# Patient Record
Sex: Male | Born: 1940 | Race: White | Hispanic: No | Marital: Single | Smoking: Current every day smoker
Health system: Southern US, Community
[De-identification: ages and names within clinical notes are randomized; demographics above are authoritative.]

## PROBLEM LIST (undated history)

## (undated) DIAGNOSIS — R413 Other amnesia: Secondary | ICD-10-CM

## (undated) DIAGNOSIS — E785 Hyperlipidemia, unspecified: Secondary | ICD-10-CM

## (undated) DIAGNOSIS — R55 Syncope and collapse: Secondary | ICD-10-CM

## (undated) DIAGNOSIS — N400 Enlarged prostate without lower urinary tract symptoms: Secondary | ICD-10-CM

## (undated) DIAGNOSIS — J302 Other seasonal allergic rhinitis: Secondary | ICD-10-CM

## (undated) DIAGNOSIS — K219 Gastro-esophageal reflux disease without esophagitis: Secondary | ICD-10-CM

## (undated) DIAGNOSIS — I340 Nonrheumatic mitral (valve) insufficiency: Secondary | ICD-10-CM

## (undated) DIAGNOSIS — I639 Cerebral infarction, unspecified: Secondary | ICD-10-CM

## (undated) DIAGNOSIS — R4189 Other symptoms and signs involving cognitive functions and awareness: Secondary | ICD-10-CM

## (undated) DIAGNOSIS — R414 Neurologic neglect syndrome: Secondary | ICD-10-CM

## (undated) DIAGNOSIS — I739 Peripheral vascular disease, unspecified: Secondary | ICD-10-CM

## (undated) DIAGNOSIS — R569 Unspecified convulsions: Secondary | ICD-10-CM

## (undated) DIAGNOSIS — I482 Chronic atrial fibrillation, unspecified: Secondary | ICD-10-CM

## (undated) DIAGNOSIS — J449 Chronic obstructive pulmonary disease, unspecified: Secondary | ICD-10-CM

## (undated) DIAGNOSIS — S42309A Unspecified fracture of shaft of humerus, unspecified arm, initial encounter for closed fracture: Secondary | ICD-10-CM

## (undated) DIAGNOSIS — I10 Essential (primary) hypertension: Secondary | ICD-10-CM

## (undated) DIAGNOSIS — N189 Chronic kidney disease, unspecified: Secondary | ICD-10-CM

## (undated) HISTORY — DX: Gastro-esophageal reflux disease without esophagitis: K21.9

## (undated) HISTORY — DX: Hyperlipidemia, unspecified: E78.5

## (undated) HISTORY — DX: Syncope and collapse: R55

## (undated) HISTORY — DX: Unspecified convulsions: R56.9

## (undated) HISTORY — PX: FRACTURE SURGERY: SHX138

## (undated) HISTORY — DX: Cerebral infarction, unspecified: I63.9

## (undated) HISTORY — DX: Chronic kidney disease, unspecified: N18.9

## (undated) HISTORY — DX: Peripheral vascular disease, unspecified: I73.9

## (undated) HISTORY — DX: Unspecified fracture of shaft of humerus, unspecified arm, initial encounter for closed fracture: S42.309A

## (undated) HISTORY — DX: Chronic obstructive pulmonary disease, unspecified: J44.9

## (undated) HISTORY — DX: Other seasonal allergic rhinitis: J30.2

## (undated) HISTORY — DX: Other symptoms and signs involving cognitive functions and awareness: R41.89

## (undated) HISTORY — DX: Essential (primary) hypertension: I10

## (undated) HISTORY — DX: Benign prostatic hyperplasia without lower urinary tract symptoms: N40.0

---

## 2005-01-15 ENCOUNTER — Ambulatory Visit (HOSPITAL_COMMUNITY): Admission: RE | Admit: 2005-01-15 | Discharge: 2005-01-15 | Payer: Self-pay | Admitting: Family Medicine

## 2006-03-07 ENCOUNTER — Observation Stay (HOSPITAL_COMMUNITY): Admission: AD | Admit: 2006-03-07 | Discharge: 2006-03-08 | Payer: Self-pay | Admitting: Family Medicine

## 2006-03-07 ENCOUNTER — Ambulatory Visit: Payer: Self-pay | Admitting: Family Medicine

## 2006-10-19 HISTORY — PX: UPPER GASTROINTESTINAL ENDOSCOPY: SHX188

## 2006-10-19 HISTORY — PX: COLONOSCOPY: SHX174

## 2008-12-24 ENCOUNTER — Ambulatory Visit: Payer: Self-pay | Admitting: Family Medicine

## 2008-12-24 ENCOUNTER — Inpatient Hospital Stay (HOSPITAL_COMMUNITY): Admission: EM | Admit: 2008-12-24 | Discharge: 2008-12-27 | Payer: Self-pay | Admitting: Emergency Medicine

## 2008-12-25 ENCOUNTER — Ambulatory Visit: Payer: Self-pay | Admitting: Surgery

## 2008-12-25 ENCOUNTER — Encounter (INDEPENDENT_AMBULATORY_CARE_PROVIDER_SITE_OTHER): Payer: Self-pay | Admitting: Emergency Medicine

## 2009-01-02 ENCOUNTER — Encounter (INDEPENDENT_AMBULATORY_CARE_PROVIDER_SITE_OTHER): Payer: Self-pay | Admitting: Family Medicine

## 2009-03-22 ENCOUNTER — Encounter: Payer: Self-pay | Admitting: Family Medicine

## 2009-04-18 ENCOUNTER — Encounter: Payer: Self-pay | Admitting: Family Medicine

## 2010-09-15 ENCOUNTER — Inpatient Hospital Stay (HOSPITAL_COMMUNITY)
Admission: EM | Admit: 2010-09-15 | Discharge: 2010-09-19 | Payer: Self-pay | Source: Home / Self Care | Admitting: Emergency Medicine

## 2010-09-16 ENCOUNTER — Ambulatory Visit: Payer: Self-pay | Admitting: Family Medicine

## 2010-09-16 ENCOUNTER — Ambulatory Visit: Payer: Self-pay | Admitting: Cardiology

## 2010-09-17 ENCOUNTER — Encounter: Payer: Self-pay | Admitting: Family Medicine

## 2010-09-18 ENCOUNTER — Encounter: Payer: Self-pay | Admitting: Family Medicine

## 2010-09-19 ENCOUNTER — Encounter: Payer: Self-pay | Admitting: Internal Medicine

## 2010-09-25 ENCOUNTER — Ambulatory Visit: Payer: Self-pay | Admitting: Internal Medicine

## 2010-10-19 ENCOUNTER — Encounter: Payer: Self-pay | Admitting: Internal Medicine

## 2010-10-25 ENCOUNTER — Inpatient Hospital Stay: Payer: Self-pay | Admitting: Internal Medicine

## 2010-11-19 ENCOUNTER — Encounter: Payer: Self-pay | Admitting: Internal Medicine

## 2010-11-22 ENCOUNTER — Inpatient Hospital Stay: Payer: Self-pay | Admitting: *Deleted

## 2010-12-29 LAB — COMPREHENSIVE METABOLIC PANEL
ALT: 16 U/L (ref 0–53)
CO2: 27 mEq/L (ref 19–32)
Calcium: 8.3 mg/dL — ABNORMAL LOW (ref 8.4–10.5)
Creatinine, Ser: 1.02 mg/dL (ref 0.4–1.5)
GFR calc non Af Amer: >60 mL/min (ref >60)
Glucose, Bld: 84 mg/dL (ref 70–99)
Sodium: 137 mEq/L (ref 135–145)
Total Bilirubin: 0.7 mg/dL (ref 0.3–1.2)

## 2010-12-30 LAB — LIPID PANEL
LDL Cholesterol: 75 mg/dL (ref 0–99)
Total CHOL/HDL Ratio: 3.8 RATIO
VLDL: 22 mg/dL (ref 0–40)

## 2010-12-30 LAB — HEMOGLOBIN A1C: Mean Plasma Glucose: 94 mg/dL (ref <117)

## 2010-12-30 LAB — PHOSPHORUS: Phosphorus: 2.5 mg/dL (ref 2.3–4.6)

## 2010-12-30 LAB — HEPATIC FUNCTION PANEL
Albumin: 3.3 g/dL — ABNORMAL LOW (ref 3.5–5.2)
Alkaline Phosphatase: 42 U/L (ref 39–117)
Indirect Bilirubin: 0.9 mg/dL (ref 0.3–0.9)
Total Protein: 6.1 g/dL (ref 6.0–8.3)

## 2010-12-30 LAB — DRUGS OF ABUSE SCREEN W/O ALC, ROUTINE URINE
Amphetamine Screen, Ur: NEGATIVE
Benzodiazepines.: NEGATIVE
Methadone: NEGATIVE
Opiate Screen, Urine: NEGATIVE
Phencyclidine (PCP): NEGATIVE
Propoxyphene: NEGATIVE

## 2010-12-30 LAB — DIFFERENTIAL
Lymphocytes Relative: 16 % (ref 12–46)
Lymphs Abs: 1.1 10*3/uL (ref 0.7–4.0)
Monocytes Absolute: 0.7 10*3/uL (ref 0.1–1.0)
Monocytes Relative: 10 % (ref 3–12)
Neutro Abs: 5 10*3/uL (ref 1.7–7.7)

## 2010-12-30 LAB — CK TOTAL AND CKMB (NOT AT ARMC)
CK, MB: 4.6 ng/mL — ABNORMAL HIGH (ref 0.3–4.0)
Relative Index: 1.3 (ref 0.0–2.5)
Total CK: 349 U/L — ABNORMAL HIGH (ref 7–232)

## 2010-12-30 LAB — CBC
HCT: 44.4 % (ref 39.0–52.0)
HCT: 46.6 % (ref 39.0–52.0)
Hemoglobin: 16.1 g/dL (ref 13.0–17.0)
MCH: 34.3 pg — ABNORMAL HIGH (ref 26.0–34.0)
MCHC: 36.3 g/dL — ABNORMAL HIGH (ref 30.0–36.0)
RDW: 12.7 % (ref 11.5–15.5)
RDW: 12.7 % (ref 11.5–15.5)
WBC: 6.9 10*3/uL (ref 4.0–10.5)

## 2010-12-30 LAB — BASIC METABOLIC PANEL
BUN: 11 mg/dL (ref 6–23)
BUN: 5 mg/dL — ABNORMAL LOW (ref 6–23)
CO2: 21 mEq/L (ref 19–32)
Calcium: 8.1 mg/dL — ABNORMAL LOW (ref 8.4–10.5)
Creatinine, Ser: 1.05 mg/dL (ref 0.4–1.5)
GFR calc Af Amer: >60 mL/min (ref >60)
GFR calc non Af Amer: >60 mL/min (ref >60)
GFR calc non Af Amer: >60 mL/min (ref >60)
Glucose, Bld: 71 mg/dL (ref 70–99)
Potassium: 3.5 mEq/L (ref 3.5–5.1)
Sodium: 140 mEq/L (ref 135–145)

## 2010-12-30 LAB — URINE MICROSCOPIC-ADD ON

## 2010-12-30 LAB — COMPREHENSIVE METABOLIC PANEL
Albumin: 3.8 g/dL (ref 3.5–5.2)
Alkaline Phosphatase: 47 U/L (ref 39–117)
BUN: 11 mg/dL (ref 6–23)
Calcium: 8.8 mg/dL (ref 8.4–10.5)
Creatinine, Ser: 1.23 mg/dL (ref 0.4–1.5)
Potassium: 3.3 mEq/L — ABNORMAL LOW (ref 3.5–5.1)
Total Protein: 6.7 g/dL (ref 6.0–8.3)

## 2010-12-30 LAB — RPR: RPR Ser Ql: NONREACTIVE

## 2010-12-30 LAB — VITAMIN B12: Vitamin B-12: 185 pg/mL — ABNORMAL LOW (ref 211–911)

## 2010-12-30 LAB — TSH: TSH: 1.148 u[IU]/mL (ref 0.350–4.500)

## 2010-12-30 LAB — FOLATE RBC: RBC Folate: 339 ng/mL (ref 180–600)

## 2010-12-30 LAB — URINALYSIS, ROUTINE W REFLEX MICROSCOPIC
Glucose, UA: NEGATIVE mg/dL
Leukocytes, UA: NEGATIVE
Specific Gravity, Urine: 1.024 (ref 1.005–1.030)

## 2010-12-30 LAB — PREALBUMIN: Prealbumin: 14 mg/dL — ABNORMAL LOW (ref 18.0–45.0)

## 2010-12-30 LAB — MAGNESIUM: Magnesium: 1.9 mg/dL (ref 1.5–2.5)

## 2011-01-29 LAB — COMPREHENSIVE METABOLIC PANEL
Albumin: 3.8 g/dL (ref 3.5–5.2)
BUN: 6 mg/dL (ref 6–23)
Calcium: 8.6 mg/dL (ref 8.4–10.5)
Glucose, Bld: 74 mg/dL (ref 70–99)
Potassium: 3.8 mEq/L (ref 3.5–5.1)
Sodium: 132 mEq/L — ABNORMAL LOW (ref 135–145)
Total Protein: 6.7 g/dL (ref 6.0–8.3)

## 2011-01-29 LAB — BASIC METABOLIC PANEL
BUN: 9 mg/dL (ref 6–23)
CO2: 24 mEq/L (ref 19–32)
CO2: 27 mEq/L (ref 19–32)
Calcium: 8.3 mg/dL — ABNORMAL LOW (ref 8.4–10.5)
Chloride: 104 mEq/L (ref 96–112)
Creatinine, Ser: 1.38 mg/dL (ref 0.4–1.5)
GFR calc Af Amer: >60 mL/min (ref >60)
GFR calc non Af Amer: 51 mL/min — ABNORMAL LOW (ref >60)
GFR calc non Af Amer: 51 mL/min — ABNORMAL LOW (ref >60)
Glucose, Bld: 139 mg/dL — ABNORMAL HIGH (ref 70–99)
Potassium: 3.4 mEq/L — ABNORMAL LOW (ref 3.5–5.1)
Sodium: 133 mEq/L — ABNORMAL LOW (ref 135–145)

## 2011-01-29 LAB — CBC
HCT: 52.4 % — ABNORMAL HIGH (ref 39.0–52.0)
Hemoglobin: 18.9 g/dL — ABNORMAL HIGH (ref 13.0–17.0)
MCHC: 35.6 g/dL (ref 30.0–36.0)
MCHC: 35.8 g/dL (ref 30.0–36.0)
MCHC: 36 g/dL (ref 30.0–36.0)
MCV: 97.7 fL (ref 78.0–100.0)
Platelets: 128 10*3/uL — ABNORMAL LOW (ref 150–400)
RBC: 4.46 MIL/uL (ref 4.22–5.81)
RBC: 4.86 MIL/uL (ref 4.22–5.81)
RDW: 12.8 % (ref 11.5–15.5)
RDW: 12.9 % (ref 11.5–15.5)
RDW: 13.1 % (ref 11.5–15.5)

## 2011-01-29 LAB — DIFFERENTIAL
Lymphs Abs: 1.2 10*3/uL (ref 0.7–4.0)
Monocytes Absolute: 0.4 10*3/uL (ref 0.1–1.0)
Monocytes Relative: 7 % (ref 3–12)
Neutro Abs: 3.9 10*3/uL (ref 1.7–7.7)
Neutrophils Relative %: 70 % (ref 43–77)

## 2011-01-29 LAB — RAPID URINE DRUG SCREEN, HOSP PERFORMED
Amphetamines: NOT DETECTED
Benzodiazepines: NOT DETECTED
Cocaine: NOT DETECTED
Opiates: NOT DETECTED
Tetrahydrocannabinol: NOT DETECTED

## 2011-01-29 LAB — TSH: TSH: 1.323 u[IU]/mL (ref 0.350–4.500)

## 2011-01-29 LAB — ETHANOL: Alcohol, Ethyl (B): <5 mg/dL (ref 0–10)

## 2011-01-29 LAB — LIPID PANEL
Cholesterol: 129 mg/dL (ref 0–200)
HDL: 29 mg/dL — ABNORMAL LOW (ref >39)
LDL Cholesterol: 74 mg/dL (ref 0–99)
Triglycerides: 130 mg/dL (ref <150)

## 2011-01-29 LAB — AMYLASE: Amylase: 93 U/L (ref 27–131)

## 2011-03-03 NOTE — H&P (Signed)
Ian Townsend, Ian Townsend                 ACCOUNT NO.:  [id number]   MEDICAL RECORD NO.:  [id number]          PATIENT TYPE:  INP   LOCATION:  1845                         FACILITY:  [facility]   PHYSICIAN:  Pearlean Brownie, M.D.DATE OF BIRTH:  Aug 06, 1941   DATE OF ADMISSION:  12/24/2008  DATE OF DISCHARGE:                              HISTORY & PHYSICAL   CHIEF COMPLAINT:  Slurred speech.   PRIMARY CARE PHYSICIAN:  [place].   HISTORY OF PRESENT ILLNESS:  This is a 70 year old male with a history  of TIAs in the past, hypertension, questionable depression and anemia  who presents with a 1-day history of slurred speech and left facial  droop and drooling.  He was noted to be normal at baseline on Saturday  when his daughter saw him.  Today, at 11:30 a.m., he called his son and  could not reach him.  His son called him back about a minute later and  noted that he was speaking in a slur like he had just woken from a deep  slumber, although he states he was awake.  The daughter came to his  house and noted the same, so he was brought to the ED for evaluation.  The patient had a longstanding history of dysequilibrium since his  previous TIAs 3-years ago.  He had seven TIAs total.  Per family, he  underwent a complete workup at [facility], but there are no records  available to me on E chart.  He denies recent fevers, chills, nausea,  vomiting, diarrhea, constipation, abdominal pain, chest pain, shortness  of breath, myalgias, arthralgias, changes in urination, loss of  consciousness or syncope.  He does endorse chronic cough, but he is a  smoker and there is no acute change in that.  He also endorses chronic  dizziness.   REVIEW OF SYSTEMS:  As above, otherwise negative.   PAST MEDICAL HISTORY:  1. Anemia.  2. Hypertension.  3. 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  4. Questionable history of depression.  5. History of smoking.  6. History of ankle fracture, status post repair in 1984.   MEDICATIONS:  Please note, the patient has stopped taking all his meds  for the last year as he has not had any follow up for the last year, so  he ran out.  Previously he was on;  1. Atenolol 50 mg b.i.d.  2. Omeprazole 20 mg daily.  3. Norvasc 10 mg daily.  4. Lisinopril 40 mg daily.  5. Doxazosin 2 mg daily.   ALLERGIES:  PENICILLIN CAUSES SWELLING.   FAMILY HISTORY:  Significant for mother with stroke and diabetes.  His  father passed of a heart attack.   SOCIAL HISTORY:  The patient does smoke 6 cigars per day and has smoked  for greater than 50 years.  He is not interested in quitting.  He does  also endorse 6 beers a day and not interested in cutting back.  Denies  recreational drugs.  He lives alone in [place].  Per family, the patient  has been less interactive and they feel that  he may be depressed.   PHYSICAL EXAMINATION:  VITAL SIGNS:  Temperature 99.4, heart rate 96-  120, blood pressure 225/120 and then dropped to 181/95 on its own,  respiratory rate 20.  O2 saturation 98% on 2 liters.  GENERAL:  A Caucasian male who appears older than stated age and is  disheveled, difficult to understand with some slurring speech.  HEENT:  Moist mucous membranes.  Pupils equally round and reactive to  light.  Extraocular movements intact.  No pharyngeal erythema, edema or  exudates.  Annia Friendly which is disheveled.  NECK:  No lymphadenopathy.  CARDIOVASCULAR:  Normal S1-S2.  No murmurs, rubs or gallops.  LUNGS:  Clear to auscultation bilaterally, except slight expiratory  wheezing throughout.  ABDOMEN:  Soft, nontender, nondistended.  Normoactive bowel sounds.  No  masses, no hepatosplenomegaly noted.  EXTREMITIES:  No clubbing, cyanosis or edema.  2+ peripheral pulses.  SKIN:  No rash or jaundice noted.  NEUROLOGY:  Cranial nerves II through XII grossly intact.  Slight left  facial droop along with drooling and slurred speech which has continued  per family, but I am unsure of  baseline.  Motor function intact  bilaterally, 5/5 strength.  Sensation intact bilaterally.  Gait not  assessed.   LABORATORY DATA:  In the ER, alcohol level less than 5.  Urine drug  screen negative.  Sodium 132, potassium 3.8, chloride 49, bicarb 24, BUN  6, creatinine 1.25, glucose 74.  LFTs within normal limits, except for a  total bilirubin of 1.4 with calcium 8.6.  Hemoglobin 18.9, white blood  cells 5.6, platelets 128.  Point of care of enzymes are negative x1 in  the ER.  Chest x-ray showing no acute changes, although it does state  focal density of lateral right lung base which may represent nipple  shadow and they recommend considering further evaluation with two-view.   Head CT with stable age-related atrophy and progressive periventricular  white matter disease which is likely microvascular ischemic changes.  There is also evidence of probable lacunar infarcts in the right basal  ganglia and right thalamus.  EKG - sinus tachy with no acute ischemic  changes.  Tele at the ER showing 4 beats of V-tach.   ASSESSMENT/PLAN:  This is a 70 year old male with history of previous  transient ischemic attacks, hypertension, questionable depression and  anemia who presents with a 1-day history of slurred speech and left  facial droop.   1. Slurred speech.  The patient presents outside a window for      thrombolytics.  CT also without evidence of acute infarct.  Hold      off on MRI for now as it will not likely will change acute workup      or treatment.  We will discuss with the team in the morning.  Check      echo and carotid Dopplers.  Risk stratify with TSH and fasting      lipid panel.  Permissive hypertension for now with a systolic blood      pressure of 160-180.  Slowly restart antihypertensives as the days      go on.  Consider neurology consult in the morning.  Aspirin 325 mg      daily, beta-blocker and ACE inhibitor, all with hold parameters.      IV hydralazine as  well.  2. History of anemia.  Hemoglobin today 18.9, likely secondary to      smoking, polycythemia.  Rehydrate slowly and monitor platelets as  they are somewhat low now.  The patient does have history of      transfusions in the past for anemia and history of being on iron      therapy.  3. Depression.  Mr. Quast seems obviously depressed.  Per family, he is      anhedonic, sleeping all day, disheveled, poor effort in responses.      He has had a trial of Effexor with poor response.  The patient is      not currently interested in any anti-depressive therapy.  4. Hypertension.  Permissive hypertension for now as we rule out      cerebrovascular accident.  Last blood pressure 160-180, slowly      restart blood pressure medications p.o.  5. History of smoking.  Nicotine patch precontemplative.  6. Alcohol abuse.  No history of withdrawal.  For now, will just give      Ativan p.r.n. withdrawal.  The patient does have decreased      platelets but his liver function tests are normal and no      hepatomegaly noted today.  Monitor daily for withdrawal.  7. Fluids, electrolytes, and nutrition/gastrointestinal:  IV fluid      rehydration with D5-1/2 normal saline at 125 mL per hour overnight.      Reassess in the morning.  Bedside swallow and a heart-healthy diet.  8. Prophylaxis; Lovenox and Protonix.  9. Disposition:  Physical therapy/occupational therapy consult.      Monitor in the stepdown unit for close blood pressure follow-up and      neuro checks tonight.  Likely transfer to floor in the morning.  10.Code:  Full code if deemed reversible per his wishes.      Eustaquio Boyden, MD  Electronically Signed      Pearlean Brownie, M.D.  Electronically Signed    JG/MEDQ  D:  12/24/2008  T:  12/25/2008  Job:  [id number]

## 2011-03-03 NOTE — Discharge Summary (Signed)
Ian Townsend, Ian Townsend                 ACCOUNT NO.:  [id number]   MEDICAL RECORD NO.:  [id number]          PATIENT TYPE:  INP   LOCATION:  3728                         FACILITY:  [facility]   PHYSICIAN:  Pearlean Brownie, M.D.DATE OF BIRTH:  Mar 15, 1941   DATE OF ADMISSION:  12/24/2008  DATE OF DISCHARGE:  12/27/2008                               DISCHARGE SUMMARY   ADMISSION DIAGNOSES:  1. Slurred speech and left facial droop.  2. Hypertension.  3. Depression.  4. History of smoking.  5. Ethyl alcohol abuse.  6. Chronic kidney disease.   DISCHARGE DIAGNOSES:  1. Transient ischemic attack.  2. Hypertension.  3. Depression.  4. History of smoking.  5. Ethyl alcohol abuse.  6. Chronic kidney disease.   CONSULTS DURING ADMISSION:  None.   PROCEDURES DONE DURING ADMISSION:  The patient had a CT head without  contrast that showed stable age-related cerebral atrophy; progressive  periventricular white matter disease, which is likely microvascular  ischemic change, probable remote lacunar-type infarct in the right basal  ganglia region, which was new since the prior study in 2006.  No CT  findings for hemispheric infarction or intracranial hemorrhage.  The  patient had a chest x-ray also on the date of admission that showed no  evidence of acute pneumonia or other explanation for altered mental  status.  Focal density at the lateral right lung base, which may  represent summation shadow or even nipple shadow and small suspect  hiatal hernia.   HISTORY:  Briefly, this is a 70 year old male with a history of TIAs in  the past, hypertension, questionable depression and anemia who presented  with a 1-day history of slurred speech and left facial droop, and  drooling.  He was last noted to be normal at baseline on Saturday prior  to admission when the daughter saw him.  On the day of admission, at  11:30 a.m. the patient called his son and could not reach him.  The son  called back about a  minute later and noted that the patient was speaking  in a slur like, he had just woken from a deep sleep, although he states  that he was awake.  Daughter came to the patient's house, noted the  same, so he was brought to the ED for evaluation.  The patient had a  longstanding history of disequilibrium since his previous TIAs 3 years  ago.  He has had a total of 7 TIA.  Per the patient's family, he  underwent a complete workup at [facility], but there were no records  available to check on E-chart.  The patient denies any fevers, chills,  nausea, vomiting, diarrhea, constipation, abdominal pain, chest pain,  shortness of breath, myalgias, arthralgias, changes in urination, loss  of consciousness, or syncope.  He did endorse a chronic cough, but the  patient is a smoker and there is no acute change in that, also endorsed  chronic dizziness.   HOSPITAL COURSE:  1. Slurred speech.  The patient was out of the window for      thrombolytics CT without evidence  of an acute infarct.  We decided      hold off on the MRI and that would likely not change the acute      workup.  The patient did have carotid Dopplers that were negative      for clots or stenosis and had an echocardiogram that showed an      ejection fraction of 60% and no evidence of clot or embolic origin.      The patient was risk stratified with a TSH and fasting lipid panel;      TSH was 1.323, fasting lipid panel showed total cholesterol 129,      HDL 29, and LDL of 74.  When the patient's blood pressure was      elevated significantly to 225/128 at admission, we decided to      slowly restart his antihypertensive and allow permissive      hypertension of 160-180, has not to widen the penumbra of the      patient's infarct.  The patient was also started on aspirin 325 mg,      a beta-blocker, an ACE inhibitor, and p.r.n. hydralazine, which he      did not need.  Pressures remained nicely between in the 160s and on      the day  after admission, his left facial droop resolved.  His      slurred speech resolved significantly and he was left with some      left-sided weakness about 4/5 on the left side, 5/5 on the right      with a history of the right basilar ganglia infarct.  We attribute      this to his chronic weakness.  Physical therapy saw the patient and      felt that he was very unstable and could benefit from this tubular      rehab with home health PT.  This was arranged for the patient.      Though he did admit to smoking approximately 6 cigars per day and      drinking 6 beers per day, the patient endorsed that he was not      interested in cutting back on either.  He did not have any      withdrawal symptoms during the hospitalization.  Though, he had an      episode of confusion the night he was admitted, but then the second      night in the hospital he did fine.  2. Hypertension.  We will allow permissive hypertension with blood      pressures around 160, slowly reinstituted blood pressure      medications.  By discharge, his pressures were in the 150 systolic.      I will restart all of his blood pressure meds as he is discharged      and blood pressure control will be deferred to outpatient      management.  For history of smoking, the patient was given a      nicotine patch with alcohol abuse, there was no signs of withdrawal      throughout his admission.  He did have some tremulousness on the      first day in the hospital, but this resolved.  3. Code status.  The patient was set to be full code (as long as his      condition was deemed reversible) per his wishes.   DISCHARGE CONDITION:  Stable and improved.   DISCHARGE  DECISION:  To home.   FOLLOWUP:  Follow up will be with Dr. Lockie Pares, at the [facility], their number is [phone], the appointment will be on January 25, 2009, at 3:00 p.m.   DISCHARGE MEDICATIONS:  Aspirin 81 mg p.o. daily, prescription was given  for this;  omeprazole 20 mg p.o. daily, prescription was given for this;  Norvasc 10 mg daily, prescription was given for this; lisinopril 40 mg  daily, a prescription was given; doxazosin 2 mg daily; metoprolol 50 mg  p.o. b.i.d., prescription was given for this.  The  patient is also given a rolling walker to take home and instructed to  keep himself well hydrated and the patient will need to have his  creatinine checked at the PCPs office as an outpatient as his creatinine  on admission was 1.25 and then on first day in the hospital increased to  1.38, and then remained at 1.38 for the next day.      Rodney Langton, MD  Electronically Signed      Pearlean Brownie, M.D.  Electronically Signed    TT/MEDQ  D:  12/27/2008  T:  12/28/2008  Job:  [id number]   cc:   [facility], [place]

## 2011-03-06 NOTE — H&P (Signed)
Ian Townsend, Ian Townsend                 ACCOUNT NO.:  [id number]   MEDICAL RECORD NO.:  [id number]          PATIENT TYPE:  INP   LOCATION:  3016                         FACILITY:  [facility]   PHYSICIAN:  Dwana Curd. Para March, M.D. DATE OF BIRTH:  Jan 17, 1941   DATE OF ADMISSION:  03/07/2006  DATE OF DISCHARGE:                                HISTORY & PHYSICAL   PRIMARY CARE PHYSICIAN:  Tracey Harries, MD, at Urgent Care.   CHIEF COMPLAINT:  Decreased hemoglobin.  The patient was transferred from  Urgent Care for evaluation and treatment.   HISTORY OF PRESENT ILLNESS:  The patient is a 70 year old male with past  medical history outlined below with progressive weakness and dizziness for  approximately one year.  The patient states this all began about a year ago  when he had a series of TIAs; however, he and his family agree that his  symptoms have all increased markedly over the last two months.  He is now  perceptively weak with presyncopal symptoms with and without position  change.  He has nausea and vomiting secondary to the presyncope.  He has had  no chest pain, no shortness of breath.  He has noted no blood in his  vomitus.  He has had a BM every day or every other day.  He has noted no  blood in the stool and no dark stools.  He has had no abdominal pain, no  weight loss, no fevers, chills, or night sweats.  He went to Urgent Care  today and was found to have a hemoglobin, which had dropped from 16 at last  measurement to 6.  This drop had occurred over a one year period.  He came  to [facility] for transfusion and likely a GI workup after discharge, per  the primary MD.   PAST MEDICAL HISTORY:  1.  Hypertension.  2.  History of TIA.  3.  History of ankle fracture status post surgical repair in 1984.  4.  No history of GI bleed with no recent NSAID use and no history of colon      cancer.  5.  Possible history of depression.   SOCIAL HISTORY:  The patient smokes seven cigars a day.   He drinks three to  four beers a day.  He has no history of withdrawal and no history of DTs.  He uses no illicit drugs.  He has three children, he is divorced, and he is  a retired Paediatric nurse.   FAMILY HISTORY:  His mother is dead at age 20.  She had a CVA and she also  had diabetes.  His father died at 72 of a heart attack.   MEDICATIONS:  1.  Norvasc 10 mg p.o. daily.  2.  Lisinopril 40 mg p.o. daily.  3.  Aspirin 325 mg p.o. daily.  4.  Doxazosin 10 mg p.o. daily.  5.  Effexor 75 mg p.o. daily, which is being weaned down to 37.5 mg p.o.      daily.  6.  Atenolol 50 mg p.o. b.i.d.  He has taken  all of his morning medications.  He has not taken his second  dose of Atenolol today.   ALLERGIES:  PENICILLIN, which causes swelling.   REVIEW OF SYSTEMS:  As above, otherwise are noncontributory except for an  occasional nighttime itching.  This is generalized and is not every night.   PHYSICAL EXAMINATION:  VITAL SIGNS:  98.6, blood pressure 112/64, pulse 78,  respiratory rate 20, SpO2 97% on room air.  GENERAL:  The patient is in no apparent distress.  He is pale and non-toxic-  appearing.  He is pleasant in conversation.  He is alert and oriented.  HEENT:  Normocephalic, atraumatic.  Extraocular movements are intact.  Pupils are equally round and reactive to light.  Mucous membranes are moist.  NECK:  Supple.  No lymphadenopathy appreciated.  There is no bruit on the  carotids bilaterally.  CARDIOVASCULAR:  Regular rate and rhythm with a normal S1 and a normal S2.  He does have a systolic ejection murmur, which is 1/6 consistent with a flow  murmur.  RESPIRATORY:  Clear to auscultation bilaterally.  ABDOMEN:  Soft and nontender, nondistended, positive bowel sounds, no  masses.  EXTREMITIES:  No edema, 2+ pulses.  BACK:  No CVA tenderness.  RECTAL:  Deferred.  He was heme-negative at Urgent Care.  NEURO:  Cranial nerves II-XII are intact bilaterally.  Sensation is intact   bilaterally on the upper and lower extremities.  He has slight decrease in  right upper extremity strength compared to the left at the biceps and  triceps.  Strength is equal in the bilateral lower extremities.  I did not  test his gait because of his presyncopal sensations.   LABORATORY VALUES TODAY:  His CMET, iron labs, and type-and-cross were all  pending.  His CBC at Urgent Care had showed the following:  White count of  6, hemoglobin 6.4, hematocrit 21.4, platelets 174.   ASSESSMENT AND PLAN:  The patient is a 70 year old male with the following  problems:  1.  A significant decrease in hemoglobin:  We will type and cross him now      for four units of packed red blood cells and transfuse him with two      initially and then check a CBC.  I will check iron labs and a CMET      before he is transfused and check a hemoccult on all the stools.  If he      is stable, he may be able to be discharged soon and have an outpatient      workup.  In the meantime, we will give him Protonix b.i.d. as a      gastrointestinal source is the most likely with diverticulitis versus      arteriovenous malformation versus cancer versus peptic ulcer disease all      being in the differential.  I will go ahead and check a CBC in the a.m.      In the meantime, he needs to avoid nonsteroidal antiinflammatories and      aspirin.  We will put him on fall precautions because of his presyncope.  2.  Hypertension:  Hold his p.m. medications as his blood pressure is low,      and we will follow his blood pressure and add on to his home medications      tomorrow morning as this is appropriate.  3.  Questionable history of depression:  The patient is stable, he has no  suicidal nor homicidal ideation.  We will taper his SSR per his primary      medical doctor.  4.  Tobacco:  I will give the patient a nicotine replacement, see orders. 5.  Deep venous thrombosis prophylaxis:  Sequential compression device, the       patient should not have any type of anticoagulation right now.  6.  Diet/FEN:  As the patient is having no belly pain and is non-toxic, I      think it is reasonable to put him on a heart-healthy diet and advance      this from clears as tolerated.  I will check a CMET for his      electrolytes.  7.  Itching:  We will check a liver function panel along with a CMET.  8.  History of alcohol use:  I will monitor the patient for signs and      symptoms of withdrawal.   DISPOSITION:  Admit.  The patient is a Full Code right now.  I have ordered  for Advanced Directives to be given to the patient.  The family is aware of  this.      Dwana Curd Para March, M.D.     GSD/MEDQ  D:  03/07/2006  T:  03/07/2006  Job:  [id number]   cc:   Tracey Harries, M.D.  Fax: [phone]

## 2011-03-06 NOTE — Discharge Summary (Signed)
NAMEALEISTER, LADY NO.:  [id number]   MEDICAL RECORD NO.:  [id number]          PATIENT TYPE:  OBV   LOCATION:  3016                         FACILITY:  [facility]   PHYSICIAN:  Asencion Partridge, M.D.     DATE OF BIRTH:  Dec 30, 1940   DATE OF ADMISSION:  03/07/2006  DATE OF DISCHARGE:  03/08/2006                                 DISCHARGE SUMMARY   PROBLEM LIST:  1.  Anemia.  2.  Hypertension.  3.  History of transient ischemic attack.  4.  Questionable history of depression.  5.  History of tobacco use.  6.  Possible chronic kidney disease.   DISCHARGE MEDICATIONS:  1.  Atenolol 50 mg p.o. b.i.d.  2.  Effexor 37.5 mg p.o. daily.  3.  Nicotine patch 14 mg per 24 hours.  4.  Omeprazole 20 mg p.o. daily.  5.  Iron sulfate 325 mg p.o. daily.  6.  Colace 100 mg p.o. daily.  7.  Norvasc 10 mg p.o. daily.  8.  Lisinopril 40 mg p.o. daily.  9.  Doxazosin 2 mg p.o. daily.   Note that the patient was instructed not to take his Norvasc, lisinopril and  doxazosin until he follows up with his primary.  The patient was informed not to take aspirin, Advil, Motrin or Aleve.  He  understands this.   FAMILY HISTORY:  The patient is to follow up with Dr. Everlene Other tomorrow on the  22nd.   PROCEDURES AND DIAGNOSTIC STUDIES:  The patient was transfused 2 units of  packed red blood cells.   CONSULTANTS:  None.   ADMISSION HISTORY AND PHYSICAL:  The patient is a pleasant 70 year old male  who has a history of hypertension and TIAs, but no history of weight loss  nor previous GI bleed, who presented with increasing fatigue and presyncopal  symptoms.  He was found to have a hemoglobin of 6 at Urgent Care.  He was  also found to be heme-positive as an outpatient there.  He was admitted for  transfusion.  He was admitted for management.   HOSPITAL COURSE:  PROBLEM #1 - ANEMIA:  The patient had iron deficiency  anemia based on his ferritin of 6 and his total iron of less than 10.   He  was typed and crossed and transfused 2 units of packed red blood cells with  a good response.  His hemoglobin increased from about 6 to 8.9; it remained  stable at this level.  He felt significantly better.  He had significantly  less presyncopal symptoms and he had no documented orthostasis.  Per the  request of the primary and the patient, he will have a workup for blood loss  as an outpatient.  Note that his fecal occult blood test was negative again  in the hospital.  Given his age, it is most likely that the patient has a  slow GI loss.   PROBLEM #2 - HYPERTENSION:  The patient has a history of hypertension.  He  was continued on atenolol during this hospitalization; however, his blood  pressures did not  require additional agents with Norvasc, lisinopril and  doxazosin.  Because the patient was anemic, transfused, and soon to follow  up with his primary, it was decided that the patient should hold his calcium  channel blocker, ACE inhibitor and peripheral alpha blocker until he sees  his primary; the patient voiced understanding of this.   PROBLEM #3 - HISTORY OF TRANSIENT ISCHEMIC ATTACK:  The patient has a  history of TIA.  He had a very mild difference in strength on the upper  extremities with right upper extremity slightly less strong than the left.  He had no changes in his neurologic status during his hospitalization.  He  will be followed as an outpatient.   PROBLEM #4 - QUESTIONABLE HISTORY OF DEPRESSION:  The patient has a history  of depression and was previously treated with Effexor 75 mg p.o. daily.  Per  the primary, he wanted this to be decreased to 37.5 mg; he was given a  prescription for this.  He had no suicidal or homicidal ideation.  He did  not appear depressed during his hospitalization.   PROBLEM #5 - TOBACCO USE:  The patient smokes several cigars a day.  I  talked to him about this, especially with cessation.  He was given a  nicotine patch during  the hospitalization and he did not smoke during the  hospitalization.  He was given a prescription as listed above.  He was  encouraged to follow up with his primary physician.   PROBLEM #6 - CHRONIC KIDNEY DISEASE:  The patient had a creatinine of 1.2  during this hospitalization; this was after transfusion.  If this truly  represents the patient's baseline creatinine, then his GFR is around 65,  giving him chronic kidney disease, stage II range kidney function.  It is at  the discretion of the primary to follow this up.  I do not have records to  define his true baseline.   DISCHARGE LABORATORY DATA:  All discussed above.      Dwana Curd Para March, M.D.    ______________________________  Asencion Partridge, M.D.    GSD/MEDQ  D:  03/08/2006  T:  03/09/2006  Job:  [id number]   cc:   Tracey Harries, M.D.  Fax: [phone]

## 2011-06-11 DIAGNOSIS — K4020 Bilateral inguinal hernia, without obstruction or gangrene, not specified as recurrent: Secondary | ICD-10-CM | POA: Insufficient documentation

## 2011-09-09 IMAGING — CR DG RIBS 2V*R*
1 series · 4 of 4 positions shown · non-contrast
Comparison: none

REASON FOR EXAM: r/o rib fx
COMMENTS:

PROCEDURE:     DXR - DXR RIBS RIGHT UNILATERAL  - September 25, 2010  [DATE]
RESULT:     There are old healed fractures of the ninth, tenth and eleventh
right ribs anterolaterally. No acute rib fracture is seen. No pneumothorax
or pleural effusion is observed.

[Series 1: view not recorded · 0.17mm/px · 4 of 4 slices shown]
[im 1/4]
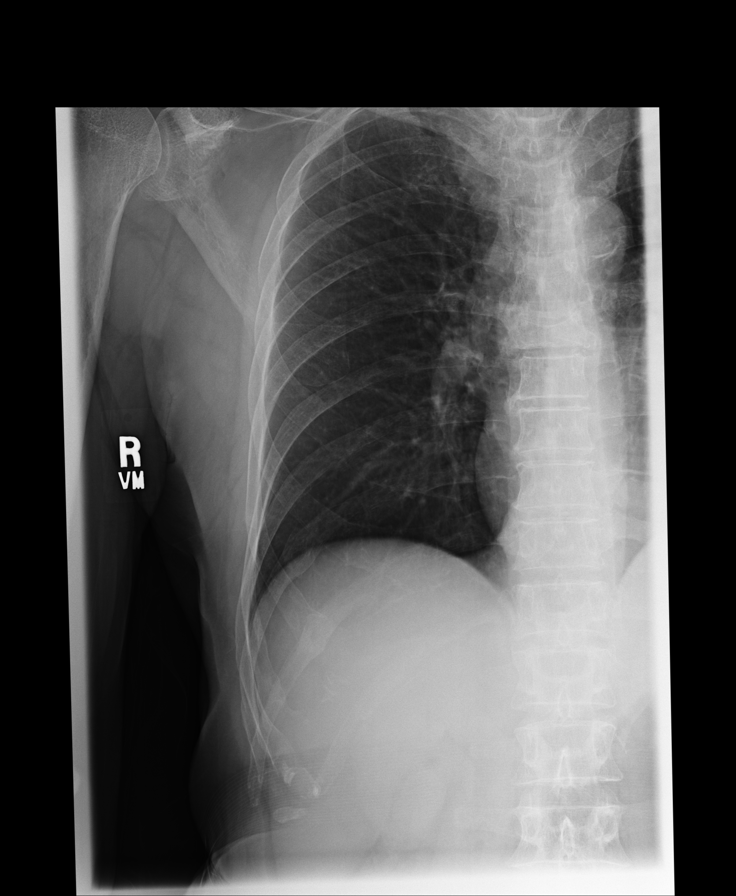
[im 2/4]
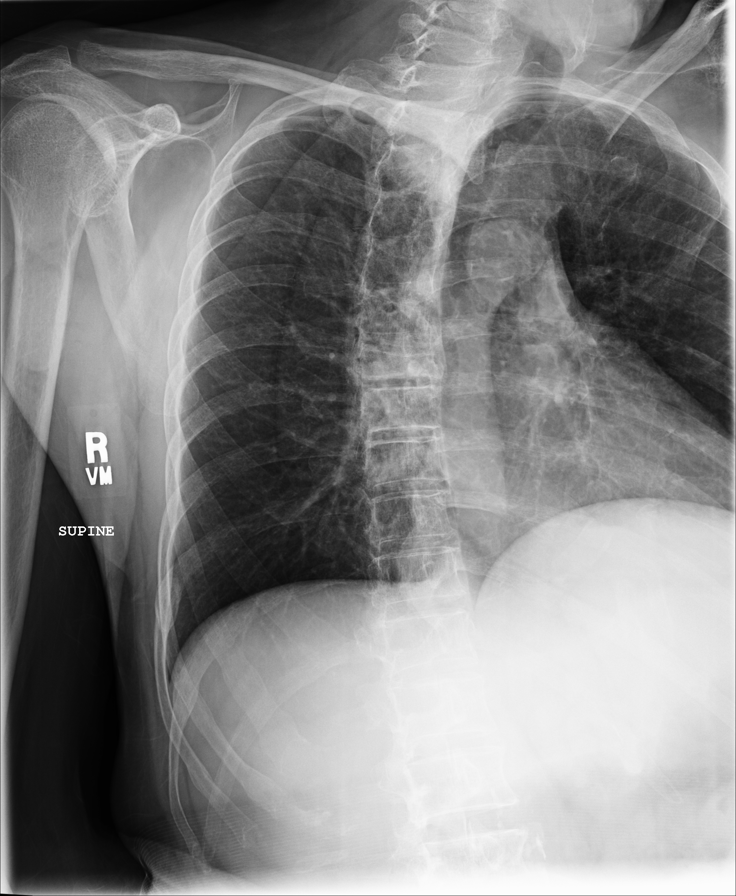
[im 3/4]
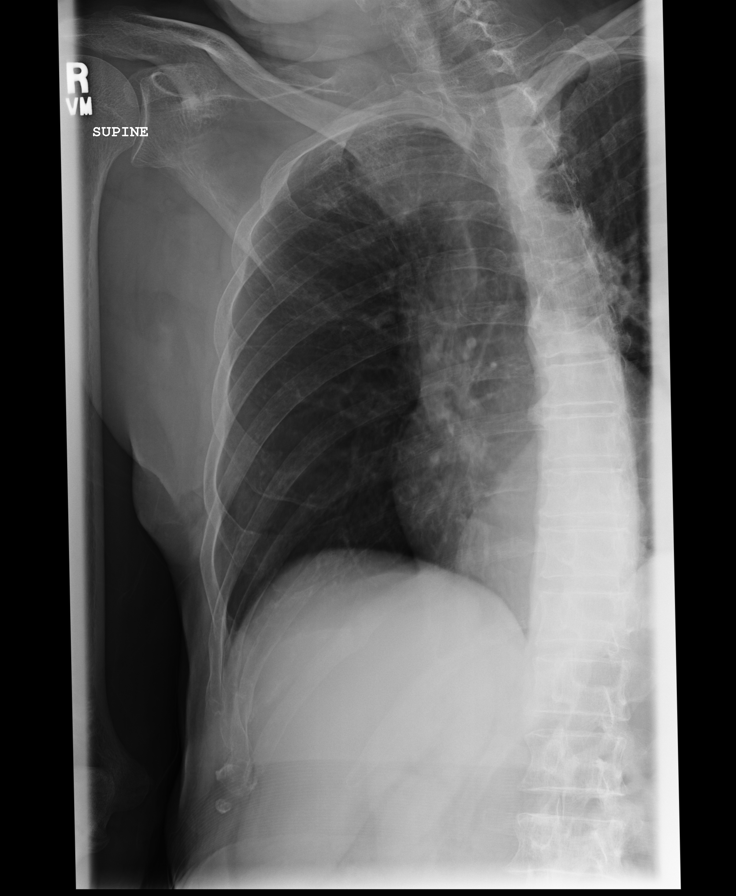
[im 4/4]
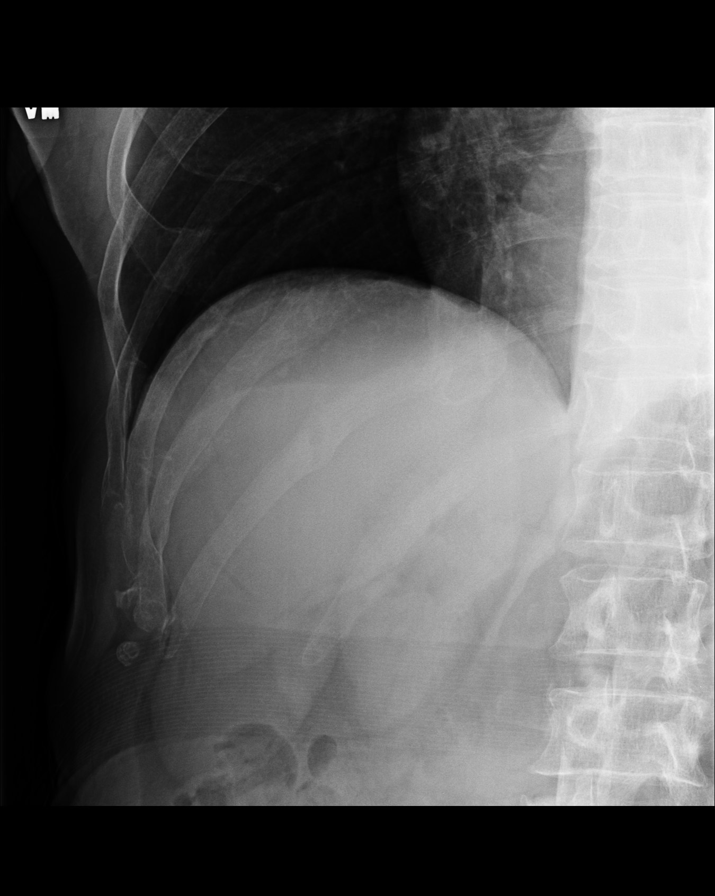

[4 of 4 positions shown; findings below may reference images not displayed]

IMPRESSION: No acute fracture is identified.

## 2011-10-27 ENCOUNTER — Ambulatory Visit: Payer: Self-pay | Admitting: Cardiovascular Disease

## 2011-12-09 ENCOUNTER — Encounter: Payer: Self-pay | Admitting: *Deleted

## 2012-02-08 ENCOUNTER — Inpatient Hospital Stay: Payer: Self-pay | Admitting: Internal Medicine

## 2012-02-08 DIAGNOSIS — R4189 Other symptoms and signs involving cognitive functions and awareness: Secondary | ICD-10-CM

## 2012-02-08 HISTORY — DX: Other symptoms and signs involving cognitive functions and awareness: R41.89

## 2012-02-08 LAB — COMPREHENSIVE METABOLIC PANEL
Albumin: 4 g/dL (ref 3.4–5.0)
Anion Gap: 14 (ref 7–16)
Chloride: 104 mmol/L (ref 98–107)
EGFR (African American): 47 — ABNORMAL LOW
Osmolality: 284 (ref 275–301)
SGOT(AST): 15 U/L (ref 15–37)
SGPT (ALT): 17 U/L

## 2012-02-08 LAB — URINALYSIS, COMPLETE
Bacteria: NONE SEEN
Bilirubin,UR: NEGATIVE
Leukocyte Esterase: NEGATIVE
Nitrite: NEGATIVE
RBC,UR: <1 /HPF (ref 0–5)
Squamous Epithelial: NONE SEEN

## 2012-02-08 LAB — CBC
HCT: 42.2 % (ref 40.0–52.0)
MCHC: 33.9 g/dL (ref 32.0–36.0)
Platelet: 152 10*3/uL (ref 150–440)
RDW: 13.9 % (ref 11.5–14.5)

## 2012-02-09 LAB — CBC WITH DIFFERENTIAL/PLATELET
Eosinophil #: 0.1 10*3/uL (ref 0.0–0.7)
Eosinophil %: 0.7 %
HCT: 38.9 % — ABNORMAL LOW (ref 40.0–52.0)
HGB: 13.6 g/dL (ref 13.0–18.0)
Lymphocyte #: 1.3 10*3/uL (ref 1.0–3.6)
Lymphocyte %: 13.6 %
MCH: 31.9 pg (ref 26.0–34.0)
MCHC: 35 g/dL (ref 32.0–36.0)
Monocyte #: 0.8 x10 3/mm (ref 0.2–1.0)
Neutrophil %: 76.5 %
RBC: 4.26 10*6/uL — ABNORMAL LOW (ref 4.40–5.90)
RDW: 13.9 % (ref 11.5–14.5)
WBC: 9.3 10*3/uL (ref 3.8–10.6)

## 2012-02-09 LAB — COMPREHENSIVE METABOLIC PANEL
Albumin: 3.5 g/dL (ref 3.4–5.0)
Alkaline Phosphatase: 63 U/L (ref 50–136)
Anion Gap: 9 (ref 7–16)
BUN: 22 mg/dL — ABNORMAL HIGH (ref 7–18)
Bilirubin,Total: 0.6 mg/dL (ref 0.2–1.0)
Chloride: 108 mmol/L — ABNORMAL HIGH (ref 98–107)
Co2: 26 mmol/L (ref 21–32)
Glucose: 98 mg/dL (ref 65–99)
Osmolality: 288 (ref 275–301)
SGOT(AST): 24 U/L (ref 15–37)
SGPT (ALT): 15 U/L
Sodium: 143 mmol/L (ref 136–145)
Total Protein: 6.4 g/dL (ref 6.4–8.2)

## 2012-02-09 LAB — LIPID PANEL
Cholesterol: 104 mg/dL (ref 0–200)
HDL Cholesterol: 57 mg/dL (ref 40–60)
Ldl Cholesterol, Calc: 34 mg/dL (ref 0–100)

## 2012-02-10 LAB — PROTIME-INR: Prothrombin Time: 14.5 secs (ref 11.5–14.7)

## 2012-02-10 LAB — PHENYTOIN LEVEL, TOTAL: Dilantin: 11.8 ug/mL (ref 10.0–20.0)

## 2012-02-10 LAB — PLATELET COUNT: Platelet: 142 10*3/uL — ABNORMAL LOW (ref 150–440)

## 2012-02-11 LAB — PROTIME-INR
INR: 1.1
Prothrombin Time: 14.8 secs — ABNORMAL HIGH (ref 11.5–14.7)

## 2012-02-11 LAB — PHENYTOIN LEVEL, TOTAL: Dilantin: 14.2 ug/mL (ref 10.0–20.0)

## 2012-02-12 LAB — PROTIME-INR
INR: 1.8
Prothrombin Time: 21.5 secs — ABNORMAL HIGH (ref 11.5–14.7)

## 2012-02-12 LAB — BASIC METABOLIC PANEL
Calcium, Total: 8.3 mg/dL — ABNORMAL LOW (ref 8.5–10.1)
Chloride: 104 mmol/L (ref 98–107)
Co2: 25 mmol/L (ref 21–32)
Creatinine: 1.7 mg/dL — ABNORMAL HIGH (ref 0.60–1.30)
EGFR (Non-African Amer.): 40 — ABNORMAL LOW
Glucose: 132 mg/dL — ABNORMAL HIGH (ref 65–99)

## 2012-02-12 LAB — PHENYTOIN LEVEL, TOTAL: Dilantin: 11.4 ug/mL (ref 10.0–20.0)

## 2012-03-16 ENCOUNTER — Encounter: Payer: Self-pay | Admitting: Cardiovascular Disease

## 2012-03-29 ENCOUNTER — Encounter: Payer: Self-pay | Admitting: Cardiovascular Disease

## 2012-03-29 ENCOUNTER — Ambulatory Visit (INDEPENDENT_AMBULATORY_CARE_PROVIDER_SITE_OTHER): Payer: Medicare Other | Admitting: Cardiovascular Disease

## 2012-03-29 VITALS — BP 130/62 | HR 73 | Ht 66.0 in | Wt 136.0 lb

## 2012-03-29 DIAGNOSIS — R55 Syncope and collapse: Secondary | ICD-10-CM | POA: Insufficient documentation

## 2012-03-29 DIAGNOSIS — I4891 Unspecified atrial fibrillation: Secondary | ICD-10-CM

## 2012-03-29 DIAGNOSIS — I495 Sick sinus syndrome: Secondary | ICD-10-CM | POA: Insufficient documentation

## 2012-03-29 DIAGNOSIS — R42 Dizziness and giddiness: Secondary | ICD-10-CM | POA: Insufficient documentation

## 2012-03-29 NOTE — Progress Notes (Signed)
Patient ID: Ian Townsend, male    DOB: [birth year], 71 y.o.   MRN: [id number]  HPI Comments: Ian Townsend is a pleasant 71 year old gentleman with history of stroke in 2011 and per the family has left side neglect, significant gait instability, admission to the hospital in April 2013 4. Of unresponsiveness while in bed overnight during which time he was making agonal breath noises, continued period of unresponsiveness for at least 30 minutes, found to be in atrial fibrillation with periods of profound bradycardia with heart rates in the 30s in the emergency room on telemetry and overnight after admission, metoprolol held with reported improvement of his heart rate, normal echocardiogram with systolic function greater than 55% he was discharged home with diagnosis of possible seizure versus syncope. He was started on antiseizure medication and is to have followup with neurology until September 2013 (in [place]).  Since his discharge, he has had 3 "falls". The first episode was after he got out of bed and felt dizzy and sat back down on the bed. Second episode he was walking from the bathroom to the bedroom and he felt dizzy and fell against the dresser. There episode, he lost consciousness after dizziness by his report and fell on the kitchen floor hurting his hip and shoulder.     He continues to have episodes of dizziness. He describes it as the "floor disappears". Symptoms wax and wane without any predictable pattern.  He reports having an episode of chest pain lasting 5-10 minutes several days ago. He has not had any further episodes since that time. No prior stress test or catheterization.  EKGs from the hospital showed atrial fibrillation with PVCs, long pauses with bradycardia EKG today shows atrial fibrillation with variable ventricular rate amount pauses up to 1.8 seconds, PVCs  Echocardiogram 02/08/2012 shows normal ejection fraction 65%, left atrium moderately dilated, right atrium moderately  dilated, moderate MR and moderate TR. Study was read by [facility]   Outpatient Encounter Prescriptions as of 03/29/2012  Medication Sig Dispense Refill  . albuterol (PROVENTIL HFA;VENTOLIN HFA) 108 (90 BASE) MCG/ACT inhaler Inhale 2 puffs into the lungs every 6 (six) hours as needed.        Marland Kitchen amLODipine (NORVASC) 5 MG tablet Take 5 mg by mouth daily.      Marland Kitchen aspirin 81 MG tablet Take 81 mg by mouth daily.        Marland Kitchen atorvastatin (LIPITOR) 40 MG tablet Take 40 mg by mouth daily.        . Dutasteride-Tamsulosin HCl (JALYN) 0.5-0.4 MG CAPS Take by mouth daily.        . Fluticasone-Salmeterol (ADVAIR DISKUS) 250-50 MCG/DOSE AEPB Inhale 1 puff into the lungs every 12 (twelve) hours.        Marland Kitchen lisinopril (PRINIVIL,ZESTRIL) 10 MG tablet Take 10 mg by mouth daily.        Marland Kitchen LORazepam (ATIVAN) 1 MG tablet Take 1 mg by mouth daily.        . metoprolol tartrate (LOPRESSOR) 25 MG tablet Take 1/2 tablet twice a day.       Marland Kitchen omeprazole (PRILOSEC) 20 MG capsule Take 20 mg by mouth daily.        . Oxybutynin Chloride (GELNIQUE) 10 % GEL Place 10 % onto the skin once.        . potassium chloride (K-DUR) 10 MEQ tablet Take 10 mEq by mouth daily.        . traMADol (ULTRAM) 50 MG tablet Take 50 mg by  mouth every 6 (six) hours as needed.        . traZODone (DESYREL) 50 MG tablet Take 50 mg by mouth at bedtime.           Review of Systems  Constitutional: Negative.   HENT: Negative.   Eyes: Negative.   Respiratory: Negative.   Cardiovascular: Negative.   Gastrointestinal: Negative.   Musculoskeletal: Positive for gait problem.  Skin: Negative.   Neurological: Positive for dizziness.       Weakness on the left, possible left side neglect  Hematological: Negative.   Psychiatric/Behavioral: Negative.   All other systems reviewed and are negative.    BP 130/62  Pulse 73  Ht 5\' 6"  (1.676 m)  Wt 136 lb (61.689 kg)  BMI 21.95 kg/m2  Physical Exam  Nursing note and vitals reviewed. Constitutional: He is  oriented to person, place, and time. He appears well-developed and well-nourished.  HENT:  Head: Normocephalic.  Nose: Nose normal.  Mouth/Throat: Oropharynx is clear and moist.  Eyes: Conjunctivae are normal. Pupils are equal, round, and reactive to light.  Neck: Normal range of motion. Neck supple. No JVD present.  Cardiovascular: Normal rate, S1 normal, S2 normal, normal heart sounds and intact distal pulses.  An irregularly irregular rhythm present. Exam reveals no gallop and no friction rub.   No murmur heard. Pulmonary/Chest: Effort normal and breath sounds normal. No respiratory distress. He has no wheezes. He has no rales. He exhibits no tenderness.  Abdominal: Soft. Bowel sounds are normal. He exhibits no distension. There is no tenderness.  Musculoskeletal: Normal range of motion. He exhibits no edema and no tenderness.  Lymphadenopathy:    He has no cervical adenopathy.  Neurological: He is alert and oriented to person, place, and time. Coordination normal.  Skin: Skin is warm and dry. No rash noted. No erythema.  Psychiatric: He has a normal mood and affect. His behavior is normal. Judgment and thought content normal.           Assessment and Plan

## 2012-03-29 NOTE — Assessment & Plan Note (Signed)
Prolonged pauses seen on EKG from the hospital and again today. This could explain his dizziness and recent period of unconsciousness. We have ordered a event monitor to determine if he needs further workup, possibly even a pacemaker.

## 2012-03-29 NOTE — Patient Instructions (Addendum)
You are doing well. No medication changes were made.  We will set you up with an event monitor to look for causes of your dizziness/syncope.  Please call us if you have new issues that need to be addressed before your next appt.  Your physician wants you to follow-up in: 6 weeks.  You will receive a reminder letter in the mail two months in advance. If you don't receive a letter, please call our office to schedule the follow-up appointment.

## 2012-03-29 NOTE — Assessment & Plan Note (Signed)
He continues to have frequent episodes of dizziness concerning for prolonged pauses and bradycardia as documented on EKG today. Event monitor pending

## 2012-03-29 NOTE — Assessment & Plan Note (Signed)
Etiology of his Recent episode of unconsciousness is still uncertain. No further episodes on antiseizure medication. No clear proof it was a seizure. He had EEG and MRI in the hospital. Certainly could have been secondary to prolonged bradycardia and poor cerebral perfusion. The event monitor pending.

## 2012-03-29 NOTE — Assessment & Plan Note (Signed)
Recent diagnosis of atrial fibrillation. Given echocardiogram showing mitral valve regurgitation and dilated left atrium, we'll not attempt cardioversion. His family does not want him on anticoagulation including warfarin. They consider him a high fall risk.

## 2012-04-13 ENCOUNTER — Telehealth: Payer: Self-pay

## 2012-04-13 NOTE — Telephone Encounter (Signed)
Rec'd communication from [employer], saying they have been trying to reach pt for monitor application but cannot get response.    I tried # provided as well.  It went to VM of Lanette (dtr). I LMTCB

## 2012-04-15 NOTE — Telephone Encounter (Signed)
LMTCB on Lynette's VM

## 2012-04-15 NOTE — Telephone Encounter (Signed)
I called pt.'s son, Ian Townsend., who is listed on alternate # list. He says sister, Ian Townsend, is POA.  She has decided pt does not need event recorder since she is "afraid he will lose hospice care". Hospice nurse comes to pt home 2x week to assist with bathing, etc. Per son,Pt wants monitor and monitor is at the home but sister will not allow brother/other family members to apply monitor as ordered by Dr. Mariah Milling. I explained, per Dr. Windell Hummingbird last note, pt may need a pacemaker which may improve his quality of life.  Son understands and is very angry with sister for not "allowing" event recorder to be applied. Son says his sister is out of town and goes out of town often for her job.  She is a Scientist, research (medical) for movie sets and is currently working on a set which is why we cannot reach her.  I explained we have tried to reach her 2x and ECardio has tried multiple times as well. Son verb. Understanding and says he has a hard time reaching her as well. I explained, ethically, this may be a situation where if pt is alert and oriented and can make his own decisions, he can decide to wear monitor as Dr. Mariah Milling suggested. Son says pt is indept, can make his own meals, stays at dtr's home alone at times, etc.  I am going to keep trying to reach Lanette, as well as discuss further with Dr. Mariah Milling. Understanding verb.

## 2012-04-15 NOTE — Telephone Encounter (Signed)
Discussed with Dr. Mariah Milling who suggests first talking with Hospice nurse to see if PPM/EM would cause pt to lose hospice services. He also suggests speaking with PCP/hospice re: cognitive abilities/whether there is a court order to rule pt incompetent, etc.

## 2012-04-15 NOTE — Telephone Encounter (Signed)
I spoke with Ian Townsend, Hospice nurse case manager. She is very familiar with pt and his situation.  She has been told by the pt's dtr that family dose not want pt to have PPM implant, if that is what needs to be done, yet pt voices that he DOES want PPM.  Nurse says monitor is at the home, sitting on a chair but pt will not allow nurse/anyone else in the home to apply the monitor unless dtr says this is ok.  Dtr remains out of town through July 4.    Nurse says she has communicated to the dtr via email in the past and suggests I try this as well ([email or web address]).  Ian Townsend says pt is admitted under hospice care for minimal assist (hx cva, left sided weakness and dizzy spells).  She says if pt rec'd PPM hospice privileges would be "revoked" but then could be reinitiated with referral easily.

## 2012-04-15 NOTE — Telephone Encounter (Signed)
Called Dr. Webb Silversmith office and spoke with his nurse, Selena Batten, about pt. She says pt is enrolled in hospice and gave me hospice's # ([phone]). I called Hospice and was given pt's hospice nurse's name/# [facility] [place], [phone]).  LMTCB on Crystal's VM

## 2012-04-15 NOTE — Telephone Encounter (Signed)
LMTCB on Ian Townsend's cell phone

## 2012-04-15 NOTE — Telephone Encounter (Signed)
I called and spoke with Katrina in Risk Management for [facility].  She says if pt is competent, can make his own decisions, POA decisions do not take precedence.  We also need to see the POA document that states what type of POA dtr is (financial/health care).  I called son back.  He gave me pt's cell phone number so I can speak with him directly.  Son warned me that pt may not hear phone/if he answers may not be able to hear me, etc.  He also says pt is not always oriented to time/situation.  He recalls a time that pt called son and sounded stressed over phone. Son asked what was wrong and pt said "World War II".  Son had to remind father WWII is over and he should not be worried.  On the other hand, pt is able to fix meals for himself, etc.  I will have to talk with Dr. Mariah Milling further.

## 2012-05-09 NOTE — Telephone Encounter (Signed)
Pt has appt at 1030 tomm. Will discuss monitor at that time since we cannot get dtr to return our calls

## 2012-05-10 ENCOUNTER — Ambulatory Visit: Payer: Medicare Other | Admitting: Cardiovascular Disease

## 2012-05-25 ENCOUNTER — Encounter: Payer: Self-pay | Admitting: Cardiovascular Disease

## 2012-08-01 ENCOUNTER — Ambulatory Visit: Payer: Medicare Other | Admitting: Cardiovascular Disease

## 2012-08-03 LAB — TROPONIN I: Troponin-I: <0.02 ng/mL

## 2012-08-03 LAB — COMPREHENSIVE METABOLIC PANEL
Albumin: 4 g/dL (ref 3.4–5.0)
Alkaline Phosphatase: 89 U/L (ref 50–136)
Calcium, Total: 8.3 mg/dL — ABNORMAL LOW (ref 8.5–10.1)
Chloride: 103 mmol/L (ref 98–107)
Co2: 19 mmol/L — ABNORMAL LOW (ref 21–32)
Creatinine: 1.76 mg/dL — ABNORMAL HIGH (ref 0.60–1.30)
EGFR (Non-African Amer.): 38 — ABNORMAL LOW
Glucose: 169 mg/dL — ABNORMAL HIGH (ref 65–99)
Osmolality: 282 (ref 275–301)
Potassium: 3.9 mmol/L (ref 3.5–5.1)
SGOT(AST): 22 U/L (ref 15–37)
SGPT (ALT): 19 U/L (ref 12–78)

## 2012-08-03 LAB — CBC
HCT: 42.4 % (ref 40.0–52.0)
MCH: 32.7 pg (ref 26.0–34.0)
Platelet: 148 10*3/uL — ABNORMAL LOW (ref 150–440)
RBC: 4.51 10*6/uL (ref 4.40–5.90)
WBC: 7.4 10*3/uL (ref 3.8–10.6)

## 2012-08-03 LAB — URINALYSIS, COMPLETE
Hyaline Cast: 1
Ketone: NEGATIVE
Nitrite: NEGATIVE
Ph: 6 (ref 4.5–8.0)
Protein: 30
RBC,UR: NONE SEEN /HPF (ref 0–5)

## 2012-08-04 ENCOUNTER — Observation Stay: Payer: Self-pay | Admitting: Internal Medicine

## 2012-08-05 ENCOUNTER — Observation Stay: Payer: Self-pay | Admitting: Internal Medicine

## 2012-08-05 LAB — URINALYSIS, COMPLETE
Blood: NEGATIVE
Hyaline Cast: 30
Leukocyte Esterase: NEGATIVE
Nitrite: NEGATIVE
Ph: 5 (ref 4.5–8.0)
Protein: 100
RBC,UR: 1 /HPF (ref 0–5)

## 2012-08-05 LAB — COMPREHENSIVE METABOLIC PANEL
Albumin: 4.1 g/dL (ref 3.4–5.0)
Alkaline Phosphatase: 95 U/L (ref 50–136)
Anion Gap: 11 (ref 7–16)
BUN: 26 mg/dL — ABNORMAL HIGH (ref 7–18)
Calcium, Total: 8.3 mg/dL — ABNORMAL LOW (ref 8.5–10.1)
Creatinine: 1.79 mg/dL — ABNORMAL HIGH (ref 0.60–1.30)
SGPT (ALT): 18 U/L (ref 12–78)
Total Protein: 7.3 g/dL (ref 6.4–8.2)

## 2012-08-05 LAB — CBC
HGB: 14.2 g/dL (ref 13.0–18.0)
MCH: 31.9 pg (ref 26.0–34.0)
MCHC: 34.3 g/dL (ref 32.0–36.0)
MCV: 93 fL (ref 80–100)
RBC: 4.46 10*6/uL (ref 4.40–5.90)
RDW: 13.4 % (ref 11.5–14.5)

## 2012-08-05 LAB — PHENYTOIN LEVEL, TOTAL: Dilantin: 11.2 ug/mL (ref 10.0–20.0)

## 2012-08-05 LAB — TROPONIN I: Troponin-I: <0.02 ng/mL

## 2012-08-06 ENCOUNTER — Ambulatory Visit: Payer: Self-pay | Admitting: Orthopedic Surgery

## 2012-08-12 ENCOUNTER — Ambulatory Visit: Payer: Medicare Other | Admitting: Cardiovascular Disease

## 2012-09-12 ENCOUNTER — Emergency Department: Payer: Self-pay | Admitting: Emergency Medicine

## 2012-09-12 LAB — CBC WITH DIFFERENTIAL/PLATELET
Basophil #: 0.1 10*3/uL (ref 0.0–0.1)
Eosinophil #: 0.1 10*3/uL (ref 0.0–0.7)
HCT: 43 % (ref 40.0–52.0)
Lymphocyte #: 0.6 10*3/uL — ABNORMAL LOW (ref 1.0–3.6)
Lymphocyte %: 5.5 %
MCHC: 35.5 g/dL (ref 32.0–36.0)
Monocyte #: 0.5 x10 3/mm (ref 0.2–1.0)
Monocyte %: 5 %
Neutrophil #: 8.8 10*3/uL — ABNORMAL HIGH (ref 1.4–6.5)
Platelet: 157 10*3/uL (ref 150–440)
RBC: 4.66 10*6/uL (ref 4.40–5.90)
RDW: 12.9 % (ref 11.5–14.5)
WBC: 10 10*3/uL (ref 3.8–10.6)

## 2012-09-12 LAB — COMPREHENSIVE METABOLIC PANEL
Alkaline Phosphatase: 153 U/L — ABNORMAL HIGH (ref 50–136)
Calcium, Total: 8.5 mg/dL (ref 8.5–10.1)
Chloride: 104 mmol/L (ref 98–107)
Co2: 29 mmol/L (ref 21–32)
EGFR (African American): 50 — ABNORMAL LOW
EGFR (Non-African Amer.): 43 — ABNORMAL LOW
SGOT(AST): 17 U/L (ref 15–37)
SGPT (ALT): 15 U/L (ref 12–78)

## 2012-09-12 LAB — APTT: Activated PTT: 25.8 secs (ref 23.6–35.9)

## 2012-09-12 LAB — URINALYSIS, COMPLETE
Bilirubin,UR: NEGATIVE
Ketone: NEGATIVE
Leukocyte Esterase: NEGATIVE
Nitrite: NEGATIVE
Ph: 7 (ref 4.5–8.0)
Protein: 100
RBC,UR: 1 /HPF (ref 0–5)
Squamous Epithelial: NONE SEEN
WBC UR: <1 /HPF (ref 0–5)

## 2012-09-12 LAB — PROTIME-INR
INR: 1
Prothrombin Time: 13.7 secs (ref 11.5–14.7)

## 2012-09-12 LAB — CK TOTAL AND CKMB (NOT AT ARMC)
CK, Total: 89 U/L (ref 35–232)
CK-MB: 1.3 ng/mL (ref 0.5–3.6)

## 2012-09-12 LAB — PHENYTOIN LEVEL, TOTAL: Dilantin: 2.8 ug/mL — ABNORMAL LOW (ref 10.0–20.0)

## 2012-09-14 ENCOUNTER — Ambulatory Visit: Payer: Self-pay | Admitting: Oncology

## 2012-09-14 LAB — T4, FREE: Free Thyroxine: 0.97 ng/dL (ref 0.76–1.46)

## 2012-09-14 LAB — CREATININE, SERUM
Creatinine: 1.32 mg/dL — ABNORMAL HIGH (ref 0.60–1.30)
EGFR (Non-African Amer.): 54 — ABNORMAL LOW

## 2012-09-14 LAB — TSH: Thyroid Stimulating Horm: 0.551 u[IU]/mL

## 2012-09-16 LAB — CEA: CEA: 3.4 ng/mL (ref 0.0–4.7)

## 2012-09-18 ENCOUNTER — Ambulatory Visit: Payer: Self-pay | Admitting: Oncology

## 2012-09-29 ENCOUNTER — Telehealth: Payer: Self-pay

## 2012-09-29 NOTE — Telephone Encounter (Signed)
Dr. Mariah Milling received correspondence from [facility] re: need for cardiac clearance for upcoming thyroidectomy. Per Dr. Mariah Milling, we need tio see pt in office before giving clearance.  He also says pt is still needing event monitor to r/o need for PPM.  This was never done as we requested multiple times. (See telephone notes from June 2013)  I attempted to reach Lanette (dtr) and had to [facility]. I also attempted to reach pt's son, Alessio, was unable to leave message on phone but was able to page son back to our #.  Will await their return call.

## 2012-10-03 ENCOUNTER — Telehealth: Payer: Self-pay

## 2012-10-03 NOTE — Telephone Encounter (Signed)
Bishop Dublin, [facility] 10/03/2012 12:23 PM Signed  Marquette Saa returning your call regarding Mr. Situ. Please call back.

## 2012-10-03 NOTE — Telephone Encounter (Signed)
[place] returning your call regarding Ian Townsend. Please call back.

## 2012-10-03 NOTE — Telephone Encounter (Signed)
I spoke with Ian Townsend who says she can have pt come in to be seen but will have to coordinate with rehab facility he is in.  Dr. Mariah Milling did not have many openings but dtr is willing to have pt see Dr. Kirke Corin 10/21/12.

## 2012-10-19 ENCOUNTER — Ambulatory Visit: Payer: Self-pay | Admitting: Oncology

## 2012-10-21 ENCOUNTER — Ambulatory Visit (INDEPENDENT_AMBULATORY_CARE_PROVIDER_SITE_OTHER): Payer: Medicare Other | Admitting: Cardiovascular Disease

## 2012-10-21 ENCOUNTER — Encounter: Payer: Self-pay | Admitting: Cardiovascular Disease

## 2012-10-21 VITALS — BP 110/52 | HR 89 | Ht 66.0 in | Wt 138.2 lb

## 2012-10-21 DIAGNOSIS — Z01810 Encounter for preprocedural cardiovascular examination: Secondary | ICD-10-CM

## 2012-10-21 DIAGNOSIS — I4891 Unspecified atrial fibrillation: Secondary | ICD-10-CM

## 2012-10-21 DIAGNOSIS — Z7189 Other specified counseling: Secondary | ICD-10-CM | POA: Insufficient documentation

## 2012-10-21 NOTE — Assessment & Plan Note (Signed)
His rate is controlled without any medication. Avoid  beta blockers, non-dihydropyridine calcium channel blockers and digoxin due to previous bradycardia.  He has been determined not to be a good candidate for anticoagulation due to recurrent falls.

## 2012-10-21 NOTE — Patient Instructions (Addendum)
You can proceed with surgery at an overall moderate cardiac risk.  Follow up with Dr. Mariah Milling in 6 months.

## 2012-10-21 NOTE — Progress Notes (Signed)
HPI  Ian Townsend is a pleasant 72 year old gentleman with history of stroke in 2011 and per the family has left side neglect, significant gait instability, admission to the hospital in April 2013 for unresponsiveness while in bed overnight during which time he was making agonal breath noises, continued period of unresponsiveness for at least 30 minutes, found to be in atrial fibrillation with periods of profound bradycardia with heart rates in the 30s in the emergency room on telemetry and overnight after admission, metoprolol held with reported improvement of his heart rate, normal echocardiogram with systolic function greater than 55% he was discharged home with diagnosis of possible seizure versus syncope. He was started on antiseizure medication.  Echocardiogram 02/08/2012 shows normal ejection fraction 65%, left atrium moderately dilated, right atrium moderately dilated, moderate MR and moderate TR. The patient has recurrent falls and is not on anticoagulation for atrial fibrillation for that reason. He has been under hospice care. 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 He needs to have surgery done and is here for preoperative cardiovascular evaluation. He denies any chest pain or significant dyspnea. He is not having bradycardia anymore. He exercises on a stationary bike.   Allergies  Allergen Reactions  . Penicillins     unknown     Current Outpatient Prescriptions on File Prior to Visit  Medication Sig Dispense Refill  . Dutasteride-Tamsulosin HCl (JALYN) 0.5-0.4 MG CAPS Take by mouth daily.        Marland Kitchen lisinopril (PRINIVIL,ZESTRIL) 10 MG tablet Take 10 mg by mouth daily.        . phenytoin (DILANTIN) 100 MG ER capsule Take 200 mg by mouth 2 (two) times daily.      . potassium chloride SA (K-DUR,KLOR-CON) 20 MEQ tablet Take 20 mEq by mouth daily.      . traMADol (ULTRAM) 50 MG tablet Take 50 mg by mouth every 6 (six) hours as needed.           Past  Medical History  Diagnosis Date  . Hyperlipidemia   . Hypertension   . Esophageal reflux   . Chronic airway obstruction, not elsewhere classified   . Seasonal allergies   . Stroke     x 2  . COPD (chronic obstructive pulmonary disease)   . History of chronic obstructive pulmonary disease   . Benign prostatic hypertrophy   . TIA (transient ischemic attack)   . Unresponsiveness 02/08/2012    patient was sent to [facility]  . Syncope   . Arrhythmia   . Paroxysmal a-fib   . Chronic kidney disease     stage III  . PVD (peripheral vascular disease)   . Broken arm     left     Past Surgical History  Procedure Date  . Colonoscopy 2008    polyps  . Upper gastrointestinal endoscopy 2008     History reviewed. No pertinent family history.   History   Social History  . Marital Status: Single    Spouse Name: N/A    Number of Children: N/A  . Years of Education: N/A   Occupational History  . Not on file.   Social History Main Topics  . Smoking status: Former Smoker -- 0.0 packs/day    Types: Cigars    Quit date: 08/30/2010  . Smokeless tobacco: Not on file  . Alcohol Use: No  . Drug Use: No  . Sexually Active:    Other Topics Concern  . Not on  file   Social History Narrative  . No narrative on file     PHYSICAL EXAM   BP 110/52  Pulse 89  Ht 5\' 6"  (1.676 m)  Wt 138 lb 4 oz (62.71 kg)  BMI 22.31 kg/m2  Constitutional: He is oriented to person, place, and time. He appears well-developed and well-nourished. No distress.  HENT: No nasal discharge.  Head: Normocephalic and atraumatic.  Eyes: Pupils are equal and round. Right eye exhibits no discharge. Left eye exhibits no discharge.  Neck: Normal range of motion. Neck supple. No JVD present. No thyromegaly present.  Cardiovascular: Normal rate, irregular rhythm, normal heart sounds and. Exam reveals no gallop and no friction rub. No murmur heard.  Pulmonary/Chest: Effort normal and breath sounds normal. No  stridor. No respiratory distress. He has no wheezes. He has no rales. He exhibits no tenderness.  Abdominal: Soft. Bowel sounds are normal. He exhibits no distension. There is no tenderness. There is no rebound and no guarding.  Musculoskeletal: Normal range of motion. He exhibits no edema and no tenderness.  Neurological: He is alert and oriented to person, place, and time. Coordination normal.  Skin: Skin is warm and dry. No rash noted. He is not diaphoretic. No erythema. No pallor.  Psychiatric: He has a normal mood and affect. His behavior is normal. Judgment and thought content normal.      EKG: Atrial fibrillation  -irregular conduction  - frequent ectopic ventricular beat s  # VECs = 4 -Possible old anterior infarct.   ABNORMAL    ASSESSMENT AND PLAN

## 2012-10-21 NOTE — Assessment & Plan Note (Signed)
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 He needs to have surgery done. He has no symptoms suggestive of angina. Ejection fraction was normal by echo. Functional capacity is somewhat poor related to previous stroke and steady gait. He did have bradycardia while he was on metoprolol but that does not seem to be the case anymore. The patient can proceed with the surgery with an overall moderate risk. I don't think performing a stress test would add much to his care. Patient and daughter both are focusing on comfort measures mostly and doing least possible testing which I think is reasonable in this situation.

## 2012-12-05 ENCOUNTER — Ambulatory Visit: Payer: Self-pay | Admitting: Unknown Physician Specialty

## 2013-01-13 ENCOUNTER — Non-Acute Institutional Stay (SKILLED_NURSING_FACILITY): Payer: Medicare Other | Admitting: Nurse Practitioner

## 2013-01-13 DIAGNOSIS — R569 Unspecified convulsions: Secondary | ICD-10-CM

## 2013-01-13 DIAGNOSIS — I6350 Cerebral infarction due to unspecified occlusion or stenosis of unspecified cerebral artery: Secondary | ICD-10-CM

## 2013-01-13 DIAGNOSIS — I639 Cerebral infarction, unspecified: Secondary | ICD-10-CM

## 2013-01-16 ENCOUNTER — Encounter: Payer: Self-pay | Admitting: Nurse Practitioner
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
## 2013-04-14 ENCOUNTER — Ambulatory Visit: Payer: Self-pay | Admitting: Unknown Physician Specialty

## 2013-05-23 ENCOUNTER — Telehealth: Payer: Self-pay

## 2013-05-23 NOTE — Telephone Encounter (Signed)
Left MOM to schedule 6 mos with dr Mariah Milling

## 2013-05-23 NOTE — Telephone Encounter (Signed)
Message copied by Coralee Rud on Tue May 23, 2013  9:45 AM ------      Message from: Oneida Arenas      Created: Tue May 23, 2013  9:10 AM      Regarding: overdue for 6 month       Can you please schedule he was due in July but there was no recall.      Thanks ------

## 2013-06-07 NOTE — Telephone Encounter (Signed)
Left msg w pt daughter to have pt to call regarding appt

## 2013-06-13 ENCOUNTER — Encounter: Payer: Self-pay | Admitting: *Deleted

## 2013-07-04 ENCOUNTER — Ambulatory Visit: Payer: Medicare Other | Admitting: General Surgery

## 2013-07-10 ENCOUNTER — Ambulatory Visit (INDEPENDENT_AMBULATORY_CARE_PROVIDER_SITE_OTHER): Payer: Medicare Other | Admitting: General Surgery

## 2013-07-10 ENCOUNTER — Encounter: Payer: Self-pay | Admitting: General Surgery

## 2013-07-10 VITALS — BP 138/72 | HR 76 | Resp 14 | Ht 66.0 in | Wt 137.0 lb

## 2013-07-10 DIAGNOSIS — K4090 Unilateral inguinal hernia, without obstruction or gangrene, not specified as recurrent: Secondary | ICD-10-CM

## 2013-07-10 DIAGNOSIS — K4020 Bilateral inguinal hernia, without obstruction or gangrene, not specified as recurrent: Secondary | ICD-10-CM

## 2013-07-10 DIAGNOSIS — R32 Unspecified urinary incontinence: Secondary | ICD-10-CM

## 2013-07-10 NOTE — Progress Notes (Signed)
Patient ID: Ian Townsend, male   DOB: [birth year], 72 y.o.   MRN: [id number]  Chief Complaint  Patient presents with  . Other    evaluation for a hernia    HPI Ian Townsend is a 72 y.o. male here today for evaluation of an hernia . Patient was seen in August 2012 for this same problem. His biggest problem was incontinence and difficulty keeping the area clean. He is now having more discomfort at the hernia site, although the family and the assisted-living facility where he is staying have done a wonderful job clearing up the dermatitis previously present in the groin area. The patient is accompanied today by his son.   HPI  Past Medical History  Diagnosis Date  . Hyperlipidemia   . Hypertension   . Esophageal reflux   . Chronic airway obstruction, not elsewhere classified   . Seasonal allergies   . Stroke     x 2  . COPD (chronic obstructive pulmonary disease)   . History of chronic obstructive pulmonary disease   . Benign prostatic hypertrophy   . TIA (transient ischemic attack)   . Unresponsiveness 02/08/2012    patient was sent to [facility]  . Syncope   . Arrhythmia   . Paroxysmal a-fib   . Chronic kidney disease     stage III  . PVD (peripheral vascular disease)   . Broken arm     left  . Seizures     Past Surgical History  Procedure Laterality Date  . Colonoscopy  2008    polyps  . Upper gastrointestinal endoscopy  2008    History reviewed. No pertinent family history.  Social History History  Substance Use Topics  . Smoking status: Former Smoker -- 0.00 packs/day for 20 years    Types: Cigars    Quit date: 08/30/2010  . Smokeless tobacco: Not on file  . Alcohol Use: No    Allergies  Allergen Reactions  . Penicillins     unknown    Current Outpatient Prescriptions  Medication Sig Dispense Refill  . acetaminophen (TYLENOL) 325 MG tablet Take 650 mg by mouth every 4 (four) hours as needed for pain or fever.       Marland Kitchen amLODipine (NORVASC) 10 MG tablet Take  5 mg by mouth daily.       Marland Kitchen aspirin 325 MG tablet Take 325 mg by mouth daily.      Marland Kitchen atorvastatin (LIPITOR) 20 MG tablet Take 20 mg by mouth daily.      . cholecalciferol (VITAMIN D) 1000 UNITS tablet Take 2,000 Units by mouth daily.      . finasteride (PROSCAR) 5 MG tablet Take 5 mg by mouth daily.      Marland Kitchen gabapentin (NEURONTIN) 100 MG capsule Take 1 capsule by mouth 2 (two) times daily.      Marland Kitchen lisinopril (PRINIVIL,ZESTRIL) 10 MG tablet Take 10 mg by mouth daily.        . phenytoin (DILANTIN) 100 MG ER capsule Take 150 mg by mouth 2 (two) times daily.       Marland Kitchen PHENYTOIN INFATABS 50 MG tablet       . polyethylene glycol (MIRALAX / GLYCOLAX) packet Take 17 g by mouth daily.      Marland Kitchen senna (SENOKOT) 8.6 MG TABS Take 1 tablet by mouth 2 (two) times daily as needed.      . solifenacin (VESICARE) 5 MG tablet Take 10 mg by mouth daily.      Marland Kitchen  tamsulosin (FLOMAX) 0.4 MG CAPS Take 0.4 mg by mouth daily.       No current facility-administered medications for this visit.    Review of Systems Review of Systems  Constitutional: Negative.   Respiratory: Negative.   Cardiovascular: Negative.     Blood pressure 138/72, pulse 76, resp. rate 14, height 5\' 6"  (1.676 m), weight 137 lb (62.143 kg).  Physical Exam Physical Exam  Constitutional: He is oriented to person, place, and time. He appears well-developed and well-nourished.  Cardiovascular: Normal rate, regular rhythm and normal heart sounds.   Occasional premature beat noted. No clinical evidence of atrial fibrillation.  Pulmonary/Chest: Effort normal and breath sounds normal. No accessory muscle usage. No respiratory distress.  Abdominal: Soft. Normal appearance and bowel sounds are normal. There is no tenderness.    Lymphadenopathy:    He has no cervical adenopathy.       Right: No inguinal adenopathy present.       Left: Inguinal adenopathy present.  Neurological: He is alert and oriented to person, place, and time.  Skin: Skin is warm  and dry.    Data Reviewed None.  Assessment    Increase of a symptomatic inguinal hernia.  Incontinence.  Compromise cardiopulmonary function.    Plan    I spoke with the patient's primary physician, Jerl Mina, M.D. He recommended cardiology evaluation due to is past history of atrial fibrillation & multiple strokes. He also requested that the patient's phenytoin and Dilantin levels be checked prior to surgery. There were no absolute contraindications to surgical intervention.      The patient and his son are well aware the patient is at high risk for surgical intervention. Possible due a Foley catheter postop to minimize urinary retention and incontinence was discussed.  To better assess the anatomy, a CT scan of the area will be obtained prior to surgery. The roll of prosthetic mesh was briefly touched on. Considering his impaired pulmonary function he would be at high risk for recurrence with a primary repair.  Patient has been scheduled for a CT abdomen/pelvis with contrast at [facility] for 07-13-13 at 3 pm (arrive 2:45 pm). Prep: no solids 4 hours prior but patient may have clear liquids up until exam time, pick up prep kit, and take medication list. Patient verbalizes understanding.  Patient's daughter and POA will be contacted regarding a surgery date. Patient's surgery has been scheduled for 07-28-13 at [facility]. This patient has been asked to decrease current 325 mg aspirin to 81 mg aspirin starting one week prior to procedure.  Earline Mayotte 07/11/2013, 10:19 AM

## 2013-07-10 NOTE — Patient Instructions (Addendum)
Patient has been scheduled for a CT abdomen/pelvis with contrast at [facility] for 07-13-13 at 3 pm (arrive 2:45 pm). Prep: no solids 4 hours prior but patient may have clear liquids up until exam time, pick up prep kit, and take medication list. Patient verbalizes understanding.  Patient's surgery has been scheduled for 07-28-13 at [facility]. This patient has been asked to decrease current 325 mg aspirin to 81 mg aspirin starting one week prior to procedure.

## 2013-07-11 ENCOUNTER — Telehealth: Payer: Self-pay | Admitting: *Deleted

## 2013-07-11 DIAGNOSIS — K4090 Unilateral inguinal hernia, without obstruction or gangrene, not specified as recurrent: Secondary | ICD-10-CM

## 2013-07-11 NOTE — Telephone Encounter (Signed)
Patient's daughter notified as instructed. He has seen Dr. Kirke Corin in the past and their office will be contacted for an appointment.  Patient has been scheduled for an appointment with Dr. Kirke Corin on 07-18-13 at 4:15 pm. Daughter notified.

## 2013-07-11 NOTE — Telephone Encounter (Signed)
Message copied by Nicholes Mango on Tue Jul 11, 2013  4:05 PM ------      Message from: Earline Mayotte      Created: Tue Jul 11, 2013 10:29 AM       Please notify the patient's daughter that I spoke with Dr. Delia Chimes regarding her father's upcoming surgery. He recommended that he be evaluated by a cardiologist prior to surgery. See if she has any problems. If not, arrange for assessment with [facility] cardiology. Thank you ------

## 2013-07-13 ENCOUNTER — Telehealth: Payer: Self-pay | Admitting: *Deleted

## 2013-07-13 ENCOUNTER — Ambulatory Visit: Payer: Self-pay | Admitting: General Surgery

## 2013-07-13 NOTE — Telephone Encounter (Signed)
Per Diane at [facility], patient's creatinine today was 1.7 with a GFR of 39. The radiologist does not want to give contrast and wanted to verify this was okay. Dr. Lemar Livings has approved this. A new order was put in Order Facilitator for a CT abdomen/pelvis without contrast.

## 2013-07-18 ENCOUNTER — Ambulatory Visit (INDEPENDENT_AMBULATORY_CARE_PROVIDER_SITE_OTHER): Payer: Medicare Other | Admitting: Cardiovascular Disease

## 2013-07-18 ENCOUNTER — Other Ambulatory Visit: Payer: Self-pay | Admitting: General Surgery

## 2013-07-18 ENCOUNTER — Encounter: Payer: Self-pay | Admitting: Cardiovascular Disease

## 2013-07-18 VITALS — BP 144/75 | HR 76 | Ht 66.0 in | Wt 134.8 lb

## 2013-07-18 DIAGNOSIS — K4020 Bilateral inguinal hernia, without obstruction or gangrene, not specified as recurrent: Secondary | ICD-10-CM

## 2013-07-18 DIAGNOSIS — Z01810 Encounter for preprocedural cardiovascular examination: Secondary | ICD-10-CM

## 2013-07-18 DIAGNOSIS — I4891 Unspecified atrial fibrillation: Secondary | ICD-10-CM

## 2013-07-18 NOTE — Patient Instructions (Addendum)
You are at moderate risk for surgery.  Continue same medications. Aspirin can be stopped 5 days before the surgery if desired.   Your physician wants you to follow-up in: 6 months.  You will receive a reminder letter in the mail two months in advance. If you don't receive a letter, please call our office to schedule the follow-up appointment.

## 2013-07-18 NOTE — Assessment & Plan Note (Signed)
He has no symptoms suggestive of angina. Ejection fraction was normal by echo last year. Functional capacity is somewhat poor related to previous stroke and steady gait. He did have bradycardia while he was on metoprolol but that does not seem to be the case anymore. His cardiac status has been stable since last year.  The patient can proceed with the surgery with an overall moderate risk. I don't think performing a stress test would add much to his care.  Aspirin can be stopped 5 days before the surgery if desired.

## 2013-07-18 NOTE — Progress Notes (Signed)
HPI  Ian Townsend is a  72 year old gentleman with history of stroke in 2011with left side neglect, significant gait instability, admission to the hospital in April 2013 for unresponsiveness while in bed overnight during which time he was making agonal breath noises, continued period of unresponsiveness for at least 30 minutes, found to be in atrial fibrillation with periods of profound bradycardia with heart rates in the 30s in the emergency room on telemetry and overnight after admission, metoprolol held with reported improvement of his heart rate, normal echocardiogram with systolic function greater than 55%.  he was discharged home with diagnosis of possible seizure versus syncope. He was started on antiseizure medication.  Echocardiogram 02/08/2012 shows normal ejection fraction 65%, left atrium moderately dilated, right atrium moderately dilated, moderate MR and moderate TR. The patient has recurrent falls and is not on anticoagulation for atrial fibrillation for that reason.  He has a large inguinal hernia and knee surgery done. He denies any chest pain or worsening dyspnea. No palpitations or syncope. Functional capacity is limited.   Allergies  Allergen Reactions  . Penicillins     unknown     Current Outpatient Prescriptions on File Prior to Visit  Medication Sig Dispense Refill  . acetaminophen (TYLENOL) 325 MG tablet Take 650 mg by mouth every 4 (four) hours as needed for pain or fever.       Marland Kitchen aspirin 325 MG tablet Take 325 mg by mouth daily.      Marland Kitchen atorvastatin (LIPITOR) 20 MG tablet Take 20 mg by mouth daily.      . finasteride (PROSCAR) 5 MG tablet Take 5 mg by mouth daily.      Marland Kitchen gabapentin (NEURONTIN) 100 MG capsule Take 1 capsule by mouth 2 (two) times daily.      Marland Kitchen lisinopril (PRINIVIL,ZESTRIL) 10 MG tablet Take 10 mg by mouth daily.        . phenytoin (DILANTIN) 100 MG ER capsule Take 150 mg by mouth 2 (two) times daily.       Marland Kitchen PHENYTOIN INFATABS 50 MG tablet       .  polyethylene glycol (MIRALAX / GLYCOLAX) packet Take 17 g by mouth daily.      Marland Kitchen senna (SENOKOT) 8.6 MG TABS Take 1 tablet by mouth 2 (two) times daily as needed.      . tamsulosin (FLOMAX) 0.4 MG CAPS Take 0.4 mg by mouth daily.       No current facility-administered medications on file prior to visit.     Past Medical History  Diagnosis Date  . Hyperlipidemia   . Hypertension   . Esophageal reflux   . Chronic airway obstruction, not elsewhere classified   . Seasonal allergies   . Stroke     x 2  . COPD (chronic obstructive pulmonary disease)   . History of chronic obstructive pulmonary disease   . Benign prostatic hypertrophy   . TIA (transient ischemic attack)   . Unresponsiveness 02/08/2012    patient was sent to [facility]  . Syncope   . Arrhythmia   . Paroxysmal a-fib   . Chronic kidney disease     stage III  . PVD (peripheral vascular disease)   . Broken arm     left  . Seizures      Past Surgical History  Procedure Laterality Date  . Colonoscopy  2008    polyps  . Upper gastrointestinal endoscopy  2008     Family History  Problem Relation Age of Onset  .  Family history unknown: Yes     History   Social History  . Marital Status: Single    Spouse Name: N/A    Number of Children: N/A  . Years of Education: N/A   Occupational History  . Not on file.   Social History Main Topics  . Smoking status: Former Smoker -- 0.00 packs/day for 20 years    Types: Cigars    Quit date: 08/30/2010  . Smokeless tobacco: Not on file  . Alcohol Use: No  . Drug Use: No  . Sexual Activity: Not on file   Other Topics Concern  . Not on file   Social History Narrative  . No narrative on file     PHYSICAL EXAM   BP 144/75  Pulse 76  Ht 5\' 6"  (1.676 m)  Wt 134 lb 12 oz (61.122 kg)  BMI 21.76 kg/m2  Constitutional: He is oriented to person, place, and time. He appears well-developed and well-nourished. No distress.  HENT: No nasal discharge.  Head:  Normocephalic and atraumatic.  Eyes: Pupils are equal and round. Right eye exhibits no discharge. Left eye exhibits no discharge.  Neck: Normal range of motion. Neck supple. No JVD present. No thyromegaly present.  Cardiovascular: Normal rate, irregular rhythm, normal heart sounds and. Exam reveals no gallop and no friction rub. No murmur heard.  Pulmonary/Chest: Effort normal and breath sounds normal. No stridor. No respiratory distress. He has no wheezes. He has no rales. He exhibits no tenderness.  Abdominal: Soft. Bowel sounds are normal. He exhibits no distension. There is no tenderness. There is no rebound and no guarding.  Musculoskeletal: Normal range of motion. He exhibits no edema and no tenderness.  Neurological: He is alert and oriented to person, place, and time. Coordination normal.  Skin: Skin is warm and dry. No rash noted. He is not diaphoretic. No erythema. No pallor.  Psychiatric: He has a normal mood and affect. His behavior is normal. Judgment and thought content normal.      EKG:  Atrial fibrillation with ventricular rate of 76 beats per minute with PVCs  ABNORMAL    ASSESSMENT AND PLAN
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
## 2013-07-19 ENCOUNTER — Encounter: Payer: Self-pay | Admitting: General Surgery

## 2013-07-19 ENCOUNTER — Ambulatory Visit: Payer: Self-pay | Admitting: General Surgery

## 2013-07-19 LAB — LAB REPORT - SCANNED
BUN: 23
CO2: 30 mmol/L — AB (ref 13–22)
Calcium: 8.4 mg/dL — AB (ref 8.7–10.7)
Chloride: 105 mmol/L (ref 99–108)
Creatinine: 1.5 mg/dL — AB (ref 0.6–1.3)
Glucose: 97 mg/dL
HCT: 41.6
Hgb: 14.5
MCH: 33.1 pg (ref 26.0–34.0)
MCHC: 35 g/dL (ref 30–37)
MCV: 95 fL (ref 82.0–108.0)
Platelets: 132
WBC: 4.9 10^3/mL

## 2013-07-19 LAB — BASIC METABOLIC PANEL
BUN: 23 mg/dL — ABNORMAL HIGH (ref 7–18)
Chloride: 105 mmol/L (ref 98–107)
Creatinine: 1.51 mg/dL — ABNORMAL HIGH (ref 0.60–1.30)
EGFR (African American): 53 — ABNORMAL LOW
EGFR (Non-African Amer.): 45 — ABNORMAL LOW
Glucose: 97 mg/dL (ref 65–99)
Potassium: 3.8 mmol/L (ref 3.5–5.1)

## 2013-07-19 LAB — CBC WITH DIFFERENTIAL/PLATELET
Basophil %: 1.1 %
Eosinophil #: 0.1 10*3/uL (ref 0.0–0.7)
Eosinophil %: 3 %
HGB: 14.5 g/dL (ref 13.0–18.0)
Lymphocyte #: 1 10*3/uL (ref 1.0–3.6)
MCH: 33.1 pg (ref 26.0–34.0)
MCHC: 34.8 g/dL (ref 32.0–36.0)
MCV: 95 fL (ref 80–100)
Monocyte #: 0.4 x10 3/mm (ref 0.2–1.0)
Monocyte %: 8.4 %
Neutrophil %: 67 %
Platelet: 132 10*3/uL — ABNORMAL LOW (ref 150–440)
RBC: 4.37 10*6/uL — ABNORMAL LOW (ref 4.40–5.90)
RDW: 13.9 % (ref 11.5–14.5)
WBC: 4.9 10*3/uL (ref 3.8–10.6)

## 2013-07-20 ENCOUNTER — Encounter: Payer: Self-pay | Admitting: General Surgery

## 2013-07-27 ENCOUNTER — Telehealth: Payer: Self-pay

## 2013-07-27 NOTE — Telephone Encounter (Signed)
Ian Townsend called and wanted to speak with you regarding her fathers surgery and concerns about post surgical after care requirements, due to patient being in a long term care facility.

## 2013-07-27 NOTE — Telephone Encounter (Signed)
I talked with [name] (daughter [place]) about Ian Townsend upcoming surgery.  She states that he did not qualify for medicaid and that [facility] was saying he could not stay there anymore she said they was " going to take him to the homeless shelter". She was asking if he could go to a rehabilitation center postoperatively, I told her that the surgery was approved by Medicare as an outpatient procedure and that I wasn't sure. I did talk with Marisue Humble regarding this. I mentioned to check with his case worker and/or Child psychotherapist at [employer]. [name] will call us back if she has to cancel surgery.

## 2013-07-28 ENCOUNTER — Ambulatory Visit: Payer: Self-pay | Admitting: General Surgery

## 2013-07-28 DIAGNOSIS — K436 Other and unspecified ventral hernia with obstruction, without gangrene: Secondary | ICD-10-CM

## 2013-07-28 DIAGNOSIS — K4090 Unilateral inguinal hernia, without obstruction or gangrene, not specified as recurrent: Secondary | ICD-10-CM

## 2013-07-28 HISTORY — PX: HERNIA REPAIR: SHX51

## 2013-07-31 ENCOUNTER — Telehealth: Payer: Self-pay | Admitting: General Surgery

## 2013-07-31 ENCOUNTER — Encounter: Payer: Self-pay | Admitting: General Surgery

## 2013-07-31 NOTE — Telephone Encounter (Signed)
Discussed discoloration in the scrotum post bilateral hernia repair is normal. Good pain relief reported with Norco. Minimal incisional drainage. Follow up as scheduled.

## 2013-07-31 NOTE — Telephone Encounter (Signed)
Ian Townsend(SON)CALLED REGARDING HIS FATHER.HE HAD BILAT HERNIA SX ON Friday 07-28-13. HE  STATES HIS FATHERS SCUTUM IS VERY BLACK AND WANTED TO MAKE SURE THIS WAS NORMAL.

## 2013-08-09 ENCOUNTER — Ambulatory Visit: Payer: Medicare Other | Admitting: General Surgery

## 2013-08-09 ENCOUNTER — Encounter: Payer: Self-pay | Admitting: General Surgery

## 2013-08-09 ENCOUNTER — Telehealth: Payer: Self-pay | Admitting: *Deleted

## 2013-08-09 ENCOUNTER — Ambulatory Visit (INDEPENDENT_AMBULATORY_CARE_PROVIDER_SITE_OTHER): Payer: Medicare Other | Admitting: General Surgery

## 2013-08-09 VITALS — BP 140/80 | HR 64 | Resp 12 | Ht 66.0 in | Wt 134.0 lb

## 2013-08-09 DIAGNOSIS — K4020 Bilateral inguinal hernia, without obstruction or gangrene, not specified as recurrent: Secondary | ICD-10-CM

## 2013-08-09 NOTE — Telephone Encounter (Signed)
QID for 30 minutes and as desired.  They will fax a order to sign.

## 2013-08-09 NOTE — Progress Notes (Signed)
Patient ID: Ian Townsend, male   DOB: 17-Apr-1941, 72 y.o.   MRN: [id number]  Chief Complaint  Patient presents with  . Routine Post Op    bilateral inguinal hernia    HPI Ian Townsend is a 72 y.o. male who presents for a post op bilateral inguinal hernia repair. The procedure was performed on 07/28/13. The patient has no complaints at this time. He is doing well.   HPI  Past Medical History  Diagnosis Date  . Hyperlipidemia   . Hypertension   . Esophageal reflux   . Chronic airway obstruction, not elsewhere classified   . Seasonal allergies   . Stroke     x 2  . COPD (chronic obstructive pulmonary disease)   . History of chronic obstructive pulmonary disease   . Benign prostatic hypertrophy   . TIA (transient ischemic attack)   . Unresponsiveness 02/08/2012    patient was sent to [facility]  . Syncope   . Arrhythmia   . Paroxysmal a-fib   . Chronic kidney disease     stage III  . PVD (peripheral vascular disease)   . Broken arm     left  . Seizures     Past Surgical History  Procedure Laterality Date  . Colonoscopy  2008    polyps  . Upper gastrointestinal endoscopy  2008  . Hernia repair Bilateral 2014    inguinal    History reviewed. No pertinent family history.  Social History History  Substance Use Topics  . Smoking status: Former Smoker -- 0.00 packs/day for 20 years    Types: Cigars    Quit date: 08/30/2010  . Smokeless tobacco: Not on file  . Alcohol Use: No    Allergies  Allergen Reactions  . Penicillins     unknown    Current Outpatient Prescriptions  Medication Sig Dispense Refill  . acetaminophen (TYLENOL) 325 MG tablet Take 650 mg by mouth every 4 (four) hours as needed for pain or fever.       Marland Kitchen amLODipine (NORVASC) 5 MG tablet Take 5 mg by mouth daily.      Marland Kitchen aspirin 325 MG tablet Take 325 mg by mouth daily.      Marland Kitchen atorvastatin (LIPITOR) 20 MG tablet Take 20 mg by mouth daily.      . finasteride (PROSCAR) 5 MG tablet Take 5 mg by mouth  daily.      Marland Kitchen gabapentin (NEURONTIN) 100 MG capsule Take 1 capsule by mouth 2 (two) times daily.      Marland Kitchen lisinopril (PRINIVIL,ZESTRIL) 10 MG tablet Take 10 mg by mouth daily.        . Melatonin 5 MG TABS Take by mouth daily.      . miconazole (LOTRIMIN AF) 2 % powder Apply topically as needed for itching.      . phenytoin (DILANTIN) 100 MG ER capsule Take 150 mg by mouth 2 (two) times daily.       Marland Kitchen PHENYTOIN INFATABS 50 MG tablet       . polyethylene glycol (MIRALAX / GLYCOLAX) packet Take 17 g by mouth daily.      Marland Kitchen senna (SENOKOT) 8.6 MG TABS Take 1 tablet by mouth 2 (two) times daily as needed.      . solifenacin (VESICARE) 10 MG tablet Take 10 mg by mouth daily.      . tamsulosin (FLOMAX) 0.4 MG CAPS Take 0.4 mg by mouth daily.      . traMADol (ULTRAM) 50 MG  tablet Take 50 mg by mouth 3 (three) times daily as needed for pain.       No current facility-administered medications for this visit.    Review of Systems Review of Systems  Constitutional: Negative.   Respiratory: Negative.   Cardiovascular: Negative.   Gastrointestinal: Negative.     Blood pressure 140/80, pulse 64, resp. rate 12, height 5\' 6"  (1.676 m), weight 134 lb (60.782 kg).  Physical Exam Physical Exam  Constitutional: He is oriented to person, place, and time. He appears well-developed and well-nourished.  Abdominal:  The abdomen is soft and nontender.  Significant scrotal swelling bilaterally consistent with the large direct hernia sacs removed on the right side and reduced internally on the left side at the time of surgery.  Neurological: He is alert and oriented to person, place, and time.  Skin: Skin is warm and dry.    Data Reviewed None  Assessment    Doing well status post bilateral open hernia repair.     Plan    Local heat application may help resolve the significant scrotal swelling more quickly. He'll continue to uses athletic supporter for comfort.       Earline Mayotte 08/09/2013, 9:43 PM

## 2013-08-09 NOTE — Telephone Encounter (Signed)
Message copied by Currie Paris on Wed Aug 09, 2013  5:15 PM ------      Message from: Erich Montane      Created: Wed Aug 09, 2013  3:36 PM      Regarding: Patient      Contact: [phone]       Norman Herrlich called from [facility] where Mr.Pellegrino Kennard is staying and she needs to know how many times a day does the nurse need to put on the heating pad and for how long. She said her number to reach her at is [phone] ------

## 2013-08-09 NOTE — Patient Instructions (Addendum)
Patient to return in 1 month. 

## 2013-08-17 ENCOUNTER — Other Ambulatory Visit: Payer: Self-pay

## 2013-08-17 MED ORDER — TRAMADOL HCL 50 MG PO TABS: ORAL_TABLET | ORAL | Status: DC

## 2013-08-31 ENCOUNTER — Ambulatory Visit: Payer: Medicare Other | Admitting: General Surgery

## 2013-09-06 ENCOUNTER — Ambulatory Visit (INDEPENDENT_AMBULATORY_CARE_PROVIDER_SITE_OTHER): Payer: Medicare Other | Admitting: General Surgery

## 2013-09-06 ENCOUNTER — Encounter: Payer: Self-pay | Admitting: General Surgery

## 2013-09-06 VITALS — BP 140/78 | HR 64 | Resp 16 | Ht 66.0 in | Wt 133.0 lb

## 2013-09-06 DIAGNOSIS — K4020 Bilateral inguinal hernia, without obstruction or gangrene, not specified as recurrent: Secondary | ICD-10-CM

## 2013-09-06 IMAGING — CT CT NECK WITH CONTRAST
1 of 2 series · 9 of 14 positions shown, 12 images · non-contrast
Comparison: none

REASON FOR EXAM: thyroid mass
COMMENTS:

[Series 2: neck 3.0 3 · axial · 0.40mm/px · z∈[-212,+28]mm · 9 of 102 slices shown, 12 images]
[im 11/102  soft-tissue]
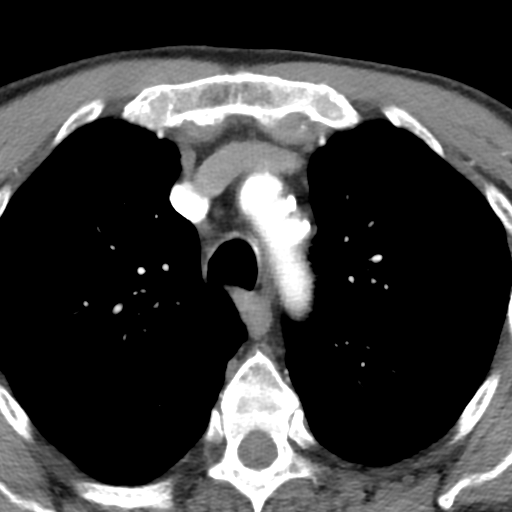
[im 11/102  bone]
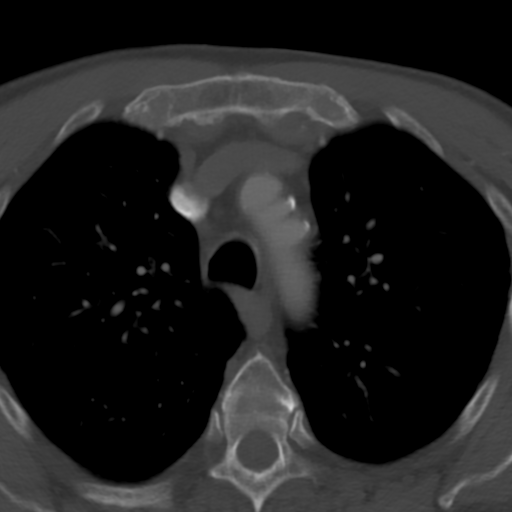
[im 21/102  bone]
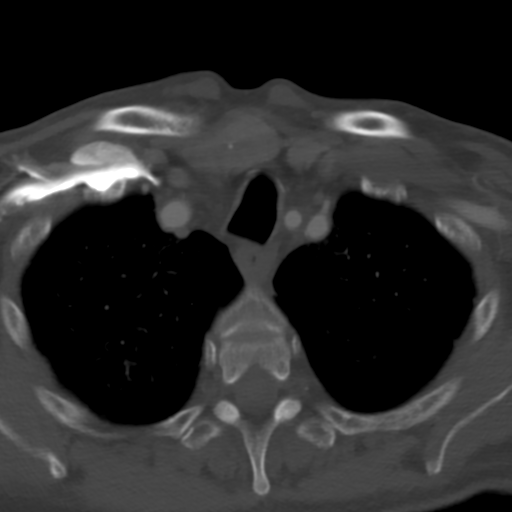
[im 31/102  bone]
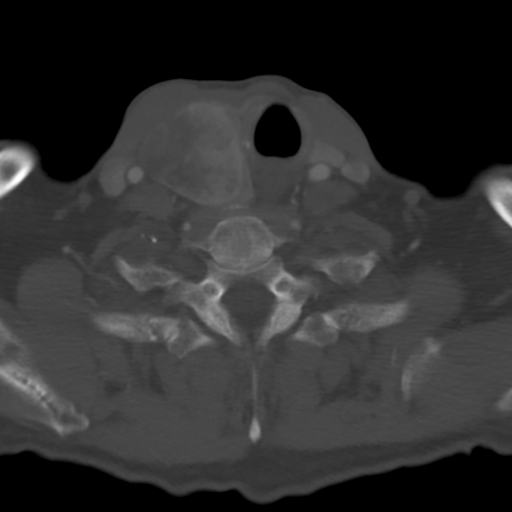
[im 41/102  bone]
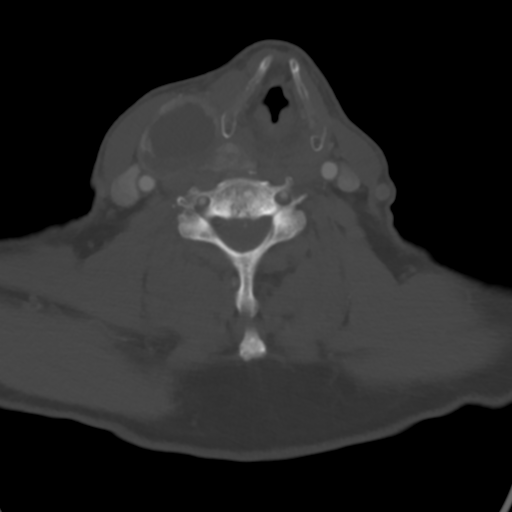
[im 51/102  soft-tissue]
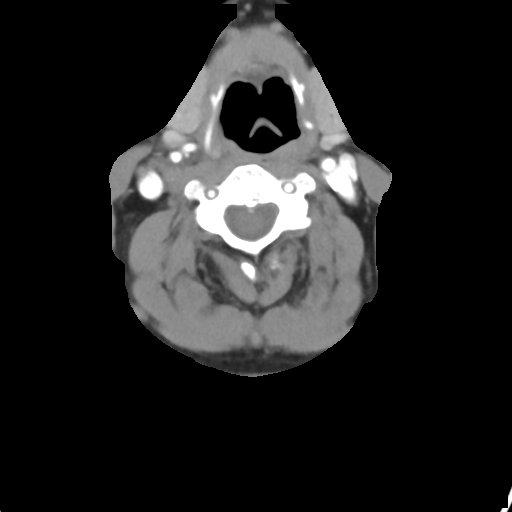
[im 51/102  bone]
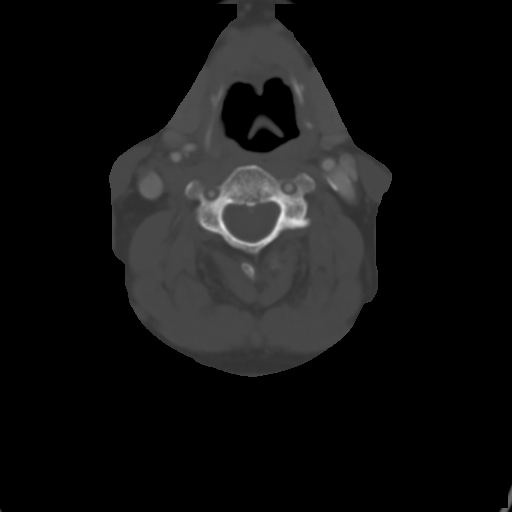
[im 61/102  bone]
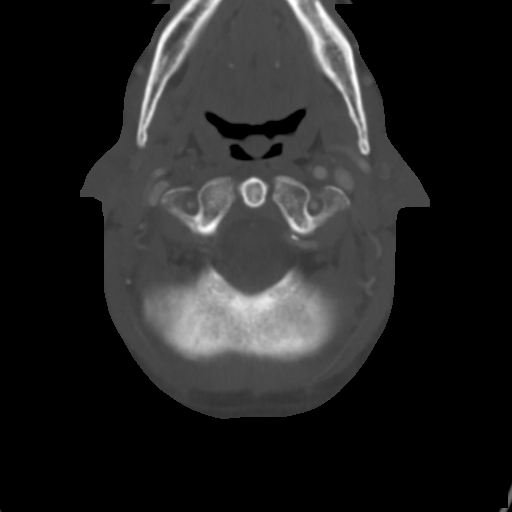
[im 71/102  bone]
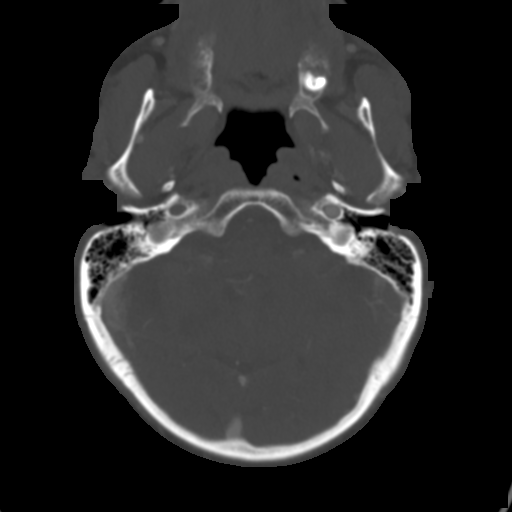
[im 81/102  bone]
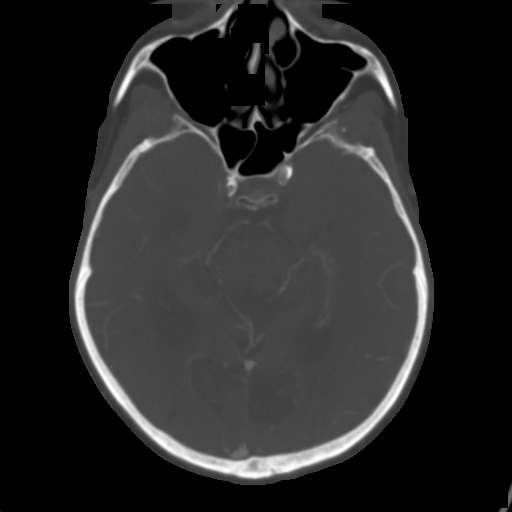
[im 91/102  soft-tissue]
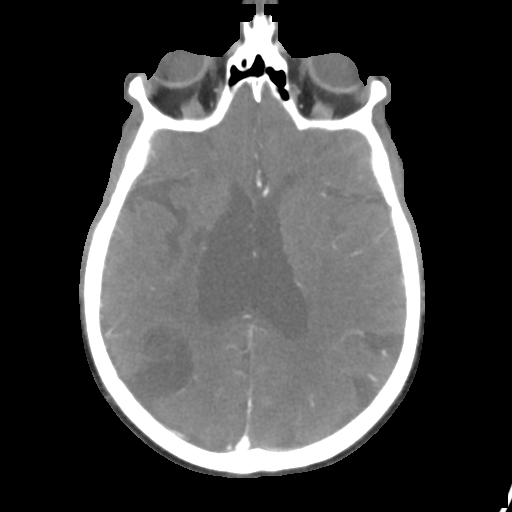
[im 91/102  bone]
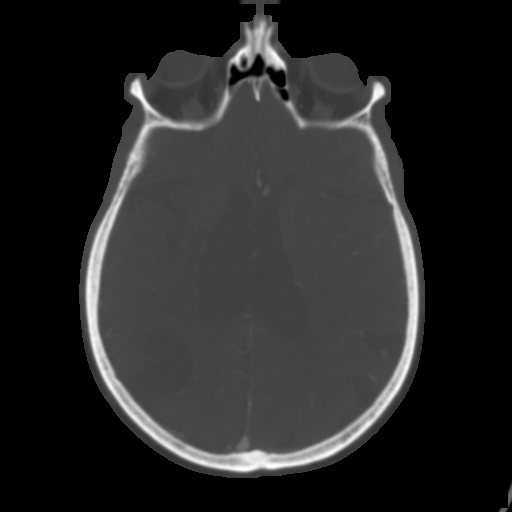

[9 of 14 positions shown; findings below may reference images not displayed]

PROCEDURE:     KCT - KCT NECK WITH CONTRAST  - September 22, 2012  [DATE]

RESULT:     Axial CT scanning was performed through the neck with
reconstructions at 3 mm intervals and slice thicknesses following
intravenous administration of 75 cc of [id number].

The right thyroid lobe is enlarged measuring 4.2 cm AP x 4.6 cm transversely
x 8.1 cm in superior to inferior dimension. It enhances heterogeneously.
There is a dominant cystic appearing component which measures 2.1 cm AP x
2.5 cm transversely x 3.8 cm in superior to inferior dimension. There is
mild shift of the trachea and esophagus to the left. The left thyroid lobe
is normal in size and exhibits a subcentimeter low density nodule. The
thyroid isthmus is enlarged measuring approximately 1.4 cm AP x 1.6 cm
transversely x 2.6 cm in superior to inferior dimension.

No pathologic sized lymph nodes are seen adjacent to the thyroid gland.
There is a small amount of substernal extension of thyroid tissue. The
jugular and carotid vessels are patent. The nasopharyngeal structures and
oral pharyngeal structures appear normal. The laryngeal structures also
appear normal. The pulmonary apices exhibit mild emphysematous changes.
IMPRESSION: 1.There is enlargement of the right thyroid lobe and thyroid isthmus. The
enhancement pattern is heterogeneous. An enlarged cystic component is
demonstrated in the right thyroid lobe. The left thyroid lobe is normal in
size.
2. There is mild displacement of the trachea toward the left.
3. There is no cervical lymphadenopathy.

[REDACTED]

## 2013-09-06 NOTE — Progress Notes (Signed)
Patient ID: Ian Townsend, male   DOB: 08-28-1941, 72 y.o.   MRN: [id number]  Chief Complaint  Patient presents with  . Routine Post Op    hernia repair    HPI Ian Townsend is a 72 y.o. male.  Here today for follow up from bilateral inguinal hernia repair 07-28-13. No new complaints. Using miralax for constipation.  Using his walker. The patient is accompanied by his son. HPI  Past Medical History  Diagnosis Date  . Hyperlipidemia   . Hypertension   . Esophageal reflux   . Chronic airway obstruction, not elsewhere classified   . Seasonal allergies   . Stroke     x 2  . COPD (chronic obstructive pulmonary disease)   . History of chronic obstructive pulmonary disease   . Benign prostatic hypertrophy   . TIA (transient ischemic attack)   . Unresponsiveness 02/08/2012    patient was sent to [facility]  . Syncope   . Arrhythmia   . Paroxysmal a-fib   . Chronic kidney disease     stage III  . PVD (peripheral vascular disease)   . Broken arm     left  . Seizures     Past Surgical History  Procedure Laterality Date  . Colonoscopy  2008    polyps  . Upper gastrointestinal endoscopy  2008  . Hernia repair Bilateral 07-28-2013    inguinal    No family history on file.  Social History History  Substance Use Topics  . Smoking status: Former Smoker -- 0.00 packs/day for 20 years    Types: Cigars    Quit date: 08/30/2010  . Smokeless tobacco: Not on file  . Alcohol Use: No    Allergies  Allergen Reactions  . Penicillins     unknown    Current Outpatient Prescriptions  Medication Sig Dispense Refill  . acetaminophen (TYLENOL) 325 MG tablet Take 650 mg by mouth every 4 (four) hours as needed for pain or fever.       Marland Kitchen amLODipine (NORVASC) 5 MG tablet Take 5 mg by mouth daily.      Marland Kitchen aspirin 325 MG tablet Take 325 mg by mouth daily.      Marland Kitchen atorvastatin (LIPITOR) 20 MG tablet Take 20 mg by mouth daily.      . finasteride (PROSCAR) 5 MG tablet Take 5 mg by mouth daily.       Marland Kitchen gabapentin (NEURONTIN) 100 MG capsule Take 1 capsule by mouth 2 (two) times daily.      Marland Kitchen HYDROcodone-acetaminophen (NORCO/VICODIN) 5-325 MG per tablet       . lisinopril (PRINIVIL,ZESTRIL) 10 MG tablet Take 10 mg by mouth daily.        . Melatonin 5 MG TABS Take by mouth daily.      . miconazole (LOTRIMIN AF) 2 % powder Apply topically as needed for itching.      . phenytoin (DILANTIN) 100 MG ER capsule Take 150 mg by mouth 2 (two) times daily.       Marland Kitchen PHENYTOIN INFATABS 50 MG tablet       . polyethylene glycol (MIRALAX / GLYCOLAX) packet Take 17 g by mouth daily.      Marland Kitchen senna (SENOKOT) 8.6 MG TABS Take 1 tablet by mouth 2 (two) times daily as needed.      . solifenacin (VESICARE) 10 MG tablet Take 10 mg by mouth daily.      . tamsulosin (FLOMAX) 0.4 MG CAPS Take 0.4 mg by mouth  daily.      . traMADol (ULTRAM) 50 MG tablet 1 by mouth three times daily as needed for MILD pain  90 tablet  1   No current facility-administered medications for this visit.    Review of Systems Review of Systems  Constitutional: Negative.   Respiratory: Negative.   Cardiovascular: Negative.   Gastrointestinal: Positive for constipation. Negative for nausea, vomiting, abdominal pain, diarrhea and abdominal distention.    Blood pressure 140/78, pulse 64, resp. rate 16, height 5\' 6"  (1.676 m), weight 133 lb (60.328 kg).  Physical Exam Physical Exam  Constitutional: He is oriented to person, place, and time. He appears well-developed and well-nourished.  Abdominal: Soft.  The incisions are well-healed. There is still significant cord thickening on the right extending from the external ring down to the testicle. The left side has almost returned to normal. The scrotum has decreased by about 60+ percent in size bilaterally. No pain or tenderness. No weakness on exam.  Neurological: He is alert and oriented to person, place, and time.  Skin: Skin is warm and dry.      Assessment    Doing well status  post hernia repair.    Plan    Followup exam in 2 months.       Earline Mayotte 09/09/2013, 8:25 AM

## 2013-09-06 NOTE — Patient Instructions (Signed)
The patient is aware to call back for any questions or concerns.  

## 2013-11-02 ENCOUNTER — Ambulatory Visit (INDEPENDENT_AMBULATORY_CARE_PROVIDER_SITE_OTHER): Payer: Medicare Other | Admitting: General Surgery

## 2013-11-02 ENCOUNTER — Encounter: Payer: Self-pay | Admitting: General Surgery

## 2013-11-02 ENCOUNTER — Telehealth: Payer: Self-pay | Admitting: General Surgery

## 2013-11-02 ENCOUNTER — Ambulatory Visit: Payer: Medicare Other | Admitting: General Surgery

## 2013-11-02 VITALS — BP 128/68 | HR 72 | Resp 16 | Ht 66.0 in | Wt 136.0 lb

## 2013-11-02 DIAGNOSIS — K4020 Bilateral inguinal hernia, without obstruction or gangrene, not specified as recurrent: Secondary | ICD-10-CM

## 2013-11-02 NOTE — Telephone Encounter (Signed)
Notified him that his father is doing well.

## 2013-11-02 NOTE — Progress Notes (Signed)
Patient ID: Ian Townsend, male   DOB: Dec 18, 1940, 73 y.o.   MRN: [id number]  Chief Complaint  Patient presents with  . Routine Post Op    hernia    HPI Ian Townsend is a 73 y.o. male.  Here today for follow up from bilateral inguinal hernia 07-28-13.  States he is doing well. Sates his appetite is good and bowels moving regular. HPI  Past Medical History  Diagnosis Date  . Hyperlipidemia   . Hypertension   . Esophageal reflux   . Chronic airway obstruction, not elsewhere classified   . Seasonal allergies   . Stroke     x 2  . COPD (chronic obstructive pulmonary disease)   . History of chronic obstructive pulmonary disease   . Benign prostatic hypertrophy   . TIA (transient ischemic attack)   . Unresponsiveness 02/08/2012    patient was sent to [facility]  . Syncope   . Arrhythmia   . Paroxysmal a-fib   . Chronic kidney disease     stage III  . PVD (peripheral vascular disease)   . Broken arm     left  . Seizures     Past Surgical History  Procedure Laterality Date  . Colonoscopy  2008    polyps  . Upper gastrointestinal endoscopy  2008  . Hernia repair Bilateral 07-28-2013    inguinal    No family history on file.  Social History History  Substance Use Topics  . Smoking status: Former Smoker -- 0.00 packs/day for 20 years    Types: Cigars    Quit date: 08/30/2010  . Smokeless tobacco: Not on file  . Alcohol Use: No    Allergies  Allergen Reactions  . Penicillins     unknown    Current Outpatient Prescriptions  Medication Sig Dispense Refill  . acetaminophen (TYLENOL) 325 MG tablet Take 650 mg by mouth every 4 (four) hours as needed for pain or fever.       Marland Kitchen amLODipine (NORVASC) 5 MG tablet Take 5 mg by mouth daily.      Marland Kitchen aspirin 325 MG tablet Take 325 mg by mouth daily.      Marland Kitchen atorvastatin (LIPITOR) 20 MG tablet Take 20 mg by mouth daily.      . finasteride (PROSCAR) 5 MG tablet Take 5 mg by mouth daily.      Marland Kitchen gabapentin (NEURONTIN) 100 MG capsule  Take 1 capsule by mouth 2 (two) times daily.      Marland Kitchen lisinopril (PRINIVIL,ZESTRIL) 10 MG tablet Take 10 mg by mouth daily.        . Melatonin 5 MG TABS Take by mouth daily.      . miconazole (LOTRIMIN AF) 2 % powder Apply topically as needed for itching.      . phenytoin (DILANTIN) 100 MG ER capsule Take 150 mg by mouth 2 (two) times daily.       Marland Kitchen PHENYTOIN INFATABS 50 MG tablet       . polyethylene glycol (MIRALAX / GLYCOLAX) packet Take 17 g by mouth daily.      Marland Kitchen senna (SENOKOT) 8.6 MG TABS Take 1 tablet by mouth 2 (two) times daily as needed.      . solifenacin (VESICARE) 10 MG tablet Take 10 mg by mouth daily.      . tamsulosin (FLOMAX) 0.4 MG CAPS Take 0.4 mg by mouth daily.      . traMADol (ULTRAM) 50 MG tablet 1 by mouth three times daily  as needed for MILD pain  90 tablet  1   No current facility-administered medications for this visit.    Review of Systems Review of Systems  Constitutional: Negative.   Respiratory: Negative.   Cardiovascular: Negative.     Blood pressure 128/68, pulse 72, resp. rate 16, height 5\' 6"  (1.676 m), weight 136 lb (61.689 kg).  Physical Exam Physical Exam  Constitutional: He is oriented to person, place, and time. He appears well-developed.  Neck: Neck supple.  Abdominal: Normal appearance. There is no tenderness.  Incisions are well-healed. No induration or thickening along the incisions.   The cord swelling on the left has completely resolved. There is significant reduction in the right cord swelling, although it still is about 2.5 cm in diameter. No tenderness. Testes are down bilaterally and unremarkable.    Neurological: He is alert and oriented to person, place, and time.  Skin: Skin is warm and dry.    Data Reviewed None.  Assessment    Doing well status post bilateral inguinal hernia repair.    Plan    Follow up will be on an as-needed basis.       [name] 11/02/2013, 10:38 AM

## 2013-11-02 NOTE — Patient Instructions (Signed)
The patient is aware to call back for any questions or concerns.  

## 2014-02-15 ENCOUNTER — Emergency Department: Payer: Self-pay | Admitting: Emergency Medicine

## 2014-02-15 LAB — CBC
HCT: 44.4 % (ref 40.0–52.0)
HGB: 15.3 g/dL (ref 13.0–18.0)
MCH: 32.8 pg (ref 26.0–34.0)
MCHC: 34.3 g/dL (ref 32.0–36.0)
MCV: 96 fL (ref 80–100)
Platelet: 146 10*3/uL — ABNORMAL LOW (ref 150–440)
RBC: 4.65 10*6/uL (ref 4.40–5.90)
RDW: 12.8 % (ref 11.5–14.5)
WBC: 6.2 10*3/uL (ref 3.8–10.6)

## 2014-02-15 LAB — URINALYSIS, COMPLETE
BLOOD: NEGATIVE
Bacteria: NONE SEEN
Bilirubin,UR: NEGATIVE
GLUCOSE, UR: NEGATIVE mg/dL (ref 0–75)
KETONE: NEGATIVE
LEUKOCYTE ESTERASE: NEGATIVE
Nitrite: NEGATIVE
PROTEIN: NEGATIVE
Ph: 7 (ref 4.5–8.0)
RBC,UR: <1 /HPF (ref 0–5)
SQUAMOUS EPITHELIAL: NONE SEEN
Specific Gravity: 1.008 (ref 1.003–1.030)
WBC UR: NONE SEEN /HPF (ref 0–5)

## 2014-02-15 LAB — COMPREHENSIVE METABOLIC PANEL
ALBUMIN: 3.8 g/dL (ref 3.4–5.0)
ALK PHOS: 94 U/L
Anion Gap: 7 (ref 7–16)
BILIRUBIN TOTAL: 0.3 mg/dL (ref 0.2–1.0)
BUN: 14 mg/dL (ref 7–18)
CREATININE: 1.08 mg/dL (ref 0.60–1.30)
Calcium, Total: 8.2 mg/dL — ABNORMAL LOW (ref 8.5–10.1)
Chloride: 106 mmol/L (ref 98–107)
Co2: 27 mmol/L (ref 21–32)
EGFR (African American): >60
EGFR (Non-African Amer.): >60
GLUCOSE: 105 mg/dL — AB (ref 65–99)
OSMOLALITY: 280 (ref 275–301)
Potassium: 3.6 mmol/L (ref 3.5–5.1)
SGOT(AST): 22 U/L (ref 15–37)
SGPT (ALT): 17 U/L (ref 12–78)
SODIUM: 140 mmol/L (ref 136–145)
TOTAL PROTEIN: 6.7 g/dL (ref 6.4–8.2)

## 2014-02-15 LAB — TROPONIN I

## 2014-04-16 ENCOUNTER — Ambulatory Visit: Payer: Medicare Other | Admitting: Cardiovascular Disease

## 2014-05-14 ENCOUNTER — Encounter: Payer: Self-pay | Admitting: Cardiovascular Disease

## 2014-05-14 ENCOUNTER — Ambulatory Visit (INDEPENDENT_AMBULATORY_CARE_PROVIDER_SITE_OTHER): Payer: Medicare Other | Admitting: Cardiovascular Disease

## 2014-05-14 VITALS — BP 171/101 | HR 81 | Ht 66.0 in | Wt 129.2 lb

## 2014-05-14 DIAGNOSIS — I4891 Unspecified atrial fibrillation: Secondary | ICD-10-CM

## 2014-05-14 DIAGNOSIS — I10 Essential (primary) hypertension: Secondary | ICD-10-CM

## 2014-05-14 MED ORDER — LISINOPRIL 20 MG PO TABS: 20.0000 mg | ORAL_TABLET | Freq: Every day | ORAL | Status: DC

## 2014-05-14 NOTE — Assessment & Plan Note (Signed)
Blood pressure is elevated. I increased the dose of lisinopril to 20 mg once daily.

## 2014-05-14 NOTE — Patient Instructions (Signed)
Your physician has recommended you make the following change in your medication:  Increase Lisinopril 20 mg once daily   Your physician wants you to follow-up in: 6 months with Dr. Fletcher Anon. You will receive a reminder letter in the mail two months in advance. If you don't receive a letter, please call our office to schedule the follow-up appointment.

## 2014-05-14 NOTE — Assessment & Plan Note (Signed)
His ventricular rate is controlled without medications. He did have significant bradycardia in the past on metoprolol. He has been deemed to be not a good candidate for anticoagulation due to recurrent falls with physical injuries.  He continues to have problems with falls and still takes antiseizure medication.Ian Townsend

## 2014-05-14 NOTE — Progress Notes (Signed)
Primary care physician: Dr. Kary Kos   HPI  Ian Townsend is a  73 year-old gentleman with history of stroke in 2011with left side neglect, significant gait instability, admission to the hospital in April 2013 for unresponsiveness while in bed overnight during which time he was making agonal breath noises, continued period of unresponsiveness for at least 30 minutes, found to be in atrial fibrillation with periods of profound bradycardia with heart rates in the 30s in the emergency room on telemetry and overnight after admission, metoprolol held with reported improvement of his heart rate, normal echocardiogram with systolic function greater than 55%.  he was discharged home with diagnosis of possible seizure versus syncope. He has been on antiseizure medication.  Echocardiogram 02/08/2012 shows normal ejection fraction 65%, left atrium moderately dilated, right atrium moderately dilated, moderate MR and moderate TR. The patient has recurrent falls and is not on anticoagulation for atrial fibrillation for that reason.  He had inguinal hernia surgery done without complications. He continues to use a wheelchair but is able to walk with a walker. Continues to have an unsteady gait and episodes of falls. He denies chest pain or shortness of breath. No palpitations or tachycardia.   Allergies  Allergen Reactions  . Penicillins     unknown     Current Outpatient Prescriptions on File Prior to Visit  Medication Sig Dispense Refill  . acetaminophen (TYLENOL) 325 MG tablet Take 650 mg by mouth every 4 (four) hours as needed for pain or fever.       Marland Kitchen amLODipine (NORVASC) 5 MG tablet Take 5 mg by mouth daily.      Marland Kitchen aspirin 325 MG tablet Take 325 mg by mouth daily.      Marland Kitchen atorvastatin (LIPITOR) 20 MG tablet Take 20 mg by mouth daily.      . finasteride (PROSCAR) 5 MG tablet Take 5 mg by mouth daily.      Marland Kitchen gabapentin (NEURONTIN) 100 MG capsule Take 1 capsule by mouth at bedtime.       . Melatonin 5 MG  TABS Take by mouth daily.      . miconazole (LOTRIMIN AF) 2 % powder Apply topically as needed for itching.      . phenytoin (DILANTIN) 100 MG ER capsule Take 100 mg by mouth 2 (two) times daily.       Marland Kitchen PHENYTOIN INFATABS 50 MG tablet 50 mg 2 (two) times daily.       . polyethylene glycol (MIRALAX / GLYCOLAX) packet Take 17 g by mouth daily.      Marland Kitchen senna (SENOKOT) 8.6 MG TABS Take 1 tablet by mouth 2 (two) times daily as needed.      . solifenacin (VESICARE) 10 MG tablet Take 10 mg by mouth daily.      . tamsulosin (FLOMAX) 0.4 MG CAPS Take 0.4 mg by mouth daily.      . traMADol (ULTRAM) 50 MG tablet 1 by mouth three times daily as needed for MILD pain  90 tablet  1   No current facility-administered medications on file prior to visit.     Past Medical History  Diagnosis Date  . Hyperlipidemia   . Hypertension   . Esophageal reflux   . Chronic airway obstruction, not elsewhere classified   . Seasonal allergies   . Stroke     x 2  . COPD (chronic obstructive pulmonary disease)   . History of chronic obstructive pulmonary disease   . Benign prostatic hypertrophy   .  TIA (transient ischemic attack)   . Unresponsiveness 02/08/2012    patient was sent to [facility]  . Syncope   . Arrhythmia   . Paroxysmal a-fib   . Chronic kidney disease     stage III  . PVD (peripheral vascular disease)   . Broken arm     left  . Seizures      Past Surgical History  Procedure Laterality Date  . Colonoscopy  2008    polyps  . Upper gastrointestinal endoscopy  2008  . Hernia repair Bilateral 07-28-2013    inguinal     History reviewed. No pertinent family history.   History   Social History  . Marital Status: Single    Spouse Name: N/A    Number of Children: N/A  . Years of Education: N/A   Occupational History  . Not on file.   Social History Main Topics  . Smoking status: Former Smoker -- 0.00 packs/day for 20 years    Types: Cigars    Quit date: 08/30/2010  . Smokeless  tobacco: Not on file  . Alcohol Use: No  . Drug Use: No  . Sexual Activity: Not on file   Other Topics Concern  . Not on file   Social History Narrative  . No narrative on file     PHYSICAL EXAM   BP 171/101  Pulse 81  Ht 5\' 6"  (1.676 m)  Wt 129 lb 4 oz (58.627 kg)  BMI 20.87 kg/m2  Constitutional: He is oriented to person, place, and time. He appears well-developed and well-nourished. No distress.  HENT: No nasal discharge.  Head: Normocephalic and atraumatic.  Eyes: Pupils are equal and round. Right eye exhibits no discharge. Left eye exhibits no discharge.  Neck: Normal range of motion. Neck supple. No JVD present. No thyromegaly present.  Cardiovascular: Normal rate, irregular rhythm, normal heart sounds and. Exam reveals no gallop and no friction rub. No murmur heard.  Pulmonary/Chest: Effort normal and breath sounds normal. No stridor. No respiratory distress. He has no wheezes. He has no rales. He exhibits no tenderness.  Abdominal: Soft. Bowel sounds are normal. He exhibits no distension. There is no tenderness. There is no rebound and no guarding.  Musculoskeletal: Normal range of motion. He exhibits no edema and no tenderness.  Neurological: He is alert and oriented to person, place, and time. Coordination normal.  Skin: Skin is warm and dry. No rash noted. He is not diaphoretic. No erythema. No pallor.  Psychiatric: He has a normal mood and affect. His behavior is normal. Judgment and thought content normal.      EKG:  Atrial fibrillation with ventricular rate of 76 beats per minute with PVCs  ABNORMAL    ASSESSMENT AND PLAN

## 2015-01-30 ENCOUNTER — Ambulatory Visit
Admit: 2015-01-30 | Disposition: A | Discharge status: Home / Self Care | Payer: Self-pay | Attending: Family Medicine | Admitting: Family Medicine

## 2015-02-05 NOTE — Consult Note (Signed)
PATIENT NAME:  Ian Townsend, Ian Townsend MR#:  [id number] DATE OF BIRTH:  Nov 24, 1940  DATE OF CONSULTATION:  08/06/2012  REFERRING PHYSICIAN:   CONSULTING PHYSICIAN:  Claud Kelp, MD  HISTORY OF PRESENT ILLNESS: Mr. Wissing is a 74 year old male who sustained a fall yesterday evening and noted left shoulder pain. Radiographs in the ER demonstrated a proximal humerus fracture and the patient was admitted for pain control and social reasons.   PAST MEDICAL HISTORY: 1. Seizure disorder.  2. Cardiac arrhythmia.  3. Hypertension.  4. Multiple cerebrovascular events to include 7 to 9 transient ischemic attacks and one stroke according to his son.  5. Longstanding use of tobacco products.   HOME MEDICATIONS:  1. Dilantin.  2. Lisinopril.  3. Amlodipine.  4. Aspirin.   ALLERGIES: He has allergy to penicillin.  PHYSICAL EXAMINATION:   GENERAL: He is a pleasant, alert 74 year old male.   VITAL SIGNS: He is afebrile with stable vital signs.   EXTREMITIES: He does have pain localized to his proximal left shoulder region. He has no tenderness to palpation of his right upper extremity or bilateral lower extremities. He is neurovascularly intact in all extremities to light touch sensation as well as motor function. He does have pain with motor testing in his left distal upper extremity. He has  no tenderness about his wrist, forearm, or elbow of the left upper extremity. He has 2+ palpable radial and ulnar pulses in his left upper extremity.   RADIOGRAPHS: X-rays demonstrate what appear to be a two part surgical neck fracture with mild varus angulation and impaction between the two fragments. Scapula Y view appears to show a concentric joint without dislocation.   ASSESSMENT: This is a 74 year old with a two part impacted proximal humerus fracture.   PLAN: Given the fracture pattern and minimal displacement, I recommend nonoperative management for this patient with sling and swathe. After one week of  immobilization, can begin to do some supervised passive range of motion exercises of the shoulder. He will need interval follow-up with routine radiographs to assess appropriate healing. Given his multiple medical comorbidities and relatively stable fracture pattern, I think surgical fixation is not warranted at this time.   ____________________________ Claud Kelp, MD tte:drc D: 08/06/2012 09:32:24 ET T: 08/06/2012 10:38:55 ET JOB#: [id number]  cc: Claud Kelp, MD, <Dictator> Claud Kelp MD ELECTRONICALLY SIGNED 08/06/2012 14:07

## 2015-02-05 NOTE — Discharge Summary (Signed)
PATIENT NAME:  Ian Townsend, Ian Townsend MR#:  [id number] DATE OF BIRTH:  01/25/1941  DATE OF ADMISSION:  08/05/2012 DATE OF DISCHARGE:  08/08/2012  ADDENDUM  MEDICATIONS: Senna tablet 1 p.o. b.i.d. p.r.n. for constipation.  ____________________________ Hart Rochester Posey Pronto, MD sap:cms D: 08/08/2012 14:20:49 ET T: 08/08/2012 14:27:16 ET JOB#: [id number]  cc: Taniaya Rudder A. Posey Pronto, MD, <Dictator> Ilda Basset MD ELECTRONICALLY SIGNED 08/10/2012 11:25

## 2015-02-05 NOTE — H&P (Signed)
PATIENT NAME:  Ian Townsend, Ian Townsend MR#:  [id number] DATE OF BIRTH:  20-Jul-1941  DATE OF ADMISSION:  08/05/2012  PRIMARY MD: Dr. Jerl Mina   ED REFERRING DOCTOR: Dr. Daryel November    CHIEF COMPLAINT: Status post fall with left humerus fracture.   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 The patient was seen by Neurology and was discharged on Dilantin. Earlier this morning a thump was heard by the family. When they arrived he was complaining of pain in his left shoulder. The patient has difficulty with imbalance and has fallen before. He did not have any evidence of seizure. He was brought to the ED and noted to have a left humerus fracture. Plan was for him to be discharged back to the home but the family was very concerned about recurrent falls and nobody to really look after him. Therefore, I was asked to admit the patient. The patient also complains of pain in the right hip as well. Otherwise, he is denying any fevers or chills. No chest pain. No shortness of breath.   PAST MEDICAL HISTORY:  1. 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  2. Hypertension.  3. Benign prostatic hypertrophy.  4. Chronic obstructive pulmonary disease.  5. History of tobacco abuse and alcohol abuse in the past. 6. History of recurrent urinary tract infections.  7. History of irregular heartbeat.  8. History of chronic renal failure.   ALLERGIES: Penicillin.   PAST SURGICAL HISTORY: He had a right ankle surgery.   CURRENT MEDICATIONS AT HOME:  1. Amlodipine 10 daily.  2. Aspirin 325 p.o. daily.  3. Jalyn 0.5/0.4 one tab p.o. daily.   4. Lisinopril 20 daily.  5. Dilantin 100 mg three caps p.o. at bedtime.   SOCIAL HISTORY: Used to smoke but quit smoking three years ago. Denies any alcohol or drug use. Lives with his daughter and son-in-law.   FAMILY HISTORY: No significant history of coronary artery disease or diabetes.   REVIEW OF SYSTEMS: CONSTITUTIONAL: Denies any fevers, fatigue, weakness. EYES: Denies any blurred vision or double vision. No pain. No redness. ENT: Denies any tinnitus. No ear pain. No hearing loss. No seasonal or year round allergies. No difficulty swallowing. RESPIRATORY: Denies any cough, wheezing, or hemoptysis. Has a history of COPD but not on any treatment. CARDIOVASCULAR: Denies any chest pain, orthopnea, or edema. Does have history of irregular heartbeat, seeing Dr. Mariah Milling for that. GI: No nausea, vomiting, or diarrhea. No abdominal pain. No hematemesis. No melena. GU: Denies any dysuria, hematuria, renal calculus, or frequency. ENDOCRINE: Denies any polyuria, nocturia, or thyroid problems. HEME/LYMPH: Denies any anemia, easy bruisability or bleeding. SKIN: No acne. No rash. No changes in mole, hair or skin. MUSCULOSKELETAL: Complains of pain in the left shoulder. Complains of pain in the right hip. NEUROLOGIC: Has history of CVA and possible seizure. Has chronic left-sided neglect. PSYCHIATRIC: Not anxious or depressed.   PHYSICAL EXAMINATION:   VITAL SIGNS: Temperature 98.2, pulse 85, respirations 24, blood pressure 140/74.   GENERAL: The patient is an elderly Caucasian male in no acute distress.   HEENT: Head atraumatic, normocephalic. Pupils equally round, reactive to light and accommodation. There is no conjunctival pallor.  No scleral icterus.   LUNGS: Clear to auscultation bilaterally without any rales, rhonchi, or wheezing.   CARDIOVASCULAR: Irregularly irregular rhythm. No gallops or murmurs.   ABDOMEN: Soft. Positive bowel sounds x4. There is no guarding. No rebound. No  hepatosplenomegaly.   EXTREMITIES: No clubbing, cyanosis, or edema.   SKIN: No rash.   LYMPHATICS: No lymph nodes palpable.   NEUROLOGICAL: The patient awake, alert, oriented x3. Motor strength 5/5 in the left upper extremity. Has a right arm sling in place.   LABORATORY EVALUATIONS SO FAR: WBC 11.1, hemoglobin 14.2, platelet count 165, glucose 139, BUN 26, creatinine 1.79, sodium 140, potassium 3.9, chloride 103, CO2 26. LFTs are normal. Troponin less than 0.02. Dilantin level 11.2.  PA and lateral pelvis x-ray shows likely bilateral inguinal hernia.    Chest x-ray, PA and lateral, shows no acute abnormality.   Left humerus x-ray shows angulated displaced fracture of the humerus on the left.   ASSESSMENT AND PLAN: The patient is a 74 year old white male who was hospitalized on 10/17 with possible seizure, started on Dilantin. The patient fell this a.m. due to balance, now has left humerus angulated fracture, was being discharged home in a sling, however, family concerned about him going home and wants him placed.  1. Status post fall with humerus fracture. Will have Ortho evaluate the patient. Control his pain. Sling for the time being. Case Manager evaluation.  2. Possible seizure disorder. Continue Dilantin.  3. History of CVA. Continue aspirin.  4. History of atrial fibrillation. Has an appointment to see Dr. Mariah Milling as an outpatient. Continue aspirin.  5. Benign prostatic hypertrophy. Continue Jalyn.  6. Hypertension. Continue Norvasc and lisinopril.  7. Miscellaneous. Will place him on Lovenox for DVT prophylaxis.       TIME SPENT: 32 minutes.    ____________________________ Lacie Scotts Allena Katz, MD shp:drc D: 08/05/2012 22:05:28 ET T: 08/06/2012 08:26:30 ET JOB#: [id number]  cc: Maureen Delatte H. Allena Katz, MD, <Dictator> Rhona Leavens. Burnett Sheng, MD Charise Carwin MD ELECTRONICALLY SIGNED 08/14/2012 15:30

## 2015-02-05 NOTE — Consult Note (Signed)
Referring Physician:  Nicholes Mango :   Primary Care Physician:  Patrica Duel, [address], [place], [place] [address], [phone]  Reason for Consult:  Admit Date: 04-Aug-2012   Chief Complaint: altered mental status   Reason for Consult: seizure   History of Present Illness:  History of Present Illness:   74 yo RHD M presents to hospital after having an episode at home that consisted of family hearing a loud dry then family finding him unresponsive to painful stimuli.  EMS was called and pt eventually woke up but was confused.  No incontinence or tongue biting.  Pt had prior episode like this and was started on dilantin but his outpatient neurologist started to wean this off and he had this episode.  No episodes while pt was on dilantin.  Pt is now back at his baseline.  ROS:   General fatigue    HEENT no complaints    Lungs no complaints    Cardiac no complaints    GI no complaints    GU no complaints    Musculoskeletal no complaints    Extremities no complaints    Skin no complaints    Neuro seizure    Endocrine no complaints    Psych no complaints   Past Medical/Surgical Hx:  Tobacco Use:   htn:   cva:   Past Medical/ Surgical Hx:   Past Medical History seizure, BPH    Past Surgical History none   Home Medications: Medication Instructions Last Modified Date/Time  Jalyn 0.5 mg-0.4 mg oral capsule 1  orally once a day  16-Oct-13 22:58  aspirin 325 mg oral tablet 1  orally once a day  16-Oct-13 22:58  lisinopril 20 mg oral tablet 1 tab(s) orally once a day 16-Oct-13 22:58  phenytoin 100 mg oral capsule, extended release 3 cap(s) orally once a day (at bedtime) 16-Oct-13 22:58   Allergies:  Penicillin: Unknown  Social/Family History:  Employment Status: unemployed   Lives With: other relative   Social History: former EtOH and tobacco but none recent, no illicits   Family History: n/c   Vital Signs: **Vital Signs.:   17-Oct-13  10:30   Vital Signs Type Q 4hr   Temperature Temperature (F) 98.7   Celsius 37   Temperature Source Oral   Pulse Pulse 70   Systolic BP Systolic BP 657   Diastolic BP (mmHg) Diastolic BP (mmHg) 80   Mean BP 108   Pulse Ox % Pulse Ox % 97   Pulse Ox Activity Level  At rest   Oxygen Delivery Room Air/ 21 %   Physical Exam:  General: elderly appearing, no acute distress, resting comfortable   HEENT: normocephalic, sclera nonicteric, oropharynx clear   Neck: supple, no JVD, no bruits   Chest: CTA B, no wheezing, good movement   Cardiac: RRR, no murmurs, no edema, 2+ pulses   Extremities: no C/C, FROM   Neurologic Exam:  Mental Status: alert and oriented x 3, normal language, follows complex commands, mild dysarthria   Cranial Nerves: PERRLA, EOMI, L homonymous hemianopsia, L droop, tongue midline, shoulder shrug equal   Motor Exam: 4/5 L and 5/5 R, nl tone, no tremor   Deep Tendon Reflexes: 1+/4 bilateral, Downgoing Plantars bilateral   Sensory Exam: neglect of L side   Coordination: FTN and HTS WNL, nl RAM   Lab Results: Hepatic:  16-Oct-13 22:41    Bilirubin, Total 0.3   Alkaline Phosphatase 89   SGPT (ALT)  19   SGOT (AST) 22   Total Protein, Serum 6.9   Albumin, Serum 4.0  Routine Chem:  16-Oct-13 22:41    Glucose, Serum  169   BUN  19   Creatinine (comp)  1.76   Sodium, Serum 138   Potassium, Serum 3.9   Chloride, Serum 103   CO2, Serum  19   Calcium (Total), Serum  8.3   Osmolality (calc) 282   eGFR (African American)  44   eGFR (Non-African American)  38 (eGFR values <65m/min/1.73 m2 may be an indication of chronic kidney disease (CKD). Calculated eGFR is useful in patients with stable renal function. The eGFR calculation will not be reliable in acutely ill patients when serum creatinine is changing rapidly. It is not useful in  patients on dialysis. The eGFR calculation may not be applicable to patients at the low and high extremes of body sizes,  pregnant women, and vegetarians.)   Anion Gap 16  Cardiac:  16-Oct-13 22:41    Troponin I < 0.02 (0.00-0.05 0.05 ng/mL or less: NEGATIVE  Repeat testing in 3-6 hrs  if clinically indicated. >0.05 ng/mL: POTENTIAL  MYOCARDIAL INJURY. Repeat  testing in 3-6 hrs if  clinically indicated. NOTE: An increase or decrease  of 30% or more on serial  testing suggests a  clinically important change)  Routine UA:  16-Oct-13 23:30    Color (UA) Straw   Clarity (UA) Clear   Glucose (UA) 50 mg/dL   Bilirubin (UA) Negative   Ketones (UA) Negative   Specific Gravity (UA) 1.008   Blood (UA) 1+   pH (UA) 6.0   Protein (UA) 30 mg/dL   Nitrite (UA) Negative   Leukocyte Esterase (UA) Negative (Result(s) reported on 03 Aug 2012 at 11:41PM.)   RBC (UA) NONE SEEN   WBC (UA) <1 /HPF   Bacteria (UA) NONE SEEN   Epithelial Cells (UA) NONE SEEN   Hyaline Cast (UA) 1 /LPF (Result(s) reported on 03 Aug 2012 at 11:41PM.)  Routine Hem:  16-Oct-13 22:41    WBC (CBC) 7.4   RBC (CBC) 4.51   Hemoglobin (CBC) 14.8   Hematocrit (CBC) 42.4   Platelet Count (CBC)  148 (Result(s) reported on 03 Aug 2012 at 11:00PM.)   MCV 94   MCH 32.7   MCHC 34.9   RDW 13.3   Radiology Results: CT:    16-Oct-13 23:51, CT Head Without Contrast   CT Head Without Contrast    REASON FOR EXAM:    altered mental status  COMMENTS:       PROCEDURE: CT  - CT HEAD WITHOUT CONTRAST  - Aug 03 2012 11:51PM     RESULT: Emergent noncontrast CT of the brain is compared to a previous   exam from 08 February 2012.    Advanced atrophy is present in the cerebral and cerebellar hemispheres.   There is low attenuation diffusely within the periventricular and   subcortical white matter most consistent with microangiopathy.   Periventricular small infarcts are seen especially adjacent to the right   lateral ventricle with some basal ganglia lacunar infarcts appearing   stable bilaterally and some punctate left basal ganglia  calcification.   Old appearing bilateral cerebellar infarcts are noted. Atherosclerotic     calcification is present. There is no evidence of intracranial   hemorrhage, mass or mass effect. There is a tiny mucous retention cyst in   the floor of the left maxillary sinus on image #2. The sinuses otherwise  appear clear. The calvarium is intact. There is some partial   opacification of the posterior inferior right mastoid air cells suggested   on image #4.    IMPRESSION:   1. Cerebral and cerebellar atrophy with old ischemic changes and   microangiopathy. Basal ganglia lacunar infarcts, corona radiata   periventricular infarct on the right and bilateral small cerebellar   infarcts are again noted. No acute change or abnormality evident.    Thank you for the opportunity to contribute to the care of your patient.     Dictation Site: 1          Verified By: Sundra Aland, M.D., MD   Radiology Impression:  Radiology Impression: CT of head personally reviewed by me shows old encephalomalacia of the R cerebral hemisphere   Impression/Recommendations:  Recommendations:   previous records reviewed by me d/w referring physician reviewed by me and completely normal    Seizure-  pt had similar episode in April and this hx is consistent with this.  Plus pt has old stroke which is the most likely focus.  No further episodes after being loaded on dilantin in ER. R hemispheric old stroke-  stable, no new deficits Hypertension-  somewhat elevated  restart dilantin 386m qHS no need for EEG or MRI no driving or operating heavy machinery for 6 months pt to f/u with his Neurologist in 6-8 will sign off  Electronic Signatures: SJamison Neighbor(MD)  (Signed 17-Oct-13 13:48)  Authored: REFERRING PHYSICIAN, Primary Care Physician, Consult, History of Present Illness, Review of Systems, PAST MEDICAL/SURGICAL HISTORY, HOME MEDICATIONS, ALLERGIES, Social/Family History, NURSING VITAL SIGNS,  Physical Exam-, LAB RESULTS, RADIOLOGY RESULTS, Recommendations   Last Updated: 17-Oct-13 13:48 by SJamison Neighbor(MD)

## 2015-02-05 NOTE — H&P (Signed)
PATIENT NAME:  MURTAZA, SHELL MR#:  [id number] DATE OF BIRTH:  03/08/1941  DATE OF ADMISSION:  08/04/2012  ADDENDUM  TOTAL TIME SPENT ON HISTORY AND PHYSICAL AND COORDINATION OF CARE: 60 minutes.   ____________________________ Nicholes Mango, MD ag:cms D: 08/04/2012 02:45:12 ET T: 08/04/2012 08:43:07 ET JOB#: [id number]  cc: Nicholes Mango, MD, <Dictator> Nicholes Mango MD ELECTRONICALLY SIGNED 08/12/2012 1:33

## 2015-02-05 NOTE — Discharge Summary (Signed)
PATIENT NAME:  Ian Townsend, Ian Townsend MR#:  [id number] DATE OF BIRTH:  1941-07-01  DATE OF ADMISSION:  08/05/2012 DATE OF DISCHARGE:  08/08/2012  PRIMARY CARE PHYSICIAN: Jerl Mina, MD  DISPOSITION: The patient is being discharged to rehab.   PRESENTING COMPLAINT: Fall and left shoulder pain.   DISCHARGE DIAGNOSES:  1. Left proximal humerus minimal displaced fracture. Conservative management per Dr. Rodena Medin, Orthopedics.  2. Suspected seizures. The patient is on Dilantin.  3. History of cerebrovascular accident.  4. Benign prostatic hypertrophy.  CODE STATUS: FULL CODE.   CONDITION ON DISCHARGE: Fair. Vitals stable.   CONSULTANTS: Thornton Papas, MD - Orthopedic.  DISCHARGE FOLLOWUP: The patient will followup with orthopedics, most likely it is going to be Dr. Ernest Pine. We are trying to get an appointment for followup. Followup with Dr. Burnett Sheng in 2 to 3 weeks.   MEDICATIONS:  1. Tylenol 650 mg p.o. every four hours p.r.n.  2. Norco 5/325 mg 1 to 2 every four hours p.r.n.  3. Amlodipine 10 mg daily.  4. Aspirin 325 mg daily.  5. Lisinopril 20 mg daily.  6. Dilantin 100 mg at bedtime.  7. Avodart 0.5 mg daily.  8. Flomax 0.4 mg p.o. daily.  DISCHARGE REFERRALS: Physical therapy.   DISCHARGE DIET: 2 gram sodium diet.  DISCHARGE LABORATORY, DIAGNOSTIC AND RADIOLOGIC DATA: Urinalysis is negative for urinary tract infection.   CBC is within normal limits. Comprehensive metabolic panel is within normal limits, except glucose of 139, BUN 26, and creatinine 1.79. Troponin is 0.02. Dilantin is 11.2.   EKG shows atrial fibrillation with PVCs.   Left humerus shows angulated displaced fracture of the humeral neck, on the left.   CT of the head: Cerebral and cerebellar atrophy with old ischemic changes and microangiopathy, basal lacunar infarct on the right and bilateral setting cerebellar infarct on the left.  BRIEF SUMMARY OF HOSPITAL COURSE: Mr. Teas is a 74 year old Caucasian gentleman with  history of CVA with complete occlusion of right carotid artery and history of COPD along with irregular heart rhythm which is suspected atrial fibrillation, and benign prostatic hypertrophy, who comes to the Emergency Room with:  1. Status post fall with left proximal humerus fracture. The patient was seen by Dr. Rodena Medin and given his minimal displaced fracture along with other comorbidities it was decided to do conservative management. He is provided a sling for the time being and will follow up with orthopedics as an outpatient. We are trying to get an appointment with Dr. Ernest Pine and the case manager is working toward it. Prior to discharge, we will have an appointment for him to be followed up in at 2 weeks to follow up with x-rays to ensure appropriate healing. The patient was seen by physical therapy who recommended rehab. He is going to go to [facility] for rehab.  2. Possible seizure disorder. Continue Dilantin.  3. History of cerebrovascular accident. On aspirin.  4. History of atrial fibrillation. The patient will follow up with Dr. Mariah Milling as an outpatient. His rate was controlled. He was continued on aspirin. No further anticoagulation was given given his history of falls.  5. Benign prostatic hypertrophy. Continue Haynes Bast which is a combination of Flomax and dutasteride.  6. Hypertension. On Norvasc and lisinopril.  7. Deep vein thrombosis prophylaxis with Lovenox.   His hospital stay otherwise remained stable. The patient remained a FULL CODE.  TIME SPENT: 50 minutes. ____________________________ Wylie Hail Allena Katz, MD sap:slb D: 08/08/2012 14:05:41 ET T: 08/08/2012 14:17:31 ET JOB#: [id number]  cc: Lilyanah Celestin A. Allena Katz, MD, <Dictator> Rhona Leavens. Burnett Sheng, MD Danelle Earthly, MD Willow Ora MD ELECTRONICALLY SIGNED 08/10/2012 11:25

## 2015-02-05 NOTE — Discharge Summary (Signed)
PATIENT NAME:  Ian Townsend, Ian Townsend MR#:  [id number] DATE OF BIRTH:  12/07/1940  DATE OF ADMISSION:  08/03/2012 DATE OF DISCHARGE:  08/04/2012  DISCHARGE DIAGNOSIS: Altered mental status likely due to breakthrough seizure, now back to baseline, started on anti seizure medication.   SECONDARY DIAGNOSES:  1. History of cerebrovascular accident.  2. Benign prostatic hypertrophy.  3. Hypertension. 4. Chronic obstructive pulmonary disease.  5. History of urinary tract infection.   CONSULTANTS: Valora Corporal, MD - Neurology.  PROCEDURES/RADIOLOGY: CT scan of the head without contrast on 08/03/2012  showed old ischemic changes and microangiopathy. Basal ganglia lacunar infarct. No acute changes or abnormality.  MAJOR LABORATORY PANEL: Urinalysis on admission was negative.   HISTORY AND SHORT HOSPITAL COURSE: The patient is a 74 year old male with the above-mentioned medical problems who was admitted for altered mental status thought to be secondary to breakthrough seizure. Please see Dr. Illene Silver Gouru's dictated history and physical for further details. The patient was recently being titrated off Dilantin as an outpatient from neurology and at home seemed to be having mental status changes which was thought to be due to seizure. Neurology consultation was obtained with Dr. Valora Corporal who recommended starting back antiseizure medication as his seizure was thought to be secondary to old stroke which is most likely focus. The patient did not have any further episode in the hospital and his mental status was back to baseline. Neurology did not recommend any further work-up including EEG and/or MRI and recommended starting back Dilantin 300 mg once at bedtime. The patient was discharged home on 08/04/2012 in stable condition.     DISCHARGE PHYSICAL EXAMINATION:   VITAL SIGNS: On the date of discharge, his temperature was 98.7, heart rate 70 per minute, respirations 18 per minute, blood pressure 132/70 mmHg,  and he was saturating 97% on room air.   CARDIOVASCULAR: S1 and S2 normal. No murmurs, rubs, or gallops.   PULMONARY: Lungs clear to auscultation bilaterally. No wheezing, rales, rhonchi, or crepitation.   ABDOMEN: Soft, benign.   NEUROLOGIC: Nonfocal examination. All other physical examination remained at baseline.   DISCHARGE MEDICATIONS:  1. Aspirin 325 mg p.o. daily.  2. Lisinopril 20 mg p.o. daily.  3. Phenytoin 300 mg p.o. at bedtime.  4. Amlodipine 10 mg p.o. daily. 5. Jalyn 0.5/0.4 mg one capsule p.o. daily.   DISCHARGE DIET: Regular.   DISCHARGE ACTIVITY: As tolerated.   DISCHARGE INSTRUCTIONS AND FOLLOW-UP: The patient was instructed to follow-up with Dr. Maryland Pink in 1 to 2 weeks. He will need follow-up with [facility]rology            in 2 to 4 weeks. He was instructed not to drive or operate heavy machinery for the next six months.   TOTAL TIME DISCHARGING THIS PATIENT: 55 minutes. ____________________________ Lucina Mellow. Manuella Ghazi, MD vss:slb D: 08/07/2012 17:57:00 ET T: 08/08/2012 10:57:53 ET JOB#: [id number]  cc: Kandra Graven S. Manuella Ghazi, MD, <Dictator> Irven Easterly. Kary Kos, MD Mila Homer Tamala Julian, MD [facility] Neurology Lucina Mellow [facility] MD ELECTRONICALLY SIGNED 08/09/2012 16:16

## 2015-02-05 NOTE — H&P (Signed)
PATIENT NAME:  Ian Townsend, Ian Townsend MR#:  [id number] DATE OF BIRTH:  05-Dec-1940  DATE OF ADMISSION:  08/04/2012  PRIMARY CARE PHYSICIAN:  Dr. Maryland Pink.   CHIEF COMPLAINT: Altered mental status/questionable seizure.   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 The patient lives with his daughter and son-in-law. The patient's son, who is bedside, is reporting that at around 10 p.m. today the patient's daughter heard a big holler from his room. By the time she went there, he was unresponsive. She was not able to arouse him, but the patient was snoring and raspy breathing. While the patient's daughter was trying to wake him up, son-in-law had called EMS. Eventually in a few minutes, the patient opened his eyes, but still was not able to communicate and seemed to be confused by the time EMS arrived. No one noticed any urinary incontinence or fecal incontinence. No one witnessed any shakes or shivering.   The patient had a similar complaint in the past during the previous admission in 02/08/2012. The patient's son who is at bedside is reporting that the patient's Dilantin was discontinued by his cardiologist recently as all his seizure tests were negative which were done recently. During my examination the patient is awake and alert and answering most of the questions appropriately. But the patient thinks he had an episode of seizure. The patient had acute episode of stroke in December 2012 and since then he has left-sided neglect. The patient is still ambulatory and quite independent and takes care of himself. The patient's temperature 98.1 degrees F. with pulse  of 77. Blood pressure initially was at 153/78. CT of head was done which was negative for any acute findings. The patient was loaded with Fosphenytoin one time in the ER. Hospitalist was called to admit the patient to rule out breakthrough seizures versus acute stroke. The patient denies any difficulty with swallowing or speech changes. They did not notice any new motor or sensory deficits. The patient denies any chest pain or shortness of breath during my examination.   PAST MEDICAL HISTORY:   1. 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 2. Hypertension. 3. Benign prostatic hypertrophy. 4. Chronic obstructive pulmonary disease.  5. History of tobacco and alcohol abuse.  6. Known history of urinary tract infection.   PAST SURGICAL HISTORY:  Reviewed. No surgical history.   ALLERGIES: The patient is allergic to Penicillin.   SOCIAL HISTORY: He used to smoke but quit smoking three years ago. Denies any alcohol or illicit drug uses. The patient lives with his daughter and son-in-law.   FAMILY HISTORY:  No significant family history of coronary artery disease or diabetes.   HOME MEDICATIONS: 1. Lisinopril 20 milligrams by mouth once daily.  2. Jalyn 0.5-0.4 milligrams once daily.  3. Aspirin 325 milligrams p.o. once daily.  4. Amlodipine 5 milligrams p.o. once daily.   REVIEW OF SYSTEMS:  CONSTITUTIONAL: The patient denies any fever, fatigue or weakness. EYES: Denies any blurry vision or inflammation. EARS, NOSE AND THROAT: Denies any ear pain or hearing loss. The patient denies any postnasal drip. RESPIRATORY: No coughing, wheezing or hemoptysis. CARDIOVASCULAR: Denies any chest pain or shortness of  breath. Denies any edema or palpitations. GASTROINTESTINAL: No nausea, vomiting or diarrhea. Denies any abdominal pain. GENITOURINARY: Denies dysuria or hematuria. ENDOCRINE: The patient denies polyuria, nocturia or thyroid  problems. MUSCULOSKELETAL: The patient denies pain in the neck, back or shoulders. The patient denies any arthritic swelling. NEUROLOGIC: The patient denies any new episodes of weakness or dysarthria. Denies any headache or dementia. INTEGUMENTARY: No acne or rashes.   PHYSICAL EXAMINATION:    VITAL SIGNS: Temperature 98.1, pulse 77, respiratory rate 20, blood pressure 153/78.   GENERAL: Elderly male in no apparent distress.   HEENT: Atraumatic, normocephalic. Pupils are equal and reacting to light and accommodation. Conjunctiva pink. Sclera anicteric. Moist mucous membranes.   NECK: Supple. No jugular venous distention. No thyromegaly.   CHEST: Good air entry bilaterally. No wheezing or rales. No rhonchi. No crackles.   CARDIOVASCULAR: Irregularly irregular. No gallops. No rubs.   ABDOMEN: Soft. Bowel sounds are positive in four quadrants. Nontender, nondistended.  No hepatosplenomegaly.   NEUROLOGIC: Awake, alert and oriented x3. Motor strength 5/5 on right upper and lower extremities. On the left side muscle strength is 5/5 both upper and lower extremities. Positive left-sided neglect.   EXTREMITIES: No edema, no cyanosis, no clubbing.   SKIN: No rashes, no cyanosis, no clubbing.   LABORATORY, RADIOLOGICAL AND DIAGNOSTIC DATA: CT scan of head revealed no evidence of acute intracranial hemorrhage. Atrophy and chronic ischemic changes. The patient's glucose is 169, BUN 19, creatinine 1.76 (It looks like the patient's baseline creatinine is between 1.5 to 1.70). Sodium 138, potassium 3.9, chloride 103, C02 19. Calcium 8.3. Serum osmolality 282. Troponin less than 0.02. WBC 7,400, hemoglobin 14.8, hematocrit 42.4, platelet count 148,000.  Urinalysis: Straw color, clear in appearance. Ketones negative. Specific gravity 1.008. Blood is 1+. Leukocyte esterase negative.   ASSESSMENT AND PLAN:    1. Altered mental status/syncope probably from breakthrough seizures versus transient ischemic attack  versus arrhythmia. From the history with the recent discontinuation of Dilantin, this is most likely breakthrough seizures. The patient was given loading dose of Fosphenytoin in the ER. Will continue Dilantin 300 milligrams p.o. at bedtime. Neurology consultation. As the patient had a recent EEG during the previous admission in April, I am not reordering EEG. I am leaving the repeat order of EEG to neurologist's discretion. Will monitor him on telemetry. The patient's heart rate is well controlled at this time and Metoprolol was discontinued during the previous admission in view of bradycardia, so I will hold off on Metoprolol at this time.  2. Paroxysmal atrial fibrillation. Rate controlled. Continue aspirin. 3. Old history of stroke with left-sided neglect. Continue aspirin. Adjust his home medications. 4. Chronic renal failure. The patient is close to his baseline. I will hold off on lisinopril. Will increase his Amlodipine to 10 milligrams as I am holding Lisinopril.  5. Hypertension. Will continue Amlodipine and also increase to 10 milligrams as we are holding Lisinopril in view of slight worsening of renal function.  6. Will put him on gastrointestinal prophylaxis and deep venous thrombosis prophylaxis with heparin subcutaneous.  CODE STATUS: FULL CODE.  The diagnosis and plan of care was discussed in detail with the patient and the patient's son who is bedside. They were in agreement and understanding of the plan.   TOTAL TIME SPENT ON HISTORY AND PHYSICAL AND COORDINATION OF CARE: 60 minutes.   ____________________________ Nicholes Mango, MD ag:ap D: 08/04/2012 02:37:57 ET T: 08/04/2012 08:53:46 ET JOB#: [id number]  cc: Nicholes Mango, MD, <Dictator> Irven Easterly.  Kary Kos, MD Dr. Tamala Julian, Neurology Nicholes Mango MD ELECTRONICALLY SIGNED 08/12/2012 1:33

## 2015-02-08 NOTE — Op Note (Signed)
PATIENT NAME:  Ian Townsend, Ian Townsend MR#:  [id number] DATE OF BIRTH:  [birth year]  DATE OF PROCEDURE:  07/28/2013  PREOPERATIVE DIAGNOSIS: Bilateral inguinal hernia, right with chronic incarceration.   POSTOPERATIVE DIAGNOSIS: Bilateral inguinal hernia, right with chronic incarceration.   OPERATIVE PROCEDURE: Bilateral inguinal hernia repair with medium Ultrapro mesh.   SURGEON: Hervey Ard, MD   ANESTHESIA: General endotracheal under Dr. Carolin Sicks, Xylocaine with 1:200,000 units epinephrine 20 mL, Exparel 20 mL, Toradol 30 mg.   ESTIMATED BLOOD LOSS: Minimal.   CLINICAL NOTE: This 74 year old male has had a long-standing incarcerated right inguinal hernia. He has had increasing discomfort. No evidence of obstruction. CT scan showed bilateral direct defects with no involvement of the bladder. He was felt to be a candidate for operative repair.   DESCRIPTION OF PROCEDURE: The patient had hair removed from the area with clippers prior to the procedure. He received Kefzol intravenously. A Foley catheter was placed by the nurse, after the induction of general anesthesia. The abdomen was prepped with ChloraPrep and the perineum prepped with Betadine. A 7 cm incision along the anticipated course of the inguinal canal in the right groin was carried down through skin and subcutaneous tissue with hemostasis achieved with electrocautery and 3-0 Vicryl ties. The markedly attenuated external oblique fascia was opened and the hernia sac separated from the cord structures. This extended from the medial aspect of the inferior epigastric vessels to the pubis. The sac was opened. No bowel contents. It was excised at the level of the peritoneal floor and closed with a running 2-0 Vicryl locking suture. The preperitoneal space was opened and a medium Ultrapro mesh was smoothed into position. The external component was laid along the floor of the inguinal canal. This was anchored in place with 0 Surgilon sutures. A lateral  slit was made for cord passage. The attenuated external oblique was closed with a running 2-0 Vicryl. Scarpa fascia was closed with running 3-0 Vicryl, and the skin closed with running 4-0 Vicryl subcuticular suture.   Prior to closure, 10 mL of Exparel was infiltrated in the deep tissue and into the skin. 15 mg of Toradol was placed into the wound prior to closure.   Attention was turned to the left groin. A similar 5 cm incision was made along the anticipated course of the inguinal canal. Skin and subcutaneous tissue was divided sharply. The external oblique was slightly less attenuated on this side, although there was still a generous hernia sac. The sac was freed from the adjacent structures. Again, it ran from the medial aspect of the inferior epigastric vessels to the pubis. The sac was inverted upon itself and the preperitoneal space smoothed to allow placement of a medium Ultrapro mesh. The mesh was anchored to the pubic tubercle, as on the right side, and interrupted 0 sutures were used between the inguinal ligament and the mesh, as well as the transverse abdominis aponeurosis and the mesh. Lateral slit was made for cord passage. Toradol, Exparel and Xylocaine with epinephrine was instilled as on the right side. The external oblique was closed with running 2-0 Vicryl, Scarpa's fascia with a running 3-0 Vicryl, and the skin closed with a running 4-0 Vicryl subcuticular suture. Benzoin, Steri-Strips, Telfa and Tegaderm dressings were applied.   The patient tolerated the procedure well and was taken to the recovery room after removal of the Foley catheter.   ____________________________ Devondre Bellow, MD jwb:sb D: 07/28/2013 11:27:12 ET T: 07/28/2013 11:40:34 ET JOB#: [id number]  cc: Forest Gleason.  Bary Castilla, MD, <Dictator> Irven Easterly. Kary Kos, MD JEFFREY Amedeo Kinsman MD ELECTRONICALLY SIGNED 07/28/2013 20:01

## 2015-02-10 NOTE — Discharge Summary (Signed)
PATIENT NAME:  Ian Townsend, Ian Townsend MR#:  [id number] DATE OF BIRTH:  [birth year]  DATE OF ADMISSION:  02/08/2012 DATE OF DISCHARGE:  02/10/2012  ADMITTING DIAGNOSES:  1. Unresponsiveness. 2. Syncope. 3. Paroxysmal atrial fibrillation. 4. Questionable new cerebrovascular accident. 5. Acute on chronic renal failure. 6. Hypertension.   DISCHARGE DIAGNOSES:  1. Altered mental status, suspected seizure, in postictal period, resolved.  2. Atrial fibrillation, slow ventricular response, resolved off beta blocker. 3. History of hypertension. 4. Hyperlipidemia. 5. Paroxysmal atrial fibrillation - nonsustained. The patient will be started on Coumadin therapy.  6. History of benign prostatic hypertrophy. 7. Peripheral vascular disease. 8. Cerebrovascular accident in the right MCA distribution, stable.  9. Chronic obstructive pulmonary disease.  10. Chronic renal insufficiency with creatinine level of 1.52, on 02/09/2012.  11. Ambulatory dysfunction, balance problems.   DISCHARGE CONDITION: Stable.   DISCHARGE MEDICATIONS: The patient is to resume his outpatient medications. 1. Jalyn 0.5/0.4 mg p.o. daily.  2. Amlodipine 5 mg p.o. daily.  3. K-Dur (potassium chloride) 10 mEq p.o. daily.  4. Coumadin 7.5 mg p.o. daily. The patient was advised to have pro time as well as INR checked in two days and report to Dr. Kary Kos for recommendations.  5. Aspirin 81 mg p.o. daily down from 325 mg p.o. daily. 6. Lipitor 20 mg p.o. at bedtime down from 40 mg at bedtime.  7. Dilantin 300 mg p.o. at bedtime.  8. Cepacol lozenges one every 1 hour as needed.  9. Colace 100 mg p.o. twice daily.  10. Hydrocortisone 0.5% cream topically twice daily as needed for itching.  11. Lisinopril 20 mg p.o. daily dose. This is a new dose up from 10 mg p.o. daily dose.   NOTE: The patient was advised not to take metoprolol.   DIET: 2 grams salt, low fat, low cholesterol.   PHYSICAL ACTIVITY LIMITATIONS: As tolerated.    REFERRALS: The patient is referred to physical therapy 2 to 7 times a week.   DISCHARGE FOLLOWUP: He is to follow-up with Dr. Kary Kos in two days after discharge and have pro time and INR checked in two days after discharge.  CONSULTANTS:  1. Valora Corporal, MD. 2. Serafina Royals, MD. 3. Care Management.   RADIOLOGIC STUDIES: Chest x-ray PA and lateral on 02/08/2012 showed interstitial infiltrate likely representing pulmonary edema. Findings likely representing a hiatal hernia.  Underlying component of pulmonary fibrosis is also a diagnostic consideration.   CT of the head without contrast on 02/08/2012 showed areas of chronic lacunar infarction, severe involutional changes. No evidence of acute abnormalities.   MRI of brain without contrast on 02/09/2012 showed no acute intracranial pathology.   Echocardiogram on 02/08/2012 showed left ventricular systolic function is normal. Ejection fraction 65%. Left atrium is moderately dilated. Right atrium is moderately dilated. There is moderate mitral regurgitation. There is moderate tricuspid regurgitation.   REASON FOR ADMISSION: The patient is 74 year old Caucasian male with past medical history significant for history of multiple medical problems including a stroke in the past and chronic obstructive pulmonary disease who presented to the hospital after he was found to be unresponsive at home. Please refer to Dr. Emeline Gins Elgergawy's admission note on 02/08/2012.   On arrival to the emergency room, the patient was alert. His temperature was 98.5, pulse 93, respiration rate 18, blood pressure 143/66, and saturation was 94% on room air. Physical examination was unremarkable.   The patient's EKG showed atrial fibrillation at a rate of 98, premature ventricular or aberrantly conducted complexes, and  abnormality of ST segment, but no significant change since prior EKG. While in the emergency room, the patient was noted to have episodes of bradycardia  going down as low as 30s while in atrial fibrillation.  HOSPITAL COURSE: He was admitted to the hospital with diagnosis of altered mental status, which was initially thought to be due to bradycardia. In the hospital, he underwent further evaluation for possible stroke or possible seizure disorder. Because of his presentation concerning for unresponsiveness as well as initial confusion after the episode of unresponsiveness lasting approximately 30 minutes, he was thought to have possible seizure disorder as well. Electroencephalogram was performed on 02/08/2012 which was abnormal. It showed continuous suppression of the background especially on O1. Both rhythms attenuated with eye opening. There was one isolated epileptiform discharge due to a spike seen in lead O2. Photic stimulation did not induce seizure activity and did not induce photic driving response.  Otherwise normal elements of sleep were recorded. However, it was felt that the patient's electroencephalogram was abnormal due to right hemisphere suppression as well as one isolated epileptiform discharge. The patient underwent MRI of his brain and had consultation with Dr. Valora Corporal. Dr. Tamala Julian felt that the patient had an episode of altered mental status which was probably seizure given his history as well as quick resolution. As the patient's electroencephalogram did show one epileptiform discharge on the hemisphere, which was opposites to the right MCV infarct, he recommended to start the patient on Dilantin. For this reason, the patient was started on Dilantin and his level was checked initially after the first loading dose. On 02/10/2012, the patient's Dilantin level is 11.8. The patient is going to be given 300 more mg of Dilantin a part from his usual bedtime dose of 300 mg and his Dilantin level should be checked as an outpatient to ensure its stable therapeutic levels.   The patient was evaluated by a physical therapist who recommended that  he would benefit from rehab. The patient had problems with balance upon discharge.   In regards to atrial fibrillation and slow ventricular response, it was felt to be due to beta blockers. The patient's beta blocker, metoprolol, was stopped and the patient's heart rate bounced back to 60s to 70s and remained stable afterwards. The patient will not be have beta blockers resumed despite Dr. Nehemiah Massed recommending some beta blockers, possibly very low dose of beta blocker for heart rate control, which could change with ambulation. However, the patient's family was somewhat reluctant to start this medication at this point. It is recommended to start it as an outpatient if the patient's family is agreeable for that.  Because the patient's atrial fibrillation change into sinus rhythm and sustained, and because of history of stroke in the past, which was felt to be embolic, Dr. Nehemiah Massed as well as Dr. Tamala Julian recommended Coumadin therapy, which will be initiated for the patient upon discharge. The patient will be started on 7.5 mg of Coumadin. His pro time should be checked in two days and reported to Dr. Kary Kos for adjustment. This was discussed also with the patient's family and the patient's family was also advised to watch the patient's physical abilities and make decisions to discontinue possibly Coumadin therapy if the patient is unstable, ambulatory, because of  risks of falls and bleeding.   In regards to hypertension, as the patient's beta blocker was stopped, the patient's ACE inhibitor, lisinopril, was advanced to 20 mg p.o. daily dose. It is recommended to follow the patient's  blood pressure readings and make decisions about initiation of medications, possibly very low dose of beta blocker such as Coreg 3.125 mg daily dose or advancement of the patient's lisinopril to even higher doses.   For hyperlipidemia, the patient's lipid panel was checked while he was in the hospital and LDL was found to be 34.  The patient's total cholesterol was 104, triglycerides 66 and HDL 57. It was felt that the patient's cholesterol medication can be decreased in dose. It is recommended to follow the patient's lipid panel and make sure the patient's LDL remains below 100, preferably below 70, but not lower than 50 because of concern of intracranial bleed with low LDL, as per studies.   In regards to BPH, the Sharyne Richters was stopped initially because of bradycardia, however, upon discharge from the hospital this will be restarted. The patient was experiencing significant frequent urination when Jalyn was stopped. Of note, the patient's urinalysis was checked and that was within normal limits negating urinary tract infection.   For peripheral vascular disease, history of stroke, and chronic obstructive pulmonary disease the patient is to continue his outpatient medications. The patient was noted to have chronic renal insufficiency, which was reported even in 2012. The patient's creatinine remained stable. On the day of the admission, the patient's BUN and creatinine were 23 and 1.67, respectively. On the day of discharge, the patient's BUN and creatinine were 22 and 1.52. It is recommended to follow the patient's BUN and creatinine as an outpatient and make decisions about nephrology consultation if needed. Of note, the patient did not have his bladder volume checked post-void  unfortunately, which might be a good idea to do especially in view of his significant benign prostatic hypertrophy.   The patient was noted to be hyperglycemic on the day of admission, however, fasting blood glucose level was checked on 02/09/2012 and was within normal limits at 98. The patient had a hemoglobin A1c checked in the past, which was 5.4. This was also communicated to the patient's family.  The patient is being discharged in stable condition with the above-mentioned medications and follow-up. His vital signs on the day of discharge show a  temperature of 99.8, pulse 65, respiration rate 18, blood pressure 146/59, and saturation 98% on room air at rest.   TIME SPENT: 40 minutes. ____________________________ Theodoro Grist, MD rv:slb D: 02/10/2012 14:32:24 ET T: 02/10/2012 15:04:49 ET JOB#: [id number]  cc: Theodoro Grist, MD, <Dictator> Irven Easterly. Kary Kos, MD Theodoro Grist MD ELECTRONICALLY SIGNED 02/11/2012 20:19

## 2015-02-10 NOTE — H&P (Signed)
PATIENT NAME:  Ian Townsend, Ian Townsend MR#:  [id number] DATE OF BIRTH:  05/03/1941  DATE OF ADMISSION:  02/08/2012  REFERRING PHYSICIAN: Dana Allan, MD  PRIMARY CARE PHYSICIAN: Jerl Mina, MD  CHIEF COMPLAINT: Altered mental status.   HISTORY OF PRESENT ILLNESS: Mr. 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 The patient lives with his daughter and son-in-law. His daughter reports at night she heard some loud snoring from his him, which is not his baseline. She went to his room and tried to wake him up for a couple of times. The patient did not respond to her at all. She called EMS who tried to wake him up without any response from him and then reports the patient had improvement of his mental status where he gradually became more responsive and he quickly came back to his baseline. The patient denies recalling the events, does not recall how he came here. The patient denies any chest pain, any shortness of breath, tongue biting, any urinary incontinence, or any confusion after he regained his mental status. Upon presentation to the ED, after reviewing the patient's old EKGs, some old EKGs showed him to have some atrial fibrillation while the rest did show him to be in sinus rhythm. The patient was on telemetry in the ED where at one point his heart rate dropped all of a sudden to the mid 30s which did thereafter recover spontaneously without any medical intervention. The patient denies any feeling of any chest pain or shortness of breath at this point.   PAST MEDICAL HISTORY:  1. 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  2. Hypertension.  3. Benign prostatic hypertrophy.  4. History of previous urinary tract  infection.  5. Chronic obstructive pulmonary disease.  6. History of tobacco and alcohol abuse.  DRUG ALLERGIES: Penicillin.   SOCIAL HISTORY: He used to be a smoker, quit a couple of years ago. Occasionally smokes cigars. No alcohol abuse. No drug abuse. He lives with his daughter.   FAMILY HISTORY: No significant family history of coronary artery disease or diabetes.   CURRENT MEDICATIONS: 1. Norvasc 5 mg oral daily.  2. Aspirin 325 mg oral daily.  3. Atorvastatin 40 mg oral at bedtime.  4. Jalyn 0.5/0.4 mg oral daily.  5. KCl 10 milliequivalents one tablet daily.  6. Lisinopril 10 mg oral daily.  7. Metoprolol 25 mg 1/2 tablet a day.  REVIEW OF SYSTEMS: CONSTITUTIONAL: The patient denies any fever, fatigue. EYES: Denies any blurry vision, double vision, pain, or redness. ENT: Denies any tinnitus, ear pain, hearing loss, or allergies. RESPIRATORY: Denies any cough, wheezing, hemoptysis, or dyspnea. CARDIOVASCULAR: Denies any chest pain, orthopnea, edema, arrhythmia, or palpitations. GASTROINTESTINAL: Denies any nausea, vomiting, diarrhea, constipation, or abdominal pain. GENITOURINARY: Denies any dysuria, hematuria, or renal colic. ENDOCRINE: Denies any polyuria, polydipsia, heat or cold intolerance. HEMATOLOGY: Denies any easy bruising or bleeding diathesis. Daughter reports he has had one episode of anemia where he had upper and lower endoscopy without any positive findings. NEURO: Denies any numbness, dysarthria, epilepsy, or tremors. Has complaints of lightheadedness occasionally upon exertion and had an episode of altered mental status. History of CVA with left side neglect. PSYCH: Appropriate affect, awake and alert x2.   PHYSICAL EXAMINATION:   VITAL SIGNS: Temperature 98.5, pulse 93, respiratory  rate 18, blood pressure 143/66, and saturating 94% on room air.   GENERAL: Elderly male who is comfortable and in no apparent distress.   HEENT: Head atraumatic, normocephalic. Pupils  equal and reactive to light. Pink conjunctiva. Anicteric sclera. Moist oral mucosa.   NECK: Supple. No thyromegaly.   CHEST: Good air entry bilaterally. No wheezing, rales, or rhonchi.   CARDIOVASCULAR: S1 and S2 heard. No rubs, murmurs, or gallops.   ABDOMEN: Soft, nontender, and nondistended. Bowel sounds present.   EXTREMITIES: No edema, no clubbing, and no cyanosis.  NEUROLOGIC: The patient had very mild right facial droop. Motor strength 5/5 in all extremities, but does have some mild left side neglect.   PSYCHIATRIC: Appropriate affect. Awake and alert x2. Aware of his surroundings.   PERTINENT LABS/STUDIES: Glucose 180, BUN 23, creatinine 1.67 sodium 138, potassium 4.1, chloride 104, CO2 20, protein 6.7, albumin 4, alkaline phosphatase 73, AST 15, and ALT 17. Troponin less than 0.02. White blood cell 8.4, hemoglobin 14.3, hematocrit 42.2, and platelets 152.   CT of brain shows no evidence of acute intracranial hemorrhage, mass effect, acute vascularity or infarct. Evidence of chronic ischemia with microvascular distribution and evidence of old right basal ganglia and bilateral cerebral hemisphere old lacunar infarcts.   ASSESSMENT AND PLAN:  1. Episode of responsiveness/syncope, possibly due to arrhythmia versus seizures. The clinical findings goes more towards syncope/presyncope than seizures, but we will order an EEG to rule out any seizures. As well we will admit the patient to telemetry unit to monitor his heart rhythm and heart rate. We will hold his metoprolol, lisinopril, Jalyn, and amlodipine given the fact he had an episode of bradycardia. Consulted and discussed with Dr. Gwen Pounds who will see the patient.  2. Paroxysmal atrial fibrillation. The patient appears to have history of paroxysmal atrial fibrillation. Currently heart rate is controlled. Secondary to his episode of bradycardia, we will hold his metoprolol and Norvasc and discuss with Dr. Gwen Pounds who will see and  evaluate the patient and he will give recommendations regarding starting him on anticoagulation.  3. Cerebrovascular accident. We will continue the patient on statin and aspirin 325 mg.  4. Acute on chronic renal failure. The patient's lisinopril is already held. We will give 1 liter of normal saline at 75 mL/h.  5. Hypertension. We will hold beta blockers and calcium channel blockers secondary to episode of bradycardia. We will hold lisinopril due to acute on chronic renal failure.  6. We will start the patient on subcutaneous heparin for deep vein thrombosis prophylaxis and Protonix for GI prophylaxis.   CODE STATUS: THE PATIENT IS FULL CODE.   The patient and his daughter's questions were answered in full to their satisfaction.   TOTAL TIME SPENT ON PATIENT CARE: 55 minutes. ____________________________ Starleen Arms, MD dse:slb D: 02/08/2012 08:39:17 ET T: 02/08/2012 09:26:15 ET JOB#: [id number]  cc: Starleen Arms, MD, <Dictator> Rhona Leavens. Burnett Sheng, MD Roczen Waymire Teena Irani MD ELECTRONICALLY SIGNED 02/09/2012 16:38

## 2015-02-10 NOTE — Consult Note (Signed)
Referring Physician:  Albertine Patricia   Primary Care Physician:  Albertine Patricia [place], [address], [place], [place] [address], [place] [phone]  Reason for Consult:  Admit Date: 08-Feb-2012   Chief Complaint: AMS   Reason for Consult: altered mental status   History of Present Illness:  History of Present Illness:   74yo RHD M presents yesterday due to altered mental status.  Family states that they found him unresponsive yesterday at home and heard him doing some loud snoring.  Pt does not remember any portions of this yet is fully oriented today.  No witnesses of this episode were present so history was obtained from chart.  Pt denies any current problems.  It is unclear if pt had any incontinence with this episode but there is clearly no tongue biting.  ROS:   General denies complaints    HEENT no complaints    Lungs no complaints    Cardiac no complaints    GI no complaints    GU no complaints    Musculoskeletal no complaints    Extremities no complaints    Skin no complaints    Neuro no complaints    Endocrine no complaints    Psych no complaints   Past Medical/Surgical Hx:  Tobacco Use:   htn:   cva:   Past Medical/ Surgical Hx:   Past Medical History stroke one year ago that affected his L body    Past Surgical History none   Home Medications: Medication Instructions Last Modified Date/Time  Jalyn 0.5 mg-0.4 mg oral capsule 1  orally once a day  22-Apr-13 05:27  lisinopril 10 mg oral tablet 1  orally once a day  22-Apr-13 05:27  aspirin 325 mg oral tablet 1  orally once a day  22-Apr-13 05:27  metoprolol 25 mg tablet 0.5 tab(s) orally 2 times a day  22-Apr-13 05:27  atorvastatin 40 mg oral tablet 1  orally once a day (at bedtime)  22-Apr-13 05:27  amlodipine 5 mg oral tablet 1 tab(s) orally once a day 22-Apr-13 05:27  KCl-10 1   once a day 22-Apr-13 05:27   Allergies:  Penicillin:  Unknown  Social/Family History:  Employment Status: retired   Lives With: children   Living Arrangements: house   Social History: quit smoking a few years ago, no illicits or EtOH, retired Art gallery manager   Family History: no seizures   Vital Signs: **Vital Signs.:   23-Apr-13 08:00   Vital Signs Type Q 4hr   Temperature Temperature (F) 98.3   Celsius 36.8   Temperature Source oral   Pulse Pulse 67   Pulse source per Dinamap   Respirations Respirations 16   Systolic BP Systolic BP 277   Diastolic BP (mmHg) Diastolic BP (mmHg) 70   Mean BP 99   BP Source Dinamap   Pulse Ox % Pulse Ox % 96   Pulse Ox Activity Level  At rest   Oxygen Delivery Room Air/ 21 %   Physical Exam:  General: well nourished, no acute distress   HEENT: normocephalic, sclera nonicteric, PERRLA, oropharynx clear, moist mucosa   Neck: supple, no JVD, no bruits   Chest: CTA B, no wheezes, good movement   Cardiac: RRR, no murmurs, 2+ pulses   Extremities: no C/C/E, FROM of all   Neurologic Exam:  Mental Status: A+Ox4, nl speech and language   Cranial Nerves: pupils 72mm B and reactive, EOMI, PERRLA, impossible to tell visual fields  because pt constantly looks to finger but could have a R homonymous hemianopsia to threat, clearly visual neglect on the L, L facial droop, tongue midline, shoulder shrug equal   Motor Exam: 5/5 except drift in L UE   Deep Tendon Reflexes: 3+ B except for slight clonus in L patellar, plantars mute bilateral   Sensory Exam: intact to temp, vibration and light touch,  neglect of the L hemibody   Coordination: mild dysmetria on L hemibody which is most likely secondary to weakness   Cerebellar Exam: RAM ok   Gait: deferred   Lab Results: Thyroid:  22-Apr-13 13:41    Thyroid Stimulating Hormone 2.69  Hepatic:  23-Apr-13 04:54    Bilirubin, Total 0.6   Alkaline Phosphatase 63   SGPT (ALT) 15   SGOT (AST) 24   Total Protein, Serum 6.4   Albumin, Serum 3.5  Routine Chem:  23-Apr-13  04:54    Magnesium, Serum 2.0   Glucose, Serum 98   BUN   22   Creatinine (comp)   1.52   Sodium, Serum 143   Potassium, Serum   3.3   Chloride, Serum   108   CO2, Serum 26   Calcium (Total), Serum 8.5   Osmolality (calc) 288   eGFR (African American)   53   eGFR (Non-African American)   45   Anion Gap 9   Cholesterol, Serum 104   Triglycerides, Serum 66   HDL (INHOUSE) 57   VLDL Cholesterol Calculated 13   LDL Cholesterol Calculated 34  Cardiac:  22-Apr-13 05:23    CK, Total 115   CPK-MB, Serum 1.9    21:21    Troponin I < 0.02  Routine UA:  22-Apr-13 08:12    Color (UA) Yellow   Clarity (UA) Clear   Glucose (UA) 50 mg/dL   Bilirubin (UA) Negative   Ketones (UA) Negative   Specific Gravity (UA) 1.017   Blood (UA) 1+   pH (UA) 5.0   Nitrite (UA) Negative   Leukocyte Esterase (UA) Negative   RBC (UA) <1 /HPF   WBC (UA) 1 /HPF  Routine Hem:  23-Apr-13 04:54    WBC (CBC) 9.3   RBC (CBC)   4.26   Hemoglobin (CBC) 13.6   Hematocrit (CBC)   38.9   Platelet Count (CBC)   146   MCV 91   MCH 31.9   MCHC 35.0   RDW 13.9   Neutrophil % 76.5   Lymphocyte % 13.6   Monocyte % 8.6   Eosinophil % 0.7   Basophil % 0.6   Neutrophil #   7.1   Lymphocyte # 1.3   Monocyte # 0.8   Eosinophil # 0.1   Basophil # 0.1   Radiology Results: CT:    22-Apr-13 06:09, CT Head Without Contrast   CT Head Without Contrast    REASON FOR EXAM:    sudden non-veral, snoring resp, hx of CVA, AMS  COMMENTS:       PROCEDURE: CT  - CT HEAD WITHOUT CONTRAST  - Feb 08 2012  6:09AM     RESULT:     TECHNIQUE: Helical noncontrast 5 mm sections were obtained from the skull   base to vertex.     Comparison is made to a previous study dated 10/25/2010.    FINDINGS: There is diffuse severe cortical atrophy and moderate to severe   diffuse areas of low attenuation within the subcortical, deep, and   periventricular white matter regions.There  are findings likely     reflecting  encephalomalacic change along the posterior vertex of the   right and left frontal lobes. An area of encephalomalacic change is also   appreciated involving the insular region on the right as well as likely   the medial aspects of the posterior parietal regions in a parafalcine   location. There is no evidence of acute hemorrhage or mass effect. There   is no evidence of a depressed skull fracture. The visualized paranasal   sinuses and master air cells are patent.    IMPRESSION:      1.  Areas of chronic lacunar infarction.  2.  Severe involutional changes.  3.  No evidence of acute abnormalities.  4.  Dr. Prince Rome of the Emergency Department was informed of these findings   via a preliminary faxed report.  Thank you for this opportunity to contribute to the care of your patient.           Verified By: Mikki Santee, M.D., MD   Impression/Recommendations:  Recommendations:   personal review of CT of head without contrast shows severe atrophy, moderate WM's with multiple lacunar infarcts R > L and an old R MCA infarct  AMS-  agree that this was probably a seizure given history and quick resolution.  EEG did show one epileptiform discharge out of opposite hemisphere not R. old R MCA infarct-  stable, etiology unknown at this time questionable atrial fibrillation-  none recorded here but pt is on insuffient stroke coverage if he does have this hypertension-  controlled  agree with MRI given multiple findings on exam continue ASA $RemoveBefo'325mg'ukXjKnOiwyp$  for now until afib history is sorted out if pt does have atrial fibrillation, will need to start coumadin with goal INR 2-3 or a newer anticoagulant continue statin with goal LDL < 100 would wean Keppra start dilantin $RemoveBefore'20mg'ddRpbluHoOUiP$ /kg then $Remove'300mg'lzPBDQK$  qhs, will check level in am d/c tomorrow pending MRI.  Electronic Signatures: Jamison Neighbor (MD)  (Signed 23-Apr-13 11:34)  Authored: REFERRING PHYSICIAN, Primary Care Physician, Consult, History of Present Illness,  Review of Systems, PAST MEDICAL/SURGICAL HISTORY, HOME MEDICATIONS, ALLERGIES, Social/Family History, NURSING VITAL SIGNS, Physical Exam-, LAB RESULTS, RADIOLOGY RESULTS, Recommendations   Last Updated: 23-Apr-13 11:34 by Jamison Neighbor (MD)

## 2015-02-10 NOTE — Consult Note (Signed)
PATIENT NAME:  SADAO, WEYER MR#:  [id number] DATE OF BIRTH:  05/20/1941  DATE OF CONSULTATION:  02/08/2012  REFERRING PHYSICIAN:   CONSULTING PHYSICIAN:  Corey Skains, MD  PRIMARY CARE PHYSICIAN: [place] Pink, MD  REASON FOR CONSULTATION: Stroke.  HISTORY OF PRESENT ILLNESS: This is a 74 year old male with known previous stroke, left-sided weakness, diabetes mellitus, hypertension, and hyperlipidemia. Upon arrival he had strokelike symptoms concerning for further cardiovascular disease and had some continued weakness. He has been having some worsening shortness of breath at this time for which currently there is no evidence of acute myocardial infarction with a normal troponin. EKG shows atrial fibrillation with controlled ventricular rate with preventricular contractions as well as nonspecific ST and T wave changes. His atrial fibrillation is a new diagnosis and may be consistent with previous stroke. He has not been on anticoagulation in the past. The patient does have some apparent previous bleeding issues for which he has had anemia, bleeding, and bowel abnormalities. Therefore, the patient has had no anticoagulation. He does have some moderate right chronic kidney disease as well. Current blood pressure is elevated at 152/76, previously on appropriate medications, and currently on lipid management as well including atorvastatin. The patient feels relatively well at this time and will be observed for further stroke risk.  REVIEW OF SYSTEMS: The remainder of review of systems is negative for vision change, ringing in the ears, hearing loss, cough, congestion, heartburn, nausea, vomiting, diarrhea, bloody stools, stomach pain, extremity pain, leg weakness, cramping of the buttocks, known blood clots, headaches, blackouts, dizzy spells, nosebleed, congestion, trouble swallowing, frequent urination, urination at night, muscle weakness, numbness, anxiety, depression, skin lesions, or skin rashes.    PAST MEDICAL HISTORY:  1. Stroke. 2. Peripheral vascular disease.  3. Hypertension.  4. Hyperlipidemia.  5. Diabetes mellitus.  6. Atrial fibrillation.   FAMILY HISTORY: No family members with early onset of cardiovascular disease or hypertension.   SOCIAL HISTORY: The patient currently denies alcohol or tobacco use.   ALLERGIES: No known drug allergies.  CURRENT MEDICATIONS: As listed.   PHYSICAL EXAMINATION:   VITAL SIGNS: Blood pressure is 126/68 bilaterally and heart rate 70 upright, reclining, and irregular.   GENERAL: He is a well-appearing elderly male in no acute distress.   HEENT: No icterus, thyromegaly, ulcers, hemorrhage, or xanthelasma.   HEART: Irregularly irregular with normal S1 and S2. 2/6 apical murmur consistent with mitral regurgitation. Point of maximal impulse is diffuse. Carotid upstroke is normal without bruit. Jugular venous pressure is normal.   LUNGS: Lungs are clear to auscultation with normal respirations.   ABDOMEN: Soft and nontender without hepatosplenomegaly or masses. Abdominal aorta is normal size without bruit.   EXTREMITIES: 2+ pulses in dorsal, pedal, radial, and femoral arteries without lower extremity edema, cyanosis, clubbing, or ulcers.   NEUROLOGIC: The patient is oriented to time, place, and person with normal mood and affect.   ASSESSMENT: This is a 74 year old male with previous stroke, left-sided weakness, diabetes mellitus, hypertension, hyperlipidemia, and atrial fibrillation with controlled ventricular rate and preventricular contractions needing further treatment options.     RECOMMENDATIONS:  1. Begin anticoagulation with heparin for further risk reduction and stroke with atrial fibrillation unless contraindicated after CT scan and hemorrhagic stroke.  2. Would consider further anticoagulation with apixaban 5 mg twice per day to reduce possible bleeding complications, although to improve risks of stroke with atrial  fibrillation and previous ischemic stroke.  3. Echocardiogram for LV systolic dysfunction and valvular heart disease  contributing to above.  4. CT scan to assess for possible true ischemic stroke and use aspirin 81 mg for that. 5. Lipid management with atorvastatin 40 mg for a goal LDL below 70 mg/dL.  6. Diabetes mellitus control with goal hemoglobin A1c below 7. 7. Consider beta blocker for heart rate control, PVC control and hypertension control.  8. Further treatment options and/or other diagnostic testing after above.  ____________________________ Corey Skains, MD bjk:slb D: 02/08/2012 13:40:37 ET T: 02/08/2012 13:57:11 ET JOB#: [id number]  cc: Corey Skains, MD, <Dictator> Irven Easterly. Kary Kos, MD Corey Skains MD ELECTRONICALLY SIGNED 02/09/2012 7:58

## 2015-02-10 NOTE — Discharge Summary (Signed)
PATIENT NAME:  Ian Townsend, Ian Townsend MR#:  [id number] DATE OF BIRTH:  [birth year]  DATE OF ADMISSION:  02/08/2012 DATE OF DISCHARGE:  02/12/2012  FINAL DIAGNOSES:  1. Encephalopathy, suspected seizure, postictal period which resolved.  2. Atrial fibrillation with slow ventricular response, resolved off beta blocker.  3. Hypertension.  4. Hyperlipidemia.  5. Benign prostatic hypertrophy.  6. Peripheral vascular disease.  7. Cerebrovascular accident with right MCA distribution, left neglect.  8. Chronic obstructive pulmonary disease.  9. Chronic kidney disease stage III.  10. Ambulatory dysfunction and balance problems.  11. Acidosis, which resolved.  12. Upper respiratory tract infection.   MEDICATIONS ON DISCHARGE:  1. Jalyn 0.5 - 0.4, 1 capsule daily.  2. Aspirin 325 mg daily.  3. Levaquin 250 mg p.o. daily for nine more days.  4. Seroquel 25 mg p.o. nightly as needed only for insomnia.  5. Lipitor 20 mg p.o. nightly.  6. Dilantin 300 mg p.o. at bedtime. 7. Cepacol throat lozenge 1 p.o. every hour as needed for sore throat.  8. Colace 100 mg p.o. twice a day.  9. Hydrocortisone 0.5% cream topically twice a day as needed for itching.  10. Lisinopril 20 mg p.o. daily.  11. Do not take metoprolol or rate controlling medications.   DIET: Low sodium diet, low fat diet.   ACTIVITY: As tolerated.   REFERRALS: Physical therapy.   FOLLOW UP: Follow up in 1 to 2 days with doctor at rehab. Follow up with Dr. Nehemiah Massed, cardiology, in 2 to 4 weeks.   REASON FOR ADMISSION: Patient was admitted 02/08/2012, discharged to rehab 02/12/2012. Patient had an episode where he had loud snoring, went in the room, tried to wake him up, unable to get any response, eyes rolled back into the head, saw some foam at the mouth. In the ER found to have a heart rate in the 30s. Neurological consultation was ordered for possible seizure. Beta blocker was held with the bradycardia.   LABORATORY, DIAGNOSTIC, AND  RADIOLOGICAL DATA: Glucose 180, BUN 23, creatinine 1.67, sodium 138, potassium 4.1, chloride 104, CO2 20, calcium 8.2. Liver function tests normal. GFR 41. White blood cell count 8.4, hemoglobin and hematocrit 14.3 and 42.2, platelet count 152. Troponin negative. EKG showed atrial fibrillation 98 beats per minute. CT scan of the head showed areas of chronic lacunar infarction. Chest x-ray shows interstitial infiltrate likely representing pulmonary edema. Urinalysis 1+ blood. Troponin negative. ABG shows pH 7.40, pCO2 39, pO2 87, bicarbonate 24.2, oxygen saturation 98.3. Echocardiogram showed an ejection fraction of 65%, left atrium moderately dilated, right atrium moderately dilated, moderate mitral regurgitation. Next troponin negative. LDL 34, HDL 57, triglycerides 66. MRI of the brain showed no acute intracranial abnormality. Dilantin level on the 14th was 11.8, was 14.2 on the 25th. Creatinine 1.7. INR on the 26th 1.8. Dilantin level on the 26th 11.4. The patient had an EEG that showed abnormal awake sleep EEG with right hemispheric suppression and one isolated epileptiform discharge.   CONSULTATIONS DURING THE HOSPITAL STAY:  1. Neurology, Dr. Valora Corporal. 2. Cardiology, Dr. Nehemiah Massed. 3. Physical therapy.   HOSPITAL COURSE PER PROBLEM LIST:  1. For the patient's encephalopathy, possible seizure, patient was seen in consultation by neurology who thinks this is a seizure. Started on Dilantin, now currently on Dilantin 300 mg nightly. Mental status has improved.  2. For the atrial fibrillation, patient had a heart rate in the 30s when he came in. Beta blocker held. A small trial of restarting a lower dose beta  blocker was started again and heart rate went down into the 40s so beta blocker contraindicated at this point. Case discussed with Dr. Nehemiah Massed. Patient stable for discharge out of the hospital on no rate controlling medication secondary to bradycardia. He does not think that this is a sick sinus  syndrome.  3. Hypertension. Blood pressure upon discharge 130/77. Patient's lisinopril was increased to 20 mg daily.  4. Hyperlipidemia. Lipid profile acceptable on Lipitor 20 mg at bedtime.  5. Benign prostatic hypertrophy. Patient is on Jalyn daily 0.5 mg to 0.4 mg daily.  6. For peripheral vascular disease, on aspirin.  7. For cerebrovascular accident right MCA distribution with left neglect, patient's balance is off with physical therapy with this last episode. Will need rehab. On aspirin at this time. In discussing Coumadin with the patient and family we are hesitant to add Coumadin at this time secondary to fall risk and balance issues. Coumadin was stopped and vitamin K given. Patient will be on aspirin only for anticoagulation. With history of cerebrovascular accident and atrial fibrillation his stroke in the past was probably embolic. Higher risk of stroke explained.  8. Chronic obstructive pulmonary disease. Respiratory status stable.  9. Chronic kidney disease stage III. Stable. Recommend checking a BMP in one week.  10. Ambulatory dysfunction and balance problems. Will benefit from rehab since the patient was ambulatory at home with a cane prior to coming in.  11. Acidosis, resolved.  12. Upper respiratory tract infection. Will give Levaquin 250 mg for nine more days. 13. For the patient's INR being 1.8 I did give him vitamin K today and stopped the Coumadin.   SPENT ON PATIENT CARE TODAY: Over 60 minutes.   ____________________________ Tana Conch. Leslye Peer, MD rjw:cms D: 02/12/2012 15:07:21 ET T: 02/12/2012 15:51:21 ET JOB#: [id number]  cc: Tana Conch. Leslye Peer, MD, <Dictator> Marisue Brooklyn MD ELECTRONICALLY SIGNED 02/14/2012 13:56

## 2015-02-18 ENCOUNTER — Encounter: Payer: Self-pay | Admitting: *Deleted

## 2015-02-21 ENCOUNTER — Encounter
Admission: RE | Disposition: A | Discharge status: Home / Self Care | Payer: Self-pay | Source: Ambulatory Visit | Attending: Gastroenterology

## 2015-02-21 ENCOUNTER — Ambulatory Visit: Payer: Medicare Other | Admitting: Anesthesiology

## 2015-02-21 ENCOUNTER — Ambulatory Visit
Admission: RE | Admit: 2015-02-21 | Discharge: 2015-02-21 | Disposition: A | Discharge status: Home / Self Care | Payer: Medicare Other | Source: Ambulatory Visit | Attending: Gastroenterology | Admitting: Gastroenterology

## 2015-02-21 DIAGNOSIS — F17200 Nicotine dependence, unspecified, uncomplicated: Secondary | ICD-10-CM | POA: Diagnosis not present

## 2015-02-21 DIAGNOSIS — J449 Chronic obstructive pulmonary disease, unspecified: Secondary | ICD-10-CM | POA: Insufficient documentation

## 2015-02-21 DIAGNOSIS — K449 Diaphragmatic hernia without obstruction or gangrene: Secondary | ICD-10-CM | POA: Diagnosis not present

## 2015-02-21 DIAGNOSIS — E785 Hyperlipidemia, unspecified: Secondary | ICD-10-CM | POA: Insufficient documentation

## 2015-02-21 DIAGNOSIS — K2980 Duodenitis without bleeding: Secondary | ICD-10-CM | POA: Insufficient documentation

## 2015-02-21 DIAGNOSIS — Z880 Allergy status to penicillin: Secondary | ICD-10-CM | POA: Insufficient documentation

## 2015-02-21 DIAGNOSIS — Z79899 Other long term (current) drug therapy: Secondary | ICD-10-CM | POA: Diagnosis not present

## 2015-02-21 DIAGNOSIS — K2970 Gastritis, unspecified, without bleeding: Secondary | ICD-10-CM | POA: Insufficient documentation

## 2015-02-21 DIAGNOSIS — Z7982 Long term (current) use of aspirin: Secondary | ICD-10-CM | POA: Diagnosis not present

## 2015-02-21 DIAGNOSIS — I480 Paroxysmal atrial fibrillation: Secondary | ICD-10-CM | POA: Diagnosis not present

## 2015-02-21 DIAGNOSIS — N183 Chronic kidney disease, stage 3 (moderate): Secondary | ICD-10-CM | POA: Insufficient documentation

## 2015-02-21 DIAGNOSIS — I739 Peripheral vascular disease, unspecified: Secondary | ICD-10-CM | POA: Diagnosis not present

## 2015-02-21 DIAGNOSIS — I509 Heart failure, unspecified: Secondary | ICD-10-CM | POA: Insufficient documentation

## 2015-02-21 DIAGNOSIS — N400 Enlarged prostate without lower urinary tract symptoms: Secondary | ICD-10-CM | POA: Insufficient documentation

## 2015-02-21 DIAGNOSIS — Z8673 Personal history of transient ischemic attack (TIA), and cerebral infarction without residual deficits: Secondary | ICD-10-CM | POA: Diagnosis not present

## 2015-02-21 DIAGNOSIS — I129 Hypertensive chronic kidney disease with stage 1 through stage 4 chronic kidney disease, or unspecified chronic kidney disease: Secondary | ICD-10-CM | POA: Diagnosis not present

## 2015-02-21 DIAGNOSIS — K219 Gastro-esophageal reflux disease without esophagitis: Secondary | ICD-10-CM | POA: Diagnosis not present

## 2015-02-21 DIAGNOSIS — K222 Esophageal obstruction: Secondary | ICD-10-CM | POA: Diagnosis not present

## 2015-02-21 DIAGNOSIS — Z9889 Other specified postprocedural states: Secondary | ICD-10-CM | POA: Insufficient documentation

## 2015-02-21 DIAGNOSIS — R112 Nausea with vomiting, unspecified: Secondary | ICD-10-CM | POA: Diagnosis present

## 2015-02-21 HISTORY — PX: ESOPHAGOGASTRODUODENOSCOPY: SHX5428

## 2015-02-21 HISTORY — DX: Other amnesia: R41.3

## 2015-02-21 HISTORY — DX: Neurologic neglect syndrome: R41.4

## 2015-02-21 SURGERY — EGD (ESOPHAGOGASTRODUODENOSCOPY)
Anesthesia: Monitor Anesthesia Care | Wound class: Clean Contaminated

## 2015-02-21 MED ORDER — SODIUM CHLORIDE 0.9 % IV SOLN: INTRAVENOUS | Status: DC

## 2015-02-21 MED ORDER — PROPOFOL 10 MG/ML IV BOLUS
INTRAVENOUS | Status: DC | PRN
  Administered 2015-02-21 (×3): 20 mg via INTRAVENOUS
  Administered 2015-02-21: 60 mg via INTRAVENOUS
  Administered 2015-02-21: 20 mg via INTRAVENOUS

## 2015-02-21 MED ORDER — GLYCOPYRROLATE 0.2 MG/ML IJ SOLN
INTRAMUSCULAR | Status: DC | PRN
  Administered 2015-02-21: 0.2 mg via INTRAVENOUS

## 2015-02-21 MED ORDER — ASPIRIN 325 MG PO TABS: 325.0000 mg | ORAL_TABLET | Freq: Every day | ORAL | Status: DC

## 2015-02-21 MED ORDER — LIDOCAINE HCL (CARDIAC) 20 MG/ML IV SOLN
INTRAVENOUS | Status: DC | PRN
  Administered 2015-02-21: 30 mg via INTRAVENOUS

## 2015-02-21 MED ORDER — LACTATED RINGERS IV SOLN
INTRAVENOUS | Status: DC
  Administered 2015-02-21: 08:00:00 via INTRAVENOUS

## 2015-02-21 MED ORDER — STERILE WATER FOR IRRIGATION IR SOLN
Status: DC | PRN
  Administered 2015-02-21: 50 mL

## 2015-02-21 MED ORDER — METOPROLOL TARTRATE 1 MG/ML IV SOLN
INTRAVENOUS | Status: DC | PRN
  Administered 2015-02-21: 1 mg via INTRAVENOUS

## 2015-02-21 SURGICAL SUPPLY — 39 items
54450 ×3 IMPLANT
BALLN DILATOR 10-12 8 (BALLOONS)
BALLN DILATOR 12-15 8 (BALLOONS) ×3
BALLN DILATOR 15-18 8 (BALLOONS)
BALLN DILATOR CRE 0-12 8 (BALLOONS)
BALLN DILATOR ESOPH 8 10 CRE (MISCELLANEOUS) IMPLANT
BALLOON DILATOR 12-15 8 (BALLOONS) IMPLANT
BALLOON DILATOR 15-18 8 (BALLOONS) IMPLANT
BALLOON DILATOR CRE 0-12 8 (BALLOONS) IMPLANT
BLOCK BITE 60FR ADLT L/F GRN (MISCELLANEOUS) ×3 IMPLANT
CANISTER SUCT 1200ML W/VALVE (MISCELLANEOUS) ×3 IMPLANT
FCP ESCP3.2XJMB 240X2.8X (MISCELLANEOUS) ×1
FORCEPS BIOP RAD 4 LRG CAP 4 (CUTTING FORCEPS) IMPLANT
FORCEPS BIOP RJ4 240 W/NDL (MISCELLANEOUS) ×3
FORCEPS ESCP3.2XJMB 240X2.8X (MISCELLANEOUS) IMPLANT
GOWN CVR UNV OPN BCK APRN NK (MISCELLANEOUS) ×2 IMPLANT
GOWN ISOL THUMB LOOP REG UNIV (MISCELLANEOUS) ×6
HEMOCLIP INSTINCT (CLIP) IMPLANT
INJECTOR VARIJECT VIN23 (MISCELLANEOUS) IMPLANT
KIT CO2 TUBING (TUBING) ×3 IMPLANT
KIT DEFENDO VALVE AND CONN (KITS) IMPLANT
KIT ENDO PROCEDURE OLY (KITS) ×3 IMPLANT
LIGATOR MULTIBAND 6SHOOTER MBL (MISCELLANEOUS) IMPLANT
MARKER SPOT ENDO TATTOO 5ML (MISCELLANEOUS) IMPLANT
PAD GROUND ADULT SPLIT (MISCELLANEOUS) IMPLANT
SNARE SHORT THROW 13M SML OVAL (MISCELLANEOUS) IMPLANT
SNARE SHORT THROW 30M LRG OVAL (MISCELLANEOUS) IMPLANT
SPOT EX ENDOSCOPIC TATTOO (MISCELLANEOUS)
SUCTION POLY TRAP 4CHAMBER (MISCELLANEOUS) IMPLANT
SYR INFLATION 60ML (SYRINGE) ×2 IMPLANT
TRAP SUCTION POLY (MISCELLANEOUS) IMPLANT
TUBING CONN 6MMX3.1M (TUBING)
TUBING SUCTION CONN 0.25 STRL (TUBING) IMPLANT
UNDERPAD 30X60 958B10 (PK) (MISCELLANEOUS) IMPLANT
VALVE BIOPSY ENDO (VALVE) IMPLANT
VARIJECT INJECTOR VIN23 (MISCELLANEOUS)
WATER AUXILLARY (MISCELLANEOUS) IMPLANT
WATER STERILE IRR 500ML POUR (IV SOLUTION) ×3 IMPLANT
WIRE CRE 18-20MM 8CM F G (MISCELLANEOUS) IMPLANT

## 2015-02-21 NOTE — Anesthesia Preprocedure Evaluation (Addendum)
Anesthesia Evaluation  Patient identified by MRN, date of birth, ID band  Reviewed: Allergy & Precautions, H&P , NPO status , Patient's Chart, lab work & pertinent test results  Airway Mallampati: II  TM Distance: >3 FB Neck ROM: full    Dental  (+) Edentulous Upper, Edentulous Lower   Pulmonary COPDCurrent Smoker,    Pulmonary exam normal       Cardiovascular hypertension, + Peripheral Vascular Disease Normal cardiovascular exam+ dysrhythmias (parox afib, SSS) Atrial Fibrillation  Lipids    Neuro/Psych Memory deficit  TIACVA (Left weak; 2 years ago), Residual Symptoms    GI/Hepatic GERD-  Controlled,  Endo/Other    Renal/GU CRFRenal disease     Musculoskeletal   Abdominal   Peds  Hematology   Anesthesia Other Findings Ekg: afib with pvcs;  Echo: 2013: ef 65%; TR;  Reproductive/Obstetrics                            Anesthesia Physical Anesthesia Plan  ASA: III  Anesthesia Plan: MAC   Post-op Pain Management:    Induction:   Airway Management Planned:   Additional Equipment:   Intra-op Plan:   Post-operative Plan:   Informed Consent: I have reviewed the patients History and Physical, chart, labs and discussed the procedure including the risks, benefits and alternatives for the proposed anesthesia with the patient or authorized representative who has indicated his/her understanding and acceptance.     Plan Discussed with: CRNA  Anesthesia Plan Comments:         Anesthesia Quick Evaluation

## 2015-02-21 NOTE — Op Note (Signed)
[facility] Gastroenterology Patient Name: Yosgart Pavey Procedure Date: 02/21/2015 7:12 AM MRN: [id number] Account #: [id number] Date of Birth: 08-Feb-1941 Admit Type: Outpatient Age: 74 Room: [facility] OR ROOM 01 Gender: Male Note Status: Finalized Procedure:         Upper GI endoscopy Indications:       Nausea with vomiting Providers:         Lucilla Lame, MD Referring MD:      Irven Easterly. Kary Kos, MD (Referring MD) Medicines:         Propofol per Anesthesia Complications:     No immediate complications. Procedure:         Pre-Anesthesia Assessment:                    - Prior to the procedure, a History and Physical was                     performed, and patient medications and allergies were                     reviewed. The patient's tolerance of previous anesthesia                     was also reviewed. The risks and benefits of the procedure                     and the sedation options and risks were discussed with the                     patient. All questions were answered, and informed consent                     was obtained. Prior Anticoagulants: The patient has taken                     no previous anticoagulant or antiplatelet agents. ASA                     Grade Assessment: III - A patient with severe systemic                     disease. After reviewing the risks and benefits, the                     patient was deemed in satisfactory condition to undergo                     the procedure.                    After obtaining informed consent, the endoscope was passed                     under direct vision. Throughout the procedure, the                     patient's blood pressure, pulse, and oxygen saturations                     were monitored continuously. The Olympus GIF-HQ190                     Endoscope (S#. [id number]) was introduced through the mouth,  and advanced to the second part of duodenum. The upper GI                      endoscopy was accomplished without difficulty. The patient                     tolerated the procedure well. Findings:      A medium-sized hiatus hernia was present.      A benign-appearing, intrinsic moderate stenosis measuring 1 cm (inner       diameter) was found at the gastroesophageal junction and was traversed.       A TTS dilator was passed through the scope. Dilation with a 12-13.5-15       mm balloon (to a maximum balloon size of 15 mm) dilator was performed.       Biopsies were taken with a cold forceps for histology.      Localized minimal inflammation characterized by erythema was found in       the gastric antrum.      Localized mild inflammation characterized by erythema was found in the       duodenal bulb. Impression:        - Medium-sized hiatus hernia.                    - Benign-appearing esophageal stricture. Dilated. Biopsied.                    - Gastritis.                    - Duodenitis. Recommendation:    - Await pathology results.                    - Repeat the upper endoscopy in 4 weeks for retreatment. Procedure Code(s): --- Professional ---                    [phone], Esophagogastroduodenoscopy, flexible, transoral;                     with transendoscopic balloon dilation of esophagus (less                     than 30 mm diameter)                    43239, Esophagogastroduodenoscopy, flexible, transoral;                     with biopsy, single or multiple Diagnosis Code(s): --- Professional ---                    R11.2, Nausea with vomiting, unspecified                    K22.2, Esophageal obstruction                    K29.70, Gastritis, unspecified, without bleeding                    K29.80, Duodenitis without bleeding CPT copyright 2014 American Medical Association. All rights reserved. The codes documented in this report are preliminary and upon coder review may  be revised to meet current compliance requirements. Lucilla Lame, MD 02/21/2015 8:37:36  AM This report has been signed electronically. Number of Addenda: 0 Note Initiated On: 02/21/2015 7:12 AM Total Procedure Duration:  0 hours 6 minutes 16 seconds       [facility]

## 2015-02-21 NOTE — Anesthesia Postprocedure Evaluation (Signed)
  Anesthesia Post-op Note  Patient: Ian Townsend  Procedure(s) Performed: Procedure(s): ESOPHAGOGASTRODUODENOSCOPY (EGD) (N/A)  Anesthesia type:MAC  Patient location: PACU  Post pain: Pain level controlled  Post assessment: Post-op Vital signs reviewed, Patient's Cardiovascular Status Stable, Respiratory Function Stable, Patent Airway and No signs of Nausea or vomiting  Post vital signs: Reviewed and stable  Last Vitals:  Filed Vitals:   02/21/15 0915  BP: 166/83  Pulse: 75  Temp:   Resp: 16    Level of consciousness: awake, alert  and patient cooperative  Complications: No apparent anesthesia complications

## 2015-02-21 NOTE — Transfer of Care (Signed)
Immediate Anesthesia Transfer of Care Note  Patient: Ian Townsend  Procedure(s) Performed: Procedure(s): ESOPHAGOGASTRODUODENOSCOPY (EGD) (N/A)  Patient Location: PACU  Anesthesia Type: MAC  Level of Consciousness: awake, alert  and patient cooperative  Airway and Oxygen Therapy: Patient Spontanous Breathing and Patient connected to supplemental oxygen  Post-op Assessment: Post-op Vital signs reviewed, Patient's Cardiovascular Status Stable, Respiratory Function Stable, Patent Airway and No signs of Nausea or vomiting  Post-op Vital Signs: Reviewed and stable  Complications: No apparent anesthesia complications
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
## 2015-02-21 NOTE — Anesthesia Procedure Notes (Signed)
Procedure Name: MAC Performed by: Rilee Wendling Pre-anesthesia Checklist: Patient identified, Emergency Drugs available, Suction available, Patient being monitored and Timeout performed Patient Re-evaluated:Patient Re-evaluated prior to inductionOxygen Delivery Method: Nasal cannula       

## 2015-02-27 ENCOUNTER — Encounter: Payer: Self-pay | Admitting: Gastroenterology

## 2015-03-07 ENCOUNTER — Encounter: Payer: Self-pay | Admitting: Gastroenterology

## 2015-03-13 ENCOUNTER — Emergency Department: Payer: Medicare Other

## 2015-03-13 ENCOUNTER — Inpatient Hospital Stay (HOSPITAL_COMMUNITY)
Admit: 2015-03-13 | Discharge: 2015-03-13 | Disposition: A | Discharge status: Home / Self Care | Payer: Medicare Other | Attending: Internal Medicine | Admitting: Internal Medicine

## 2015-03-13 ENCOUNTER — Encounter: Payer: Self-pay | Admitting: Emergency Medicine

## 2015-03-13 ENCOUNTER — Inpatient Hospital Stay
Admission: EM | Admit: 2015-03-13 | Discharge: 2015-03-15 | DRG: 189 | Disposition: A | Discharge status: Home / Self Care | Payer: Medicare Other | Attending: Specialist | Admitting: Specialist

## 2015-03-13 DIAGNOSIS — J9621 Acute and chronic respiratory failure with hypoxia: Principal | ICD-10-CM | POA: Diagnosis present

## 2015-03-13 DIAGNOSIS — N400 Enlarged prostate without lower urinary tract symptoms: Secondary | ICD-10-CM | POA: Diagnosis present

## 2015-03-13 DIAGNOSIS — E785 Hyperlipidemia, unspecified: Secondary | ICD-10-CM | POA: Diagnosis present

## 2015-03-13 DIAGNOSIS — I739 Peripheral vascular disease, unspecified: Secondary | ICD-10-CM | POA: Diagnosis present

## 2015-03-13 DIAGNOSIS — Z7982 Long term (current) use of aspirin: Secondary | ICD-10-CM

## 2015-03-13 DIAGNOSIS — I69398 Other sequelae of cerebral infarction: Secondary | ICD-10-CM

## 2015-03-13 DIAGNOSIS — K219 Gastro-esophageal reflux disease without esophagitis: Secondary | ICD-10-CM | POA: Diagnosis present

## 2015-03-13 DIAGNOSIS — I129 Hypertensive chronic kidney disease with stage 1 through stage 4 chronic kidney disease, or unspecified chronic kidney disease: Secondary | ICD-10-CM | POA: Diagnosis present

## 2015-03-13 DIAGNOSIS — I480 Paroxysmal atrial fibrillation: Secondary | ICD-10-CM | POA: Diagnosis present

## 2015-03-13 DIAGNOSIS — N183 Chronic kidney disease, stage 3 (moderate): Secondary | ICD-10-CM | POA: Diagnosis present

## 2015-03-13 DIAGNOSIS — I482 Chronic atrial fibrillation: Secondary | ICD-10-CM | POA: Diagnosis present

## 2015-03-13 DIAGNOSIS — I509 Heart failure, unspecified: Secondary | ICD-10-CM | POA: Diagnosis present

## 2015-03-13 DIAGNOSIS — I4891 Unspecified atrial fibrillation: Secondary | ICD-10-CM | POA: Diagnosis not present

## 2015-03-13 DIAGNOSIS — Z9181 History of falling: Secondary | ICD-10-CM

## 2015-03-13 DIAGNOSIS — F17290 Nicotine dependence, other tobacco product, uncomplicated: Secondary | ICD-10-CM | POA: Diagnosis present

## 2015-03-13 DIAGNOSIS — Z880 Allergy status to penicillin: Secondary | ICD-10-CM | POA: Diagnosis not present

## 2015-03-13 DIAGNOSIS — R414 Neurologic neglect syndrome: Secondary | ICD-10-CM | POA: Diagnosis present

## 2015-03-13 DIAGNOSIS — Z79899 Other long term (current) drug therapy: Secondary | ICD-10-CM | POA: Diagnosis not present

## 2015-03-13 DIAGNOSIS — G40909 Epilepsy, unspecified, not intractable, without status epilepticus: Secondary | ICD-10-CM | POA: Diagnosis present

## 2015-03-13 DIAGNOSIS — J441 Chronic obstructive pulmonary disease with (acute) exacerbation: Secondary | ICD-10-CM | POA: Diagnosis present

## 2015-03-13 HISTORY — DX: Nonrheumatic mitral (valve) insufficiency: I34.0

## 2015-03-13 HISTORY — DX: Chronic atrial fibrillation, unspecified: I48.20

## 2015-03-13 LAB — BASIC METABOLIC PANEL
ANION GAP: 8 (ref 5–15)
BUN: 22 mg/dL — AB (ref 6–20)
CALCIUM: 9.2 mg/dL (ref 8.9–10.3)
CO2: 28 mmol/L (ref 22–32)
Chloride: 104 mmol/L (ref 101–111)
Creatinine, Ser: 1.5 mg/dL — ABNORMAL HIGH (ref 0.61–1.24)
GFR calc Af Amer: 51 mL/min — ABNORMAL LOW (ref >60)
GFR calc non Af Amer: 44 mL/min — ABNORMAL LOW (ref >60)
Glucose, Bld: 122 mg/dL — ABNORMAL HIGH (ref 65–99)
Potassium: 3.8 mmol/L (ref 3.5–5.1)
Sodium: 140 mmol/L (ref 135–145)

## 2015-03-13 LAB — BLOOD GAS, ARTERIAL
Acid-Base Excess: 0.3 mmol/L (ref 0.0–3.0)
Bicarbonate: 25.4 mEq/L (ref 21.0–28.0)
FIO2: 0.45 %
O2 SAT: 99.5 %
PATIENT TEMPERATURE: 37
PO2 ART: 169 mmHg — AB (ref 83.0–108.0)
pCO2 arterial: 42 mmHg (ref 32.0–48.0)
pH, Arterial: 7.39 (ref 7.350–7.450)

## 2015-03-13 LAB — GLUCOSE, CAPILLARY: Glucose-Capillary: 225 mg/dL — ABNORMAL HIGH (ref 65–99)

## 2015-03-13 LAB — CBC
HCT: 48.6 % (ref 40.0–52.0)
Hemoglobin: 16.6 g/dL (ref 13.0–18.0)
MCH: 32.6 pg (ref 26.0–34.0)
MCHC: 34.2 g/dL (ref 32.0–36.0)
MCV: 95.2 fL (ref 80.0–100.0)
Platelets: 131 10*3/uL — ABNORMAL LOW (ref 150–440)
RBC: 5.1 MIL/uL (ref 4.40–5.90)
RDW: 13.1 % (ref 11.5–14.5)
WBC: 7.2 10*3/uL (ref 3.8–10.6)

## 2015-03-13 LAB — MRSA PCR SCREENING: MRSA BY PCR: NEGATIVE

## 2015-03-13 LAB — TROPONIN I: Troponin I: <0.03 ng/mL (ref <0.031)

## 2015-03-13 MED ORDER — NYSTATIN 100000 UNIT/GM EX POWD
CUTANEOUS | Status: DC | PRN
  Filled 2015-03-13: qty 15

## 2015-03-13 MED ORDER — LEVOFLOXACIN IN D5W 750 MG/150ML IV SOLN
750.0000 mg | INTRAVENOUS | Status: DC
  Administered 2015-03-13: 750 mg via INTRAVENOUS

## 2015-03-13 MED ORDER — METHYLPREDNISOLONE SODIUM SUCC 125 MG IJ SOLR
60.0000 mg | Freq: Four times a day (QID) | INTRAMUSCULAR | Status: DC
  Administered 2015-03-13 – 2015-03-14 (×3): 60 mg via INTRAVENOUS
  Filled 2015-03-13 (×3): qty 2

## 2015-03-13 MED ORDER — LEVOFLOXACIN IN D5W 750 MG/150ML IV SOLN
INTRAVENOUS | Status: AC
  Administered 2015-03-13: 750 mg via INTRAVENOUS
  Filled 2015-03-13: qty 150

## 2015-03-13 MED ORDER — FLUOXETINE HCL 20 MG PO CAPS
20.0000 mg | ORAL_CAPSULE | Freq: Every day | ORAL | Status: DC
  Administered 2015-03-13 – 2015-03-15 (×3): 20 mg via ORAL
  Filled 2015-03-13 (×3): qty 1

## 2015-03-13 MED ORDER — DILTIAZEM HCL 30 MG PO TABS: 30.0000 mg | ORAL_TABLET | Freq: Four times a day (QID) | ORAL | Status: DC

## 2015-03-13 MED ORDER — PHENYTOIN SODIUM EXTENDED 100 MG PO CAPS
100.0000 mg | ORAL_CAPSULE | Freq: Every day | ORAL | Status: DC
  Administered 2015-03-13 – 2015-03-15 (×3): 100 mg via ORAL
  Filled 2015-03-13 (×3): qty 1

## 2015-03-13 MED ORDER — DARIFENACIN HYDROBROMIDE ER 7.5 MG PO TB24
7.5000 mg | ORAL_TABLET | Freq: Every day | ORAL | Status: DC
  Administered 2015-03-13 – 2015-03-15 (×3): 7.5 mg via ORAL
  Filled 2015-03-13 (×3): qty 1

## 2015-03-13 MED ORDER — DILTIAZEM HCL 30 MG PO TABS
30.0000 mg | ORAL_TABLET | Freq: Four times a day (QID) | ORAL | Status: DC
  Administered 2015-03-13 – 2015-03-15 (×8): 30 mg via ORAL
  Filled 2015-03-13 (×8): qty 1

## 2015-03-13 MED ORDER — ATORVASTATIN CALCIUM 20 MG PO TABS
20.0000 mg | ORAL_TABLET | Freq: Every day | ORAL | Status: DC
  Administered 2015-03-13 – 2015-03-14 (×2): 20 mg via ORAL
  Filled 2015-03-13 (×2): qty 1

## 2015-03-13 MED ORDER — LORAZEPAM 2 MG/ML IJ SOLN
1.0000 mg | Freq: Once | INTRAMUSCULAR | Status: AC
  Administered 2015-03-13: 1 mg via INTRAVENOUS

## 2015-03-13 MED ORDER — METHYLPREDNISOLONE SODIUM SUCC 125 MG IJ SOLR
INTRAMUSCULAR | Status: AC
  Administered 2015-03-13: 125 mg via INTRAVENOUS
  Filled 2015-03-13: qty 2

## 2015-03-13 MED ORDER — LEVOFLOXACIN IN D5W 750 MG/150ML IV SOLN
750.0000 mg | INTRAVENOUS | Status: DC
  Filled 2015-03-13: qty 150

## 2015-03-13 MED ORDER — ALBUTEROL (5 MG/ML) CONTINUOUS INHALATION SOLN
10.0000 mg/h | INHALATION_SOLUTION | RESPIRATORY_TRACT | Status: DC
  Filled 2015-03-13: qty 20

## 2015-03-13 MED ORDER — POLYETHYLENE GLYCOL 3350 17 G PO PACK
17.0000 g | PACK | Freq: Every day | ORAL | Status: DC
  Administered 2015-03-13 – 2015-03-15 (×3): 17 g via ORAL
  Filled 2015-03-13 (×3): qty 1

## 2015-03-13 MED ORDER — MELATONIN 5 MG PO TABS: ORAL_TABLET | Freq: Every day | ORAL | Status: DC

## 2015-03-13 MED ORDER — LEVALBUTEROL HCL 0.63 MG/3ML IN NEBU
0.6300 mg | INHALATION_SOLUTION | Freq: Four times a day (QID) | RESPIRATORY_TRACT | Status: DC
  Administered 2015-03-13 – 2015-03-15 (×7): 0.63 mg via RESPIRATORY_TRACT
  Filled 2015-03-13 (×8): qty 3

## 2015-03-13 MED ORDER — GABAPENTIN 100 MG PO CAPS
100.0000 mg | ORAL_CAPSULE | Freq: Every day | ORAL | Status: DC
  Administered 2015-03-13 – 2015-03-14 (×2): 100 mg via ORAL
  Filled 2015-03-13 (×2): qty 1

## 2015-03-13 MED ORDER — AMLODIPINE BESYLATE 5 MG PO TABS
5.0000 mg | ORAL_TABLET | Freq: Every day | ORAL | Status: DC
  Filled 2015-03-13: qty 1

## 2015-03-13 MED ORDER — LORAZEPAM 2 MG/ML IJ SOLN
INTRAMUSCULAR | Status: AC
  Administered 2015-03-13: 1 mg via INTRAVENOUS
  Filled 2015-03-13: qty 1

## 2015-03-13 MED ORDER — FINASTERIDE 5 MG PO TABS
5.0000 mg | ORAL_TABLET | Freq: Every day | ORAL | Status: DC
  Administered 2015-03-13 – 2015-03-15 (×3): 5 mg via ORAL
  Filled 2015-03-13 (×3): qty 1

## 2015-03-13 MED ORDER — HEPARIN SODIUM (PORCINE) 5000 UNIT/ML IJ SOLN
5000.0000 [IU] | Freq: Three times a day (TID) | INTRAMUSCULAR | Status: DC
  Administered 2015-03-13 – 2015-03-15 (×6): 5000 [IU] via SUBCUTANEOUS
  Filled 2015-03-13 (×6): qty 1

## 2015-03-13 MED ORDER — DOCUSATE SODIUM 100 MG PO CAPS
100.0000 mg | ORAL_CAPSULE | Freq: Two times a day (BID) | ORAL | Status: DC
  Administered 2015-03-13 – 2015-03-15 (×5): 100 mg via ORAL
  Filled 2015-03-13 (×5): qty 1

## 2015-03-13 MED ORDER — DILTIAZEM HCL 25 MG/5ML IV SOLN
10.0000 mg | Freq: Once | INTRAVENOUS | Status: AC
  Administered 2015-03-13: 10 mg via INTRAVENOUS

## 2015-03-13 MED ORDER — METHYLPREDNISOLONE SODIUM SUCC 125 MG IJ SOLR
125.0000 mg | Freq: Once | INTRAMUSCULAR | Status: AC
  Administered 2015-03-13: 125 mg via INTRAVENOUS

## 2015-03-13 MED ORDER — IPRATROPIUM-ALBUTEROL 0.5-2.5 (3) MG/3ML IN SOLN
RESPIRATORY_TRACT | Status: AC
Start: 2015-03-13 — End: 2015-03-13
  Filled 2015-03-13: qty 9

## 2015-03-13 MED ORDER — IPRATROPIUM-ALBUTEROL 0.5-2.5 (3) MG/3ML IN SOLN
3.0000 mL | Freq: Once | RESPIRATORY_TRACT | Status: AC
  Administered 2015-03-13: 3 mL via RESPIRATORY_TRACT

## 2015-03-13 MED ORDER — LISINOPRIL 20 MG PO TABS
20.0000 mg | ORAL_TABLET | Freq: Every day | ORAL | Status: DC
  Administered 2015-03-13 – 2015-03-15 (×3): 20 mg via ORAL
  Filled 2015-03-13 (×3): qty 1

## 2015-03-13 MED ORDER — SODIUM CHLORIDE 0.9 % IJ SOLN
3.0000 mL | Freq: Two times a day (BID) | INTRAMUSCULAR | Status: DC
  Administered 2015-03-13 – 2015-03-14 (×4): 3 mL via INTRAVENOUS

## 2015-03-13 MED ORDER — MICONAZOLE NITRATE 2 % EX POWD: CUTANEOUS | Status: DC | PRN

## 2015-03-13 MED ORDER — VITAMIN D3 25 MCG (1000 UNIT) PO TABS
2000.0000 [IU] | ORAL_TABLET | Freq: Every day | ORAL | Status: DC
  Administered 2015-03-13 – 2015-03-15 (×3): 2000 [IU] via ORAL
  Filled 2015-03-13 (×6): qty 2

## 2015-03-13 MED ORDER — DILTIAZEM HCL 25 MG/5ML IV SOLN
INTRAVENOUS | Status: AC
  Administered 2015-03-13: 10 mg via INTRAVENOUS
  Filled 2015-03-13: qty 5

## 2015-03-13 MED ORDER — TAMSULOSIN HCL 0.4 MG PO CAPS
0.4000 mg | ORAL_CAPSULE | Freq: Every day | ORAL | Status: DC
  Administered 2015-03-13 – 2015-03-15 (×3): 0.4 mg via ORAL
  Filled 2015-03-13 (×3): qty 1

## 2015-03-13 MED ORDER — ASPIRIN EC 325 MG PO TBEC
325.0000 mg | DELAYED_RELEASE_TABLET | Freq: Every day | ORAL | Status: DC
  Administered 2015-03-13 – 2015-03-15 (×3): 325 mg via ORAL
  Filled 2015-03-13 (×3): qty 1

## 2015-03-13 MED ORDER — ALBUTEROL SULFATE (2.5 MG/3ML) 0.083% IN NEBU
INHALATION_SOLUTION | RESPIRATORY_TRACT | Status: AC
  Administered 2015-03-13: 10 mg
  Filled 2015-03-13: qty 12

## 2015-03-13 MED ORDER — ACETAMINOPHEN 325 MG PO TABS
650.0000 mg | ORAL_TABLET | ORAL | Status: DC | PRN
  Administered 2015-03-13: 650 mg via ORAL
  Filled 2015-03-13: qty 2

## 2015-03-13 MED ORDER — FUROSEMIDE 10 MG/ML IJ SOLN
40.0000 mg | Freq: Once | INTRAMUSCULAR | Status: AC
Start: 2015-03-13 — End: 2015-03-13
  Administered 2015-03-13: 40 mg via INTRAVENOUS
  Filled 2015-03-13: qty 4

## 2015-03-13 MED ORDER — PHENYTOIN 50 MG PO CHEW
50.0000 mg | CHEWABLE_TABLET | Freq: Once | ORAL | Status: AC
  Administered 2015-03-13: 50 mg via ORAL
  Filled 2015-03-13 (×2): qty 1

## 2015-03-13 NOTE — H&P (Signed)
Ian Townsend is an 74 y.o. male.    Chief Complaint: Difficulty breathing HPI: The patient presents to the hospital via his son's vehicle after complaining of shortness of breath and difficulty breathing at his skilled nursing facility. The patient was able to tell his son that he felt bad starting 2 days ago. Car the patient was oriented, per his son. Here in the emergency department the patient is able to talk but in incomplete sentences due to tachypnea and shortness of breath. He was found to have very little air movement emergency department which prompted respiratory therapy to place BiPAP on him. He denies chest pain, palpitations, nausea, vomiting or diaphoresis. The patient received multiple breathing treatments as well as Solu-Medrol. He was also found to have atrial fibrillation with rapid ventricular rate which prompted emergency department to call for admission.  Past Medical History  Diagnosis Date  . Hyperlipidemia   . Esophageal reflux   . Chronic airway obstruction, not elsewhere classified   . Seasonal allergies   . COPD (chronic obstructive pulmonary disease)   . History of chronic obstructive pulmonary disease   . Benign prostatic hypertrophy   . TIA (transient ischemic attack)   . Unresponsiveness 02/08/2012    patient was sent to [facility]  . Syncope   . Arrhythmia   . Paroxysmal a-fib   . Chronic kidney disease     stage III  . PVD (peripheral vascular disease)   . Broken arm     left  . Hypertension   . Stroke     x2, last approx 2013  . Seizures     at time of last stroke  . Neglect of one side of body     Left  . 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 CHF (congestive heart failure)     in past    Past Surgical History  Procedure Laterality Date  . Colonoscopy  2008    polyps  . Upper gastrointestinal endoscopy  2008  . Hernia repair Bilateral 07-28-2013    inguinal  . Fracture surgery      ankle  . Esophagogastroduodenoscopy N/A 02/21/2015     Procedure: ESOPHAGOGASTRODUODENOSCOPY (EGD);  Surgeon: Lucilla Lame, MD;  Location: [facility];  Service: Gastroenterology;  Laterality: N/A;    Family History  Problem Relation Age of Onset  . Diabetes Mellitus II Mother    Social History:  reports that he has been smoking Cigars.  He does not have any smokeless tobacco history on file. He reports that he does not drink alcohol or use illicit drugs.  Allergies:  Allergies  Allergen Reactions  . Penicillins Hives          Prior to Admission medications   Medication Sig Start Date End Date Taking? Authorizing Provider  acetaminophen (TYLENOL) 325 MG tablet Take 650 mg by mouth every 4 (four) hours as needed for pain or fever.    Yes Historical Provider, MD  amLODipine (NORVASC) 5 MG tablet Take 5 mg by mouth daily. 9 AM   Yes Historical Provider, MD  aspirin 325 MG tablet Take 1 tablet (325 mg total) by mouth daily. 9 AM 02/28/15  Yes Lucilla Lame, MD  atorvastatin (LIPITOR) 20 MG tablet Take 20 mg by mouth daily. 9 PM   Yes Historical Provider, MD  Cholecalciferol (VITAMIN D-3) 1000 UNITS CAPS Take 2 capsules by mouth daily. 9 AM   Yes Historical Provider, MD  finasteride (PROSCAR) 5 MG tablet  Take 5 mg by mouth daily. 9 am   Yes Historical Provider, MD  FLUoxetine (PROZAC) 20 MG tablet Take 20 mg by mouth daily. 9 AM   Yes Historical Provider, MD  gabapentin (NEURONTIN) 100 MG capsule Take 1 capsule by mouth at bedtime. 9 AM 06/12/13  Yes Historical Provider, MD  lisinopril (PRINIVIL,ZESTRIL) 20 MG tablet Take 1 tablet (20 mg total) by mouth daily. 05/14/14  Yes Wellington Hampshire, MD  Melatonin 5 MG TABS Take by mouth daily. 8 PM   Yes Historical Provider, MD  phenytoin (DILANTIN) 100 MG ER capsule Take 100 mg by mouth daily. 9 AM   Yes Historical Provider, MD  PHENYTOIN INFATABS 50 MG tablet 50 mg once. 9 AM 06/12/13  Yes Historical Provider, MD  polyethylene glycol (MIRALAX / GLYCOLAX) packet Take 17 g by mouth daily. 9 AM   Yes  Historical Provider, MD  solifenacin (VESICARE) 10 MG tablet Take 10 mg by mouth daily. 9 AM   Yes Historical Provider, MD  tamsulosin (FLOMAX) 0.4 MG CAPS Take 0.4 mg by mouth daily. 9 AM   Yes Historical Provider, MD  miconazole (LOTRIMIN AF) 2 % powder Apply topically as needed for itching.    Historical Provider, MD     Results for orders placed or performed during the hospital encounter of 03/13/15 (from the past 48 hour(s))  CBC     Status: Abnormal   Collection Time: 03/13/15  5:08 AM  Result Value Ref Range   WBC 7.2 3.8 - 10.6 K/uL   RBC 5.10 4.40 - 5.90 MIL/uL   Hemoglobin 16.6 13.0 - 18.0 g/dL   HCT 48.6 40.0 - 52.0 %   MCV 95.2 80.0 - 100.0 fL   MCH 32.6 26.0 - 34.0 pg   MCHC 34.2 32.0 - 36.0 g/dL   RDW 13.1 11.5 - 14.5 %   Platelets 131 (L) 150 - 440 K/uL  Basic metabolic panel     Status: Abnormal   Collection Time: 03/13/15  5:08 AM  Result Value Ref Range   Sodium 140 135 - 145 mmol/L   Potassium 3.8 3.5 - 5.1 mmol/L   Chloride 104 101 - 111 mmol/L   CO2 28 22 - 32 mmol/L   Glucose, Bld 122 (H) 65 - 99 mg/dL   BUN 22 (H) 6 - 20 mg/dL   Creatinine, Ser 1.50 (H) 0.61 - 1.24 mg/dL   Calcium 9.2 8.9 - 10.3 mg/dL   GFR calc non Af Amer 44 (L) >60 mL/min   GFR calc Af Amer 51 (L) >60 mL/min    Comment: (NOTE) The eGFR has been calculated using the CKD EPI equation. This calculation has not been validated in all clinical situations. eGFR's persistently <60 mL/min signify possible Chronic Kidney Disease.    Anion gap 8 5 - 15  Troponin I     Status: None   Collection Time: 03/13/15  5:08 AM  Result Value Ref Range   Troponin I <0.03 <0.031 ng/mL    Comment:        NO INDICATION OF MYOCARDIAL INJURY.   Blood gas, arterial     Status: Abnormal (Preliminary result)   Collection Time: 03/13/15  5:10 AM  Result Value Ref Range   FIO2 0.45 %   Delivery systems NASAL CANNULA    pH, Arterial 7.39 7.350 - 7.450   pCO2 arterial 42 32.0 - 48.0 mmHg   pO2, Arterial  169 (H) 83.0 - 108.0 mmHg   Bicarbonate 25.4 21.0 -  28.0 mEq/L   Acid-Base Excess 0.3 0.0 - 3.0 mmol/L   O2 Saturation 99.5 %   Patient temperature 37.0    Collection site RIGHT RADIAL    Sample type ARTERIAL DRAW    Allens test (pass/fail) PASS PASS   Mechanical Rate PENDING    Dg Chest Port 1 View  03/13/2015   CLINICAL DATA:  Respiratory distress  EXAM: PORTABLE CHEST - 1 VIEW  COMPARISON:  08/05/2012  FINDINGS: Normal heart size. There is widening of the lower mediastinum which is chronic. A small associated lucency is compatible with hiatal hernia. Mild hyperinflation. There is no edema, consolidation, effusion, or pneumothorax. There is tracheal deviation to the left which is chronic and from thyroid nodule seen by CT 09/12/2012  IMPRESSION: 1. No active disease. 2. Moderate hiatal hernia. 3. 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   Electronically Signed   By: Monte Fantasia M.D.   On: 03/13/2015 05:45    Review of Systems  Constitutional: Negative for fever and chills.  HENT: Negative for sore throat and tinnitus.   Eyes: Negative for blurred vision and redness.  Respiratory: Positive for shortness of breath. Negative for cough.   Cardiovascular: Negative for chest pain, palpitations, orthopnea and PND.  Gastrointestinal: Negative for nausea, vomiting, abdominal pain and diarrhea.  Genitourinary: Negative for dysuria, urgency and frequency.  Musculoskeletal: Negative for myalgias and joint pain.  Skin: Negative for rash.       No lesions  Neurological: Positive for weakness. Negative for speech change and focal weakness.  Endo/Heme/Allergies: Does not bruise/bleed easily.       No temperature intolerance  Psychiatric/Behavioral: Negative for depression and suicidal ideas.    Blood pressure 146/107, pulse 147, temperature 97.4 F (36.3 C), temperature source Axillary, resp. rate 25, height _0  (1.676 m), weight 59.875 kg (132 lb), SpO2 98 %. Physical Exam   Nursing note and vitals reviewed. Constitutional: He is oriented to person, place, and time. He appears well-developed and well-nourished.  HENT:  Head: Normocephalic and atraumatic.  Mouth/Throat: Oropharynx is clear and moist.  Eyes: Conjunctivae and EOM are normal. Pupils are equal, round, and reactive to light. No scleral icterus.  Neck: Normal range of motion. Neck supple. No JVD present. No tracheal deviation present. No thyromegaly present.  Cardiovascular: Normal heart sounds and intact distal pulses.  An irregularly irregular rhythm present. Tachycardia present.  Exam reveals no gallop and no friction rub.   No murmur heard. Respiratory: He is in respiratory distress. He has decreased breath sounds. He has wheezes.  Decreased BS b/l  GI: Soft. Bowel sounds are normal. He exhibits no distension. There is no tenderness.  Genitourinary:  deferred  Musculoskeletal: Normal range of motion. He exhibits no edema.  Lymphadenopathy:    He has no cervical adenopathy.  Neurological: He is oriented to person, place, and time. No cranial nerve deficit.  somnolent  Skin: Skin is warm and dry. No rash noted. No erythema.  Psychiatric: He has a normal mood and affect. His behavior is normal. Judgment and thought content normal.     Assessment/Plan This is a 74 year old male admitted for acute on chronic respiratory failure with hypoxemia. 1. Acute on chronic respiratory failure: Likely secondary to COPD. The patient is mildly hypoxic and requires 28% FiO2.  I've place patient on Levaquin for anti-inflammatory response. He has no infiltrate seen on chest x-ray. He is responding well to BiPAP and able to answer questions albeit with some difficulty. I will start  the patient on an inhaled corticosteroid as well as Spiriva. Continue to taper oral steroids. 2. Atrial fibrillation with rapid ventricular response: Initially the patient's heart rate was 140-160. I have given him a total of 20 mg of  diltiazem IV. Her rate has improved to 110 to 130s but his pressure has dropped dramatically. At this time I will not place the patient on antiarrhythmic drip due to his history of cerebral infarcts that I require increased perfusion pressure to prevent further events. [facility] consult cardiology for possible amiodarone. Check TSH 3. Hypertension: Continue amlodipine and lisinopril 4. Seizure disorder: The patient has not had a seizure in more than a year and has not spread any seizure activity with this admission. Continue Dilantin. 5. Benign prostatic hypertrophy: Continue tamsulosin and Vesicare 6. DVT prophylaxis: heparin 7. GI prophylaxis: None The patient is a full code.  Time spent on admission orders and patient care approximately 45 minutes Harrie Foreman 03/13/2015, 7:58 AM

## 2015-03-13 NOTE — ED Notes (Signed)
Patient placed on BiPap. Tolerating well.

## 2015-03-13 NOTE — ED Provider Notes (Addendum)
[facility] Emergency Department Provider Note  ____________________________________________  Time seen: 5:00 AM  I have reviewed the triage vital signs and the nursing notes.   HISTORY  Chief Complaint Respiratory Distress      HPI Ian Townsend is a 74 y.o. male presents with Ian Townsend distress from [place]. Patient admits to progressive dyspnea times one day positive wheezing patient denies any fever denies chest pain.     Past Medical History  Diagnosis Date  . Hyperlipidemia   . Esophageal reflux   . Chronic airway obstruction, not elsewhere classified   . Seasonal allergies   . COPD (chronic obstructive pulmonary disease)   . History of chronic obstructive pulmonary disease   . Benign prostatic hypertrophy   . TIA (transient ischemic attack)   . Unresponsiveness 02/08/2012    patient was sent to [facility]  . Syncope   . Arrhythmia   . Paroxysmal a-fib   . Chronic kidney disease     stage III  . PVD (peripheral vascular disease)   . Broken arm     left  . Hypertension   . Stroke     x2, last approx 2013  . Seizures     at time of last stroke  . Neglect of one side of body     Left  . 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 CHF (congestive heart failure)     in past    Patient Active Problem List   Diagnosis Date Noted  . Hypertension   . Preop cardiovascular exam 10/21/2012  . A-fib 03/29/2012  . SSS (sick sinus syndrome) 03/29/2012  . Syncope 03/29/2012  . Dizziness 03/29/2012  . Bilateral inguinal hernia 06/11/2011    Past Surgical History  Procedure Laterality Date  . Colonoscopy  2008    polyps  . Upper gastrointestinal endoscopy  2008  . Hernia repair Bilateral 07-28-2013    inguinal  . Fracture surgery      ankle  . Esophagogastroduodenoscopy N/A 02/21/2015    Procedure: ESOPHAGOGASTRODUODENOSCOPY (EGD);  Surgeon: Lucilla Lame, MD;  Location: [facility];  Service: Gastroenterology;  Laterality:  N/A;    Current Outpatient Rx  Name  Route  Sig  Dispense  Refill  . acetaminophen (TYLENOL) 325 MG tablet   Oral   Take 650 mg by mouth every 4 (four) hours as needed for pain or fever.          Marland Kitchen amLODipine (NORVASC) 5 MG tablet   Oral   Take 5 mg by mouth daily. 9 AM         . aspirin 325 MG tablet   Oral   Take 1 tablet (325 mg total) by mouth daily. 9 AM   11 tablet   0   . atorvastatin (LIPITOR) 20 MG tablet   Oral   Take 20 mg by mouth daily. 9 PM         . Cholecalciferol (VITAMIN D-3) 1000 UNITS CAPS   Oral   Take 2 capsules by mouth daily. 9 AM         . finasteride (PROSCAR) 5 MG tablet   Oral   Take 5 mg by mouth daily. 9 am         . FLUoxetine (PROZAC) 20 MG tablet   Oral   Take 20 mg by mouth daily. 9 AM         . gabapentin (NEURONTIN) 100 MG capsule   Oral  Take 1 capsule by mouth at bedtime. 9 AM         . lisinopril (PRINIVIL,ZESTRIL) 20 MG tablet   Oral   Take 1 tablet (20 mg total) by mouth daily. Patient taking differently: Take 20 mg by mouth daily. 9 AM   90 tablet   3   . Melatonin 5 MG TABS   Oral   Take by mouth daily. 8 PM         . miconazole (LOTRIMIN AF) 2 % powder   Topical   Apply topically as needed for itching.         . phenytoin (DILANTIN) 100 MG ER capsule   Oral   Take 100 mg by mouth daily. 9 AM         . PHENYTOIN INFATABS 50 MG tablet      50 mg once. 9 AM         . polyethylene glycol (MIRALAX / GLYCOLAX) packet   Oral   Take 17 g by mouth daily. 9 AM         . solifenacin (VESICARE) 10 MG tablet   Oral   Take 10 mg by mouth daily. 9 AM         . tamsulosin (FLOMAX) 0.4 MG CAPS   Oral   Take 0.4 mg by mouth daily. 9 AM           Allergies Penicillins  History reviewed. No pertinent family history.  Social History History  Substance Use Topics  . Smoking status: Current Every Day Smoker -- 0.25 packs/day for 50 years    Types: Cigars    Last Attempt to Quit:  08/30/2010  . Smokeless tobacco: Not on file  . Alcohol Use: No    Review of Systems  Constitutional: Negative for fever. Eyes: Negative for visual changes. ENT: Negative for sore throat. Cardiovascular: Negative for chest pain. Respiratory: Negative for shortness of breath. Gastrointestinal: Negative for abdominal pain, vomiting and diarrhea. Genitourinary: Negative for dysuria. Musculoskeletal: Negative for back pain. Skin: Negative for rash. Neurological: Negative for headaches, focal weakness or numbness.   10-point ROS otherwise negative.  ____________________________________________   PHYSICAL EXAM:  VITAL SIGNS: ED Triage Vitals  Enc Vitals Group     BP 03/13/15 0500 204/147 mmHg     Pulse Rate 03/13/15 0500 113     Resp 03/13/15 0500 34     Temp 03/13/15 0500 97.4 F (36.3 C)     Temp Source 03/13/15 0500 Axillary     SpO2 03/13/15 0500 100 %     Weight 03/13/15 0500 132 lb (59.875 kg)     Height 03/13/15 0500 5\' 6"  (1.676 m)     Head Cir --      Peak Flow --      Pain Score 03/13/15 0504 0     Pain Loc --      Pain Edu? --      Excl. in [place]? --      Constitutional: Alert and oriented. Severe rest or distress Eyes: Conjunctivae are normal. PERRL. Normal extraocular movements. ENT   Head: Normocephalic and atraumatic.   Nose: No congestion/rhinnorhea.   Mouth/Throat: Mucous membranes are moist.   Neck: No stridor. Hematological/Lymphatic/Immunilogical: No cervical lymphadenopathy. Cardiovascular: Normal rate, regular rhythm. Normal and symmetric distal pulses are present in all extremities. No murmurs, rubs, or gallops. Respiratory: Diffuse rhonchi bilaterally, positive accessory muscle use, tachypnea Gastrointestinal: Soft and nontender. No distention. There is no CVA tenderness. Genitourinary: deferred  Musculoskeletal: Nontender with normal range of motion in all extremities. No joint effusions.  No lower extremity tenderness nor  edema. Neurologic:  Normal speech and language. No gross focal neurologic deficits are appreciated. Speech is normal.  Skin:  Skin is warm, dry and intact. No rash noted. Psychiatric: Mood and affect are normal. Speech and behavior are normal. Patient exhibits appropriate insight and judgment.  ____________________________________________    LABS (pertinent positives/negatives)  Labs Reviewed  CBC - Abnormal; Notable for the following:    Platelets 131 (*)    All other components within normal limits  BASIC METABOLIC PANEL - Abnormal; Notable for the following:    Glucose, Bld 122 (*)    BUN 22 (*)    Creatinine, Ser 1.50 (*)    GFR calc non Af Amer 44 (*)    GFR calc Af Amer 51 (*)    All other components within normal limits  BLOOD GAS, ARTERIAL - Abnormal; Notable for the following:    pO2, Arterial 169 (*)    All other components within normal limits  TROPONIN I     Date: 03/13/2015  Rate: 136  Rhythm: Atrial fibrillation with RVR  QRS Axis: normal  Intervals: normal  ST/T Wave abnormalities: normal  Conduction Disutrbances: none  Narrative Interpretation: unremarkable     ____________________________________________    RADIOLOGY  Chest x-ray consistent with COPD  ____________________________________________    Critical Care performed: CRITICAL CARE Performed by: Marjean Donna N   Total critical care time: 90  Critical care time was exclusive of separately billable procedures and treating other patients.  Critical care was necessary to treat or prevent imminent or life-threatening deterioration.  Critical care was time spent personally by me on the following activities: development of treatment plan with patient and/or surrogate as well as nursing, discussions with consultants, evaluation of patient's response to treatment, examination of patient, obtaining history from patient or surrogate, ordering and performing treatments and interventions,  ordering and review of laboratory studies, ordering and review of radiographic studies, pulse oximetry and re-evaluation of patient's condition.   ____________________________________________   INITIAL IMPRESSION / ASSESSMENT AND PLAN / ED COURSE  Pertinent labs & imaging results that were available during my care of the patient were reviewed by me and considered in my medical decision making (see chart for details).  History of physical exam consistent with COPD exacerbation patient tachypnea tachycardic positive accessory muscle use and hypoxic severe rest or distress as such DuoNeb 3 given followed by continuous albuterol however patient continued to have beforementioned symptoms. As such BiPAP was applied. Patient will be admitted to the hospital for further treatments  ____________________________________________   FINAL CLINICAL IMPRESSION(S) / ED DIAGNOSES  Final diagnoses:  COPD with acute exacerbation      Gregor Hams, MD 03/13/15 [place], MD 03/13/15 [phone]

## 2015-03-13 NOTE — Care Management (Signed)
It is reported that patient presents from [facility] with shortness of breath.  Found to be in a fib with rvr requiring cardizem drip and admission to ICU.  If patient is able to return tpo the assisted living facility, may need to consider nursing home health.  Unsure at present of current functional status

## 2015-03-13 NOTE — ED Notes (Signed)
Dr. Marcille Blanco at bedside to evaluate patient.

## 2015-03-13 NOTE — Progress Notes (Signed)
[facility] NAME: Colbi Staubs    MR#:  [id number]  DATE OF BIRTH:  28-Jan-1941  SUBJECTIVE:   Shortness of breath greatly improved  REVIEW OF SYSTEMS:   Review of Systems  Constitutional: Negative for fever.  Respiratory: Positive for cough, shortness of breath and wheezing. Negative for hemoptysis and sputum production.   Cardiovascular: Negative for chest pain and palpitations.  Gastrointestinal: Negative for nausea, vomiting and abdominal pain.  Genitourinary: Negative for dysuria.    DRUG ALLERGIES:   Allergies  Allergen Reactions  . Penicillins Hives         VITALS:  Blood pressure 139/74, pulse 63, temperature 98.5 F (36.9 C), temperature source Oral, resp. rate 28, height 5\' 6"  (1.676 m), weight 59.875 kg (132 lb), SpO2 97 %.  PHYSICAL EXAMINATION:  GENERAL:  74 y.o.-year-old patient lying in the bed with no acute distress. Thin, rapid respirations EYES: Pupils equal, round, reactive to light and accommodation. No scleral icterus. Extraocular muscles intact.  HEENT: Head atraumatic, normocephalic. Oropharynx and nasopharynx clear.  NECK:  Supple, no jugular venous distention. No thyroid enlargement, no tenderness.  LUNGS: Scattered wheezes and rhonchi, rapid respirations, fair air movement CARDIOVASCULAR: Systolic ejection murmur, S1, S2 normal. No rubs, or gallops.  ABDOMEN: Soft, nontender, nondistended. Bowel sounds present. No organomegaly or mass.  EXTREMITIES: No pedal edema, cyanosis, or clubbing.  NEUROLOGIC: Cranial nerves II through XII are intact. Muscle strength 5/5 in all extremities. Sensation intact. Gait not checked. Left sided neglect  PSYCHIATRIC: The patient is alert and oriented x 3.  SKIN: No obvious rash, lesion, or ulcer.    LABORATORY PANEL:   CBC  Recent Labs Lab 03/13/15 0508  WBC 7.2  HGB 16.6  HCT 48.6  PLT 131*    ------------------------------------------------------------------------------------------------------------------  Chemistries   Recent Labs Lab 03/13/15 0508  NA 140  K 3.8  CL 104  CO2 28  GLUCOSE 122*  BUN 22*  CREATININE 1.50*  CALCIUM 9.2   ------------------------------------------------------------------------------------------------------------------  Cardiac Enzymes  Recent Labs Lab 03/13/15 0508  TROPONINI <0.03   ------------------------------------------------------------------------------------------------------------------  RADIOLOGY:  Dg Chest Port 1 View  03/13/2015   CLINICAL DATA:  Respiratory distress  EXAM: PORTABLE CHEST - 1 VIEW  COMPARISON:  08/05/2012  FINDINGS: Normal heart size. There is widening of the lower mediastinum which is chronic. A small associated lucency is compatible with hiatal hernia. Mild hyperinflation. There is no edema, consolidation, effusion, or pneumothorax. There is tracheal deviation to the left which is chronic and from thyroid nodule seen by CT 09/12/2012  IMPRESSION: 1. No active disease. 2. Moderate hiatal hernia. 3. 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   Electronically Signed   By: Monte Fantasia M.D.   On: 03/13/2015 05:45    EKG:   Orders placed or performed during the hospital encounter of 03/13/15  . EKG test  . EKG test    ASSESSMENT AND PLAN:   This is a 74 year old male admitted for acute on chronic respiratory failure with hypoxemia.  1. Acute respiratory failure due to COPD exacerbation - Initially on BiPAP, transitioned to nasal cannula and is currently on room air  2. COPD exacerbation: - Continue with Solu-Medrol, nebulizers, Levaquin  3. Atrial fibrillation with rapid ventricular response: - Appreciate cardiology dilatation - Likely chronic atrial fibrillation - Echo pending - Question regarding anticoagulation, the past he has not been a candidate due to fall risk, currently  he is in skilled nursing  and is not mobile so may be a better candidate at this time  4. Hypertension: Continue amlodipine and lisinopril  5. Seizure disorder: The patient has not had a seizure in more than a year and has not spread any seizure activity with this admission. Continue Dilantin. Check level  6. Benign prostatic hypertrophy: Continue tamsulosin and Vesicare  7. DVT prophylaxis: heparin  8. GI prophylaxis: None  Okay for transfer to telemetry today   All the records are reviewed and case discussed with Care Management/Social Workerr. Management plans discussed with the patient, family and they are in agreement.  CODE STATUS: Full  TOTAL TIME TAKING CARE OF THIS PATIENT: 35 minutes.   POSSIBLE D/C IN 2 DAYS, DEPENDING ON CLINICAL CONDITION.   Myrtis Ser M.D on 03/13/2015 at 4:13 PM  Between 7am to 6pm - Pager - [phone]  After 6pm go to [email or web address] - password [email or web address]  [facility]  Office  [phone]  CC: Primary care physician; [place] Pink, MD

## 2015-03-13 NOTE — ED Notes (Signed)
Admitting MD made aware of pt HR.

## 2015-03-13 NOTE — Progress Notes (Signed)
Patient is alert, reporting no pain. From ER this morning on 28% BIPAP, converted to room air at this time. Patient reports no SOB. Exp wheezing, solumedrol and resp treatment today. Oxygen saturation WNL. PO cardizem with HR 80-90s at rest. To be transferred to 2A, report called to Adriene. Will transfer shortly.

## 2015-03-13 NOTE — ED Notes (Signed)
Patient brought by son from [facility] for respiratory distress. Patient states has been having difficulty breathing since yesterday. Patient breathing even and labored. Patient appears to be working hard to breath, using accessory muscles to breath, expiratory wheezing heard. Patient reports history of HTN has been taking medicine.

## 2015-03-13 NOTE — Progress Notes (Signed)
*  PRELIMINARY RESULTS* Echocardiogram 2D Echocardiogram has been performed.  Ian Townsend 03/13/2015, 1:01 PM

## 2015-03-13 NOTE — Progress Notes (Signed)
ANTIBIOTIC CONSULT NOTE - INITIAL  Pharmacy Consult for levaquin Indication: respiratory failure  Allergies  Allergen Reactions  . Penicillins Hives         Patient Measurements: Height: 5\' 6"  (167.6 cm) Weight: 132 lb (59.875 kg) IBW/kg (Calculated) : 63.8 Adjusted Body Weight:   Vital Signs: Temp: 97.4 F (36.3 C) (05/25 0500) Temp Source: Axillary (05/25 0500) BP: 176/102 mmHg (05/25 0533) Pulse Rate: 143 (05/25 0533) Intake/Output from previous day:   Intake/Output from this shift:    Labs:  Recent Labs  03/13/15 0508  WBC 7.2  HGB 16.6  PLT 131*  CREATININE 1.50*   Estimated Creatinine Clearance: 36.6 mL/min (by C-G formula based on Cr of 1.5). No results for input(s): VANCOTROUGH, VANCOPEAK, VANCORANDOM, GENTTROUGH, GENTPEAK, GENTRANDOM, TOBRATROUGH, TOBRAPEAK, TOBRARND, AMIKACINPEAK, AMIKACINTROU, AMIKACIN in the last 72 hours.   Microbiology: No results found for this or any previous visit (from the past 720 hour(s)).  Medical History: Past Medical History  Diagnosis Date  . Hyperlipidemia   . Esophageal reflux   . Chronic airway obstruction, not elsewhere classified   . Seasonal allergies   . COPD (chronic obstructive pulmonary disease)   . History of chronic obstructive pulmonary disease   . Benign prostatic hypertrophy   . TIA (transient ischemic attack)   . Unresponsiveness 02/08/2012    patient was [facility] to [facility]  . Syncope   . Arrhythmia   . Paroxysmal a-fib   . Chronic kidney disease     stage III  . PVD (peripheral vascular disease)   . Broken arm     left  . Hypertension   . Stroke     x2, last approx 2013  . Seizures     at time of last stroke  . Neglect of one side of body     Left  . 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 CHF (congestive heart failure)     in past    Medications:  Scheduled:   Anti-infectives    Start     Dose/Rate Route Frequency Ordered Stop   03/13/15 0630  levofloxacin (LEVAQUIN) IVPB 750  mg     750 mg 100 mL/hr over 90 Minutes Intravenous Every 48 hours 03/13/15 0615       Assessment: Pharmacy consulted to adjust levaquin in this 74yo M being treated empirically for respiratory failure. Est CrCL: 8ml/min  Goal of Therapy:  Resolution of infection  Plan:  Levaquin 750mg  IV Q48hrs ordered.  Expected duration 7 days with resolution of temperature and/or normalization of WBC Follow up culture results  Theone Bowell K 03/13/2015,6:15 AM

## 2015-03-13 NOTE — Progress Notes (Signed)
Pt. received from [facility] with son at bedside, pt alert with some confusion. Pt high fall risk per son bed alarm placed. Pt. Denies pain or discomfort.

## 2015-03-13 NOTE — ED Notes (Signed)
Patient getting continuous albuterol breathing treatment, patient tolerating treatment. Respirations even, redirecting patient to take slow deep breaths. Respiratory therapy in room to place patient on BiPap. RT discussing use of BiPap with patient.
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
## 2015-03-14 DIAGNOSIS — I482 Chronic atrial fibrillation: Secondary | ICD-10-CM

## 2015-03-14 LAB — BASIC METABOLIC PANEL
ANION GAP: 10 (ref 5–15)
BUN: 27 mg/dL — AB (ref 6–20)
CO2: 28 mmol/L (ref 22–32)
CREATININE: 1.46 mg/dL — AB (ref 0.61–1.24)
Calcium: 9.1 mg/dL (ref 8.9–10.3)
Chloride: 102 mmol/L (ref 101–111)
GFR calc non Af Amer: 46 mL/min — ABNORMAL LOW (ref >60)
GFR, EST AFRICAN AMERICAN: 53 mL/min — AB (ref >60)
Glucose, Bld: 177 mg/dL — ABNORMAL HIGH (ref 65–99)
Potassium: 3.4 mmol/L — ABNORMAL LOW (ref 3.5–5.1)
SODIUM: 140 mmol/L (ref 135–145)

## 2015-03-14 LAB — PHENYTOIN LEVEL, TOTAL: PHENYTOIN LVL: 3.3 ug/mL — AB (ref 10.0–20.0)

## 2015-03-14 LAB — CBC
HEMATOCRIT: 43.5 % (ref 40.0–52.0)
HEMOGLOBIN: 15.3 g/dL (ref 13.0–18.0)
MCH: 33.3 pg (ref 26.0–34.0)
MCHC: 35.1 g/dL (ref 32.0–36.0)
MCV: 94.7 fL (ref 80.0–100.0)
PLATELETS: 122 10*3/uL — AB (ref 150–440)
RBC: 4.6 MIL/uL (ref 4.40–5.90)
RDW: 13.2 % (ref 11.5–14.5)
WBC: 7.8 10*3/uL (ref 3.8–10.6)

## 2015-03-14 LAB — PHOSPHORUS: Phosphorus: 2.7 mg/dL (ref 2.5–4.6)

## 2015-03-14 LAB — MAGNESIUM: Magnesium: 1.9 mg/dL (ref 1.7–2.4)

## 2015-03-14 MED ORDER — MOMETASONE FURO-FORMOTEROL FUM 200-5 MCG/ACT IN AERO
2.0000 | INHALATION_SPRAY | Freq: Two times a day (BID) | RESPIRATORY_TRACT | Status: DC
  Administered 2015-03-14 – 2015-03-15 (×3): 2 via RESPIRATORY_TRACT
  Filled 2015-03-14: qty 8.8

## 2015-03-14 MED ORDER — TIOTROPIUM BROMIDE MONOHYDRATE 18 MCG IN CAPS
18.0000 ug | ORAL_CAPSULE | Freq: Every day | RESPIRATORY_TRACT | Status: DC
  Administered 2015-03-14 – 2015-03-15 (×2): 18 ug via RESPIRATORY_TRACT
  Filled 2015-03-14: qty 5

## 2015-03-14 MED ORDER — METHYLPREDNISOLONE SODIUM SUCC 125 MG IJ SOLR
60.0000 mg | Freq: Two times a day (BID) | INTRAMUSCULAR | Status: DC
  Administered 2015-03-14 – 2015-03-15 (×2): 60 mg via INTRAVENOUS
  Filled 2015-03-14 (×2): qty 2

## 2015-03-14 NOTE — Care Management (Signed)
CM observed patient ambulating with rolling walker (patient has his own) with nursing.  His sats remained 93% on room air with activity.  CM would anticipate patient could return to the assisted living with home health nursing.  Daughter [name] does not have an agency preference

## 2015-03-14 NOTE — Clinical Social Work Note (Signed)
Clinical Social Work Assessment  Patient Details  Name: Ian Townsend MRN: [id number] Date of Birth: October 01, 1941  Date of referral:  03/14/15               Reason for consult:  Discharge Planning                Permission sought to share information with:  Facility Sport and exercise psychologist, Family Supports Permission granted to share information::  Yes, Verbal Permission Granted  Name::     Lynette/daughter Arzell Jr/son  Agency::  [employer]  Relationship::  family, Designer, industrial/product Information:     Housing/Transportation Living arrangements for the past 2 months:  [place] of Information:  Patient Patient Interpreter Needed:  None Criminal Activity/Legal Involvement Pertinent to Current Situation/Hospitalization:  No - Comment as needed Significant Relationships:  Adult Children Lives with:  Facility Resident Do you feel safe going back to the place where you live?    Need for family participation in patient care:  Yes (Comment)  Care giving concerns: No caregiving concerns verbalized by Pt or evident by care.    Social Worker assessment / plan:  CSW met with Pt who reports that he is retired, divorced twice, and has 2 adult children who are involved in his care. Pt states that his daughter [name] is his POA should he need one. No family at bedside. 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 He now lives at [facility] where he uses a walker to move about the community. Pt states that he has friends at the facility. Pt shares with CSW that he is moving around the hospital about as well as he does at the ALF. Pt shares that he does not work with PT at the facility.  Employment status:  Retired Forensic scientist:  Medicare PT Recommendations:  Not assessed at this time Information / Referral to community resources:     Patient/Family's Response to care: Pt states that he is doing fine. He is engaged with the  nurses and understands his treatment.   Patient/Family's Understanding of and Emotional Response to Diagnosis, Current Treatment, and Prognosis:  Pt is calm, is aware of tx plans. No family at bedside during [place] visit, though they have been available to staff as needed.   Emotional Assessment Appearance:  Appears stated age Attitude/Demeanor/Rapport:   (engaged, cooperative) Affect (typically observed):  Accepting Orientation:  Oriented to Self, Oriented to Place, Oriented to  Time, Oriented to Situation Alcohol / Substance use:  Not Applicable Psych involvement (Current and /or in the community):  No (Comment)  Discharge Needs  Concerns to be addressed:  Adjustment to Illness Readmission within the last 30 days:  No Current discharge risk:  Chronically ill Barriers to Discharge:  No Barriers Identified   Alonna Buckler, LCSW 03/14/2015, 9:17 PM

## 2015-03-14 NOTE — Progress Notes (Signed)
[facility] [place] NAME: Ian Townsend    MR#:  [id number]  DATE OF BIRTH:  04-21-1941  SUBJECTIVE:  Still having some wheezing/bronchospasm but improved since admission. Family at bedside.    REVIEW OF SYSTEMS:   Review of Systems  Constitutional: Negative for fever and chills.  HENT: Negative for congestion and tinnitus.   Eyes: Negative for blurred vision and double vision.  Respiratory: Positive for cough, shortness of breath and wheezing. Negative for sputum production.   Cardiovascular: Negative for chest pain, orthopnea and PND.  Gastrointestinal: Negative for nausea, vomiting, abdominal pain and diarrhea.  Genitourinary: Negative for dysuria and hematuria.  Musculoskeletal: Negative for joint pain and neck pain.  Neurological: Positive for weakness (genearlized). Negative for dizziness, sensory change and focal weakness.  All other systems reviewed and are negative.   DRUG ALLERGIES:   Allergies  Allergen Reactions  . Penicillins Hives         VITALS:  Blood pressure 164/93, pulse 94, temperature 97.8 F (36.6 C), temperature source Oral, resp. rate 20, height 5\' 6"  (1.676 m), weight 58.65 kg (129 lb 4.8 oz), SpO2 94 %.  PHYSICAL EXAMINATION:  GENERAL:  74 y.o.-year-old patient lying in the bed in mild resp. Distress.  EYES: Pupils equal, round, reactive to light and accommodation. No scleral icterus. Extraocular muscles intact.  HEENT: Head atraumatic, normocephalic. Oropharynx and nasopharynx clear.  NECK:  Supple, no jugular venous distention. No thyroid enlargement, no tenderness.  LUNGS: Scattered wheezes and rhonchi, rapid respirations, fair air movement CARDIOVASCULAR: II/VI SEM at RSB, S1, S2 regular.  No rubs, or gallops.  ABDOMEN: Soft, nontender, nondistended. Bowel sounds present. No organomegaly or mass.  EXTREMITIES: No pedal edema, cyanosis, or clubbing. + 2 pedal & radial pulses b/l.   NEUROLOGIC:  Cranial nerves II through XII are intact. No focal motor or sensory defecits b/l.  PSYCHIATRIC: The patient is alert and oriented x 3. Good affect.  SKIN: No obvious rash, lesion, or ulcer.    LABORATORY PANEL:   CBC  Recent Labs Lab 03/14/15 0355  WBC 7.8  HGB 15.3  HCT 43.5  PLT 122*   ------------------------------------------------------------------------------------------------------------------  Chemistries   Recent Labs Lab 03/14/15 0355  NA 140  K 3.4*  CL 102  CO2 28  GLUCOSE 177*  BUN 27*  CREATININE 1.46*  CALCIUM 9.1  MG 1.9   ------------------------------------------------------------------------------------------------------------------  Cardiac Enzymes  Recent Labs Lab 03/13/15 0508  TROPONINI <0.03   ------------------------------------------------------------------------------------------------------------------  RADIOLOGY:  Dg Chest Port 1 View  03/13/2015   CLINICAL DATA:  Respiratory distress  EXAM: PORTABLE CHEST - 1 VIEW  COMPARISON:  08/05/2012  FINDINGS: Normal heart size. There is widening of the lower mediastinum which is chronic. A small associated lucency is compatible with hiatal hernia. Mild hyperinflation. There is no edema, consolidation, effusion, or pneumothorax. There is tracheal deviation to the left which is chronic and from thyroid nodule seen by CT 09/12/2012  IMPRESSION: 1. No active disease. 2. Moderate hiatal hernia. 3. 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   Electronically Signed   By: Monte Fantasia M.D.   On: 03/13/2015 05:45     ASSESSMENT AND PLAN:   This is a 74 year old male admitted for acute on chronic respiratory failure with hypoxemia.  1. Acute respiratory failure due to COPD exacerbation - was on bipap and now weaned off.  Remains on [place].  - Still has significant bronchospasm and wheezing.  Cont. Aggressive treatment  for COPD for now  2. COPD exacerbation: - Continue Solu-Medrol but will  taper - We will start Spiriva and Dulera, continue duo nebs - Continue empiric Levaquin - Assessed for home oxygen prior to discharge  3. Atrial fibrillation with rapid ventricular response: Likely due to acute respiratory illness - Appreciate cardiology consultation.  Heart rate has improved on oral Cardizem - Likely chronic atrial fibrillation - Echo showing normal ejection fraction EF of 60%.   - high fall risk and therefore not a candidate for long term anticoagulation.  - cont. ASA  4. Hypertension: Continue amlodipine and lisinopril  5. Seizure disorder: No acute seizure type activity continue Dilantin  6. Benign prostatic hypertrophy: Continue tamsulosin and Vesicare  7. Depression continue Prozac  8. Hyperlipidemia continue atorvastatin  All the records are reviewed and case discussed with Care Management/Social Workerr. Management plans discussed with the patient, family and they are in agreement.  CODE STATUS: Full  TOTAL TIME TAKING CARE OF THIS PATIENT: 30 minutes.   POSSIBLE D/C IN 1-2 DAYS, DEPENDING ON CLINICAL CONDITION.   Henreitta Leber M.D on 03/14/2015 at 11:07 AM  Between 7am to 6pm - Pager - [phone]  After 6pm go to [email or web address] - password [email or web address] [place]  [facility]  Office  [phone]  CC: Primary care physician; [place] Pink, MD

## 2015-03-14 NOTE — Progress Notes (Signed)
Paged MD for PT consult. MD instructed RN to order PT consult and assess home oxygen need.

## 2015-03-14 NOTE — Evaluation (Signed)
Physical Therapy Evaluation Patient Details Name: Ian Townsend MRN: [id number] DOB: Jun 13, 1941 Today's Date: 03/14/2015   History of Present Illness  74 yo male with onset of acute respiratory failure was admitted and found to have old L side hemi symptoms from multiple neurological events, R thyroid mass.  His PLOF was RW in ALF setting, with PMHx:  chronic a-fib with RVR, HTN, acute respiratory distress with hypoxia.  Clinical Impression  Pt was seen for assessment of his tolerance for mobility, noting his O2 sats pre exercise were 96% and post were 96%;  Pulse pre and post were essentially both 80-83 b/min.  His main issue was awareness of L side with mobility and did need hands-on control of balance.  His level of help needed is low and should be able to manage with HHPT and ALF assisting him.    Follow Up Recommendations Home health PT;Supervision/Assistance - 24 hour    Equipment Recommendations  Rolling walker with 5" wheels    Recommendations for Other Services       Precautions / Restrictions Precautions Precautions: Fall (telemetry) Precaution Comments: Vital signs were stable with mobility Restrictions Weight Bearing Restrictions: No      Mobility  Bed Mobility Overal bed mobility: Needs Assistance Bed Mobility: Supine to Sit     Supine to sit: Min guard;Min assist     General bed mobility comments: issues related to L side neglect which has been a long-standing symptom  Transfers Overall transfer level: Needs assistance Equipment used: Rolling walker (2 wheeled);1 person hand held assist Transfers: Sit to/from [facility] Sit to Stand: Min guard;Min assist Stand pivot transfers: Min guard;Min assist       General transfer comment: reminders for safety and negotiation of hallways due to L side neglect  Ambulation/Gait Ambulation/Gait assistance: Min assist;Min guard Ambulation Distance (Feet): 300 Feet Assistive device: 4-wheeled  walker;1 person hand held assist Gait Pattern/deviations: Step-through pattern;Wide base of support;Trunk flexed;Drifts right/left (unaware of L side obstacles) Gait velocity: reduced Gait velocity interpretation: Below normal speed for age/gender General Gait Details: tends to let rollator get out too far in front of him  Stairs            Wheelchair Mobility    Modified Rankin (Stroke Patients Only) Modified Rankin (Stroke Patients Only) Pre-Morbid Rankin Score: Moderate disability Modified Rankin: Moderate disability     Balance Overall balance assessment: Needs assistance       Postural control: Posterior lean;Right lateral lean Standing balance support: Bilateral upper extremity supported Standing balance-Leahy Scale: Poor                               Pertinent Vitals/Pain Pain Assessment: No/denies pain    Home Living Family/patient expects to be discharged to:: Assisted living                      Prior Function Level of Independence: Needs assistance   Gait / Transfers Assistance Needed: rollator with mod I  ADL's / Homemaking Assistance Needed: ALF for care        Hand Dominance   Dominant Hand: Right    Extremity/Trunk Assessment   Upper Extremity Assessment: LUE deficits/detail       LUE Deficits / Details: neglect of LUE   Lower Extremity Assessment: LLE deficits/detail   LLE Deficits / Details: neglect but normal strength and coordination LLE  Cervical / Trunk Assessment: Kyphotic  Communication   Communication: No difficulties  Cognition Arousal/Alertness: Awake/alert Behavior During Therapy: WFL for tasks assessed/performed Overall Cognitive Status: Within Functional Limits for tasks assessed                      General Comments General comments (skin integrity, edema, etc.): PT was mainly needed to direct walker for safety and to control balance due to L side neglect along with general systemic  weakness but not related to LLE, which strength wise was [facility]    Exercises        Assessment/Plan    PT Assessment Patient needs continued PT services  PT Diagnosis Difficulty walking;Hemiplegia non-dominant side   PT Problem List Decreased range of motion;Decreased activity tolerance;Decreased balance;Decreased mobility;Cardiopulmonary status limiting activity;Other (comment) (L side awareness)  PT Treatment Interventions DME instruction;Gait training;Functional mobility training;Therapeutic activities;Balance training;Therapeutic exercise;Neuromuscular re-education;Patient/family education   PT Goals (Current goals can be found in the Care Plan section) Acute Rehab PT Goals Patient Stated Goal: none stated PT Goal Formulation: With patient Time For Goal Achievement: 03/28/15 Potential to Achieve Goals: Good    Frequency Min 2X/week   Barriers to discharge Decreased caregiver support (ALF will need to be able to assist )      Co-evaluation               End of Session Equipment Utilized During Treatment: Gait belt Activity Tolerance: Patient tolerated treatment well Patient left: in bed;with call bell/phone within reach;with bed alarm set Nurse Communication: Mobility status         Time: 6979-4801 PT Time Calculation (min) (ACUTE ONLY): 23 min   Charges:   PT Evaluation $Initial PT Evaluation Tier I: 1 Procedure PT Treatments $Gait Training: 8-22 mins   PT G CodesRamond Dial 03-21-2015, 6:05 PM   Mee Hives, PT MS Acute Rehab Dept. Number: [phone] [phone] and [place] [phone]

## 2015-03-14 NOTE — Progress Notes (Signed)
Questioning BP 183/103. Cardizem given at time of BP. Orders received from Dr. Jannifer Franklin to recheck BP in 30 minutes.

## 2015-03-14 NOTE — Care Management (Addendum)
Patient has been resident at [facility]  since Nov 2015.  Prior to that he was at  [facility] assisted living.  Discussed need for physical therapy consult and the need to assess patient for home 02 in progression rounds.  Primary nurse obtained PT order.  Spoke with patient's daughter Denton Meek.  Says that patient was able to ambulate independently at the assisted living facility with his walker.  Says that patient has had at least three previous strokes and he has "total left side neglect."  This is patient's baseline.  He can move the left side but "does not remember to do so."  The other day he was looking for his napkin and was holding it in his left time the whole time- he could not find it.

## 2015-03-14 NOTE — Progress Notes (Signed)
SUBJECTIVE:  The patient reports improvement in shortness of breath and wheezing. Ventricular rate is below 100.   Filed Vitals:   03/14/15 0122 03/14/15 0156 03/14/15 0503 03/14/15 0706  BP: 183/103 157/76 164/93   Pulse: 86  94   Temp:   97.8 F (36.6 C)   TempSrc:   Oral   Resp:   20   Height:      Weight:   58.65 kg (129 lb 4.8 oz)   SpO2: 94%  95% 94%    Intake/Output Summary (Last 24 hours) at 03/14/15 0944 Last data filed at 03/14/15 0505  Gross per 24 hour  Intake      3 ml  Output    650 ml  Net   -647 ml    LABS: Basic Metabolic Panel:  Recent Labs  03/13/15 0508 03/14/15 0355  NA 140 140  K 3.8 3.4*  CL 104 102  CO2 28 28  GLUCOSE 122* 177*  BUN 22* 27*  CREATININE 1.50* 1.46*  CALCIUM 9.2 9.1  MG  --  1.9  PHOS  --  2.7   Liver Function Tests: No results for input(s): AST, ALT, ALKPHOS, BILITOT, PROT, ALBUMIN in the last 72 hours. No results for input(s): LIPASE, AMYLASE in the last 72 hours. CBC:  Recent Labs  03/13/15 0508 03/14/15 0355  WBC 7.2 7.8  HGB 16.6 15.3  HCT 48.6 43.5  MCV 95.2 94.7  PLT 131* 122*   Cardiac Enzymes:  Recent Labs  03/13/15 0508  TROPONINI <0.03   BNP: Invalid input(s): POCBNP D-Dimer: No results for input(s): DDIMER in the last 72 hours. Hemoglobin A1C: No results for input(s): HGBA1C in the last 72 hours. Fasting Lipid Panel: No results for input(s): CHOL, HDL, LDLCALC, TRIG, CHOLHDL, LDLDIRECT in the last 72 hours. Thyroid Function Tests: No results for input(s): TSH, T4TOTAL, T3FREE, THYROIDAB in the last 72 hours.  Invalid input(s): FREET3 Anemia Panel: No results for input(s): VITAMINB12, FOLATE, FERRITIN, TIBC, IRON, RETICCTPCT in the last 72 hours.   PHYSICAL EXAM General: Well developed, in mild respiratory distress HEENT:  Normocephalic and atramatic Neck:  No JVD.  Lungs: Mild expiratory wheezing  Heart: Irregularly irregular . Normal S1 and S2 without gallops or murmurs.    Abdomen: Bowel sounds are positive, abdomen soft and non-tender  Msk:  Back normal, normal gait. Normal strength and tone for age. Extremities: No clubbing, cyanosis or edema.   Neuro: Alert and oriented X 3. Psych:  Good affect, responds appropriately  TELEMETRY: Reviewed telemetry pt in  atrial fibrillation with ventricular rate between 80-100  ASSESSMENT AND PLAN:  74 y.o. 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 He was found to have acute respiratory distress requiring 2L via [place] currently likely 2/2 acute COPD exacerbation and be in a-fib with RVR with heart rate in the 140's to 150's upon admission.   1. Chronic a-fib with RVR: -Continue rate control with diltiazem which can be changed to long-acting before hospital discharge -He has previously developed marked bradycardia associated with a-fib into the 30's on metoprolol in 01/2012, given this would  hold beta usage of beta blocker at this time unless ventricular rate dictates otherwise -Check echo to evaluate LV function and wall motion. If normal LV function can continue diltiazem and consolidate to daily dosing prior to discharge  -Not on anticoagulation as discussed in the initial consult note.  2. Acute respiratory distress with associated hypoxia: -Likely 2/2 acute COPD exacerbation, cannot exclude mild diastolic CHF exacerbation at this time -Continue Levaquin, steroids, and inhalers per IM -He seems to be improving but continues to have wheezing   3. Accelerated HTN: -Improving -Diltiazem added as above -Continue lisinopril for the time being, monitor SCr  4. CKD  stage III: -SCr currently 1.50 -Appears to be about his baseline  5. HLD: -Continue Lipitor  6. History of stroke: -Continue aspirin  -No new deficits   7. Seizure disorder: -No recent seizures -Continue current medications   Kathlyn Sacramento, MD, [facility] 03/14/2015 9:44 AM

## 2015-03-15 ENCOUNTER — Telehealth: Payer: Self-pay

## 2015-03-15 LAB — PLATELET COUNT: Platelets: 128 10*3/uL — ABNORMAL LOW (ref 150–440)

## 2015-03-15 LAB — ALBUMIN: Albumin: 3.5 g/dL (ref 3.5–5.0)

## 2015-03-15 MED ORDER — DILTIAZEM HCL ER COATED BEADS 120 MG PO CP24: 120.0000 mg | ORAL_CAPSULE | Freq: Every day | ORAL | Status: DC

## 2015-03-15 MED ORDER — LEVOFLOXACIN 500 MG PO TABS: 500.0000 mg | ORAL_TABLET | ORAL | Status: DC

## 2015-03-15 MED ORDER — LEVOFLOXACIN 750 MG PO TABS
750.0000 mg | ORAL_TABLET | ORAL | Status: DC
  Administered 2015-03-15: 750 mg via ORAL
  Filled 2015-03-15: qty 1

## 2015-03-15 MED ORDER — TIOTROPIUM BROMIDE MONOHYDRATE 18 MCG IN CAPS: 18.0000 ug | ORAL_CAPSULE | Freq: Every day | RESPIRATORY_TRACT | Status: DC

## 2015-03-15 MED ORDER — PREDNISONE 10 MG PO TABS: 5.0000 mg | ORAL_TABLET | Freq: Every day | ORAL | Status: DC

## 2015-03-15 MED ORDER — BUDESONIDE-FORMOTEROL FUMARATE 160-4.5 MCG/ACT IN AERO: 2.0000 | INHALATION_SPRAY | Freq: Two times a day (BID) | RESPIRATORY_TRACT | Status: DC

## 2015-03-15 NOTE — Discharge Instructions (Signed)
°  DIET:  Cardiac diet  DISCHARGE CONDITION:  Stable  ACTIVITY:  Activity as tolerated  OXYGEN:  Home Oxygen: No.   Oxygen Delivery: room air  DISCHARGE LOCATION:  Home with Home health.    If you experience worsening of your admission symptoms, develop shortness of breath, life threatening emergency, suicidal or homicidal thoughts you must seek medical attention immediately by calling 911 or calling your MD immediately  if symptoms less severe.  You Must read complete instructions/literature along with all the possible adverse reactions/side effects for all the Medicines you take and that have been prescribed to you. Take any new Medicines after you have completely understood and accpet all the possible adverse reactions/side effects.   Please note  You were cared for by a hospitalist during your hospital stay. If you have any questions about your discharge medications or the care you received while you were in the hospital after you are discharged, you can call the unit and asked to speak with the hospitalist on call if the hospitalist that took care of you is not available. Once you are discharged, your primary care physician will handle any further medical issues. Please note that NO REFILLS for any discharge medications will be authorized once you are discharged, as it is imperative that you return to your primary care physician (or establish a relationship with a primary care physician if you do not have one) for your aftercare needs so that they can reassess your need for medications and monitor your lab values.

## 2015-03-15 NOTE — Care Management (Addendum)
Spoke with [facility] and they prefer to use [facility]. Referral made to Corliss Blacker with [facility] via email. Patient discharging back to [facility] today followed by [facility]; CSW will assist. [facility] has been notified of patient discharge.

## 2015-03-15 NOTE — Progress Notes (Signed)
Pt for d/c back to [facility] today. Pt's dtr at bedside and reports agreeable to d/c and that she will provide transport. FL2 updated with d/c meds and packet complete. CSW confirmed with staff at [facility] that pt is able to return today. CSW signing off. Wandra Feinstein, MSW, [place] [phone]

## 2015-03-15 NOTE — Telephone Encounter (Signed)
-----   Message from Clarisse Gouge sent at 03/15/2015  9:20 AM EDT ----- Regarding: TCM/PH TCM/PH for  a/c resp. fail. hypoxemia d/c 03-15-15 [facility] needs 1-2 week f/u.  Scheduled on 04-11-15 at 10:30 and added to wait list.

## 2015-03-15 NOTE — Progress Notes (Signed)
Patient discharging back to [facility] and will be taken by daughter. COPD education given to patient and daughter along with cardizem education. Social work created packet for patient and daughter to take to [facility]. No further questions. Prescriptions sent in packet. IV and tele discontinued.

## 2015-03-15 NOTE — Discharge Summary (Signed)
[facility] [place] NAME: Ian Townsend    MR#:  [id number]  DATE OF BIRTH:  11-24-1940  DATE OF ADMISSION:  03/13/2015 ADMITTING PHYSICIAN: Harrie Foreman, MD  DATE OF DISCHARGE: 03/15/2015 11:37 AM  PRIMARY CARE PHYSICIAN: Maryland Pink, MD    ADMISSION DIAGNOSIS:  COPD with acute exacerbation [J44.1]  DISCHARGE DIAGNOSIS:  Active Problems:   A-fib   Acute on chronic respiratory failure with hypoxemia   Atrial fibrillation with RVR   COPD with acute exacerbation   SECONDARY DIAGNOSIS:   Past Medical History  Diagnosis Date  . Hyperlipidemia   . Esophageal reflux   . Seasonal allergies   . COPD (chronic obstructive pulmonary disease)   . Benign prostatic hypertrophy   . Unresponsiveness 02/08/2012  . Syncope   . Chronic atrial fibrillation     a. not on long term anticoagulation 2/2 high risk fall and multiple falls  . Chronic kidney disease     a. stage III; b. baseline SCr approx 1.50  . PVD (peripheral vascular disease)   . Broken arm     left  . Hypertension   . Stroke     a. x2, last approx 2011  . Seizures     a. at time of last stroke  . Neglect of one side of body     a. Left 2/2 stroke  . 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 Mitral regurgitation     a. echo 2013: EF 65%, mod MR/TR, LA 4.4 cm, mod dilated RA    HOSPITAL COURSE:   74 year old male with past medical history of hypertension, chronic atrial fibrillation, COPD, chronic disease, peripheral vascular disease, history of previous CVA, history of seizures, alcohol abuse, who presented to the hospital due to shortness of breath and cough and noted to be in COPD exacerbation.  1. Acute respiratory failure due to COPD exacerbation - initially when patient was admitted patient was placed on BiPAP although quickly weaned off of it.  - Patient was maintained on aggressive therapy for COPD with IV steroids, around-the-clock DuoNeb's,  inhalers, and empiric antibiotics. Patient has been weaned off oxygen and was ambulated and did not qualify for home oxygen. Patient's bronchospasm and wheezing is improved and therefore is being discharged home  2. COPD exacerbation: Likely cause of patient's acute respiratory failure as mentioned above. Patient was treated with IV steroids, around-the-clock DuoNeb's, started on Dulera and Spiriva in the hospital. Patient was also given empiric IV antibiotics with Levaquin. Clinically patient is improved with less wheezing and bronchospasm. He was ambulated did not qualify for home oxygen. Presently patient is being discharged on oral prednisone taper, Symbicort, Spiriva, and empiric antibiotics with Levaquin.   3. Atrial fibrillation with rapid ventricular response: Likely due to acute respiratory illness - Appreciate cardiology consultation. Heart rate has improved on oral Cardizem and patient was discharged on it - Echo showing normal ejection fraction EF of 60%.  - Patient not on long term and lactation due to high fall risk, continue aspirin  4. Hypertension: Continue amlodipine, lisinopril, Cardizem  5. Seizure disorder: No acute seizure type activity continue Dilantin  6. Benign prostatic hypertrophy: Continue tamsulosin and Vesicare  7. Depression continue Prozac  8. Hyperlipidemia continue atorvastatin  Patient was seen by physical therapy and thought he would benefit from home health nursing and PT services which was arranged for him prior to discharge.  DISCHARGE CONDITIONS:  Stable  CONSULTS OBTAINED:  Treatment Team:  Minna Merritts, MD  DRUG ALLERGIES:   Allergies  Allergen Reactions  . Penicillins Hives         DISCHARGE MEDICATIONS:   Discharge Medication List as of 03/15/2015 11:10 AM    START taking these medications   Details  budesonide-formoterol (SYMBICORT) 160-4.5 MCG/ACT inhaler Inhale 2 puffs into the lungs 2 (two) times daily., Starting  03/15/2015, Until Discontinued, Print    diltiazem (CARDIZEM CD) 120 MG 24 hr capsule Take 1 capsule (120 mg total) by mouth daily., Starting 03/15/2015, Until Discontinued, Print    levofloxacin (LEVAQUIN) 500 MG tablet Take 1 tablet (500 mg total) by mouth every other day., Starting 03/15/2015, Until Discontinued, Print    predniSONE (DELTASONE) 10 MG tablet Take 0.5 tablets (5 mg total) by mouth daily with breakfast., Starting 03/15/2015, Until Discontinued, Print    tiotropium (SPIRIVA) 18 MCG inhalation capsule Place 1 capsule (18 mcg total) into inhaler and inhale daily., Starting 03/15/2015, Until Discontinued, Print      CONTINUE these medications which have NOT CHANGED   Details  acetaminophen (TYLENOL) 325 MG tablet Take 650 mg by mouth every 4 (four) hours as needed for pain or fever. , Until Discontinued, Historical Med    amLODipine (NORVASC) 5 MG tablet Take 5 mg by mouth daily. 9 AM, Until Discontinued, Historical Med    aspirin 325 MG tablet Take 1 tablet (325 mg total) by mouth daily. 9 AM, Starting 02/28/2015, Until Discontinued, Normal    atorvastatin (LIPITOR) 20 MG tablet Take 20 mg by mouth daily. 9 PM, Until Discontinued, Historical Med    Cholecalciferol (VITAMIN D-3) 1000 UNITS CAPS Take 2 capsules by mouth daily. 9 AM, Until Discontinued, Historical Med    finasteride (PROSCAR) 5 MG tablet Take 5 mg by mouth daily. 9 am, Until Discontinued, Historical Med    FLUoxetine (PROZAC) 20 MG tablet Take 20 mg by mouth daily. 9 AM, Until Discontinued, Historical Med    gabapentin (NEURONTIN) 100 MG capsule Take 1 capsule by mouth at bedtime. 9 AM, Starting 06/12/2013, Until Discontinued, Historical Med    lisinopril (PRINIVIL,ZESTRIL) 20 MG tablet Take 1 tablet (20 mg total) by mouth daily., Starting 05/14/2014, Until Discontinued, No Print    Melatonin 5 MG TABS Take by mouth daily. 8 PM, Until Discontinued, Historical Med    phenytoin (DILANTIN) 100 MG ER capsule Take 100  mg by mouth daily. 9 AM, Until Discontinued, Historical Med    PHENYTOIN INFATABS 50 MG tablet 50 mg once. 9 AM, Starting 06/12/2013, Historical Med    polyethylene glycol (MIRALAX / GLYCOLAX) packet Take 17 g by mouth daily. 9 AM, Until Discontinued, Historical Med    solifenacin (VESICARE) 10 MG tablet Take 10 mg by mouth daily. 9 AM, Until Discontinued, Historical Med    tamsulosin (FLOMAX) 0.4 MG CAPS Take 0.4 mg by mouth daily. 9 AM, Until Discontinued, Historical Med    miconazole (LOTRIMIN AF) 2 % powder Apply topically as needed for itching., Until Discontinued, Historical Med         DISCHARGE INSTRUCTIONS:   DIET:  Cardiac diet  DISCHARGE CONDITION:  Stable  ACTIVITY:  Activity as tolerated  OXYGEN:  Home Oxygen: No.   Oxygen Delivery: room air  DISCHARGE LOCATION:  Home with home health nursing and physical therapy services   If you experience worsening of your admission symptoms, develop shortness of breath, life threatening emergency, suicidal or homicidal thoughts you must seek medical attention  immediately by calling 911 or calling your MD immediately  if symptoms less severe.  You Must read complete instructions/literature along with all the possible adverse reactions/side effects for all the Medicines you take and that have been prescribed to you. Take any new Medicines after you have completely understood and accpet all the possible adverse reactions/side effects.   Please note  You were cared for by a hospitalist during your hospital stay. If you have any questions about your discharge medications or the care you received while you were in the hospital after you are discharged, you can call the unit and asked to speak with the hospitalist on call if the hospitalist that took care of you is not available. Once you are discharged, your primary care physician will handle any further medical issues. Please note that NO REFILLS for any discharge medications will  be authorized once you are discharged, as it is imperative that you return to your primary care physician (or establish a relationship with a primary care physician if you do not have one) for your aftercare needs so that they can reassess your need for medications and monitor your lab values.     Today    VITAL SIGNS:  Blood pressure 172/73, pulse 98, temperature 98 F (36.7 C), temperature source Oral, resp. rate 2, height 5\' 6"  (1.676 m), weight 60.51 kg (133 lb 6.4 oz), SpO2 92 %.  I/O:   Intake/Output Summary (Last 24 hours) at 03/15/15 1441 Last data filed at 03/15/15 0830  Gross per 24 hour  Intake    483 ml  Output    100 ml  Net    383 ml    PHYSICAL EXAMINATION:  GENERAL:  74 y.o.-year-old patient lying in the bed with no acute distress.  EYES: Pupils equal, round, reactive to light and accommodation. No scleral icterus. Extraocular muscles intact.  HEENT: Head atraumatic, normocephalic. Oropharynx and nasopharynx clear.  NECK:  Supple, no jugular venous distention. No thyroid enlargement, no tenderness.  LUNGS: Prolonged insp & exp. phase. Minimal end-exp. wheezing No use of accessory muscles of respiration.  CARDIOVASCULAR: S1, S2 normal. No murmurs, rubs, or gallops.  ABDOMEN: Soft, non-tender, non-distended. Bowel sounds present. No organomegaly or mass.  EXTREMITIES: No pedal edema, cyanosis, or clubbing.  NEUROLOGIC: Cranial nerves II through XII are intact. No focal motor or sensory defecits b/l.  PSYCHIATRIC: The patient is alert and oriented x 3. Good affect.  SKIN: No obvious rash, lesion, or ulcer.   DATA REVIEW:   CBC  Recent Labs Lab 03/14/15 0355 03/15/15 0501  WBC 7.8  --   HGB 15.3  --   HCT 43.5  --   PLT 122* 128*    Chemistries   Recent Labs Lab 03/14/15 0355  NA 140  K 3.4*  CL 102  CO2 28  GLUCOSE 177*  BUN 27*  CREATININE 1.46*  CALCIUM 9.1  MG 1.9    Cardiac Enzymes  Recent Labs Lab 03/13/15 0508  TROPONINI <0.03     Microbiology Results  Results for orders placed or performed during the hospital encounter of 03/13/15  MRSA PCR Screening     Status: None   Collection Time: 03/13/15  4:31 PM  Result Value Ref Range Status   MRSA by PCR NEGATIVE NEGATIVE Final    Comment:        The GeneXpert MRSA Assay (FDA approved for NASAL specimens only), is one component of a comprehensive MRSA colonization surveillance program. It is not intended  to diagnose MRSA infection nor to guide or monitor treatment for MRSA infections.     RADIOLOGY:  No results found.    Management plans discussed with the patient, family and they are in agreement.  CODE STATUS:   TOTAL TIME TAKING CARE OF THIS PATIENT: 40 minutes.    Henreitta Leber M.D on 03/15/2015 at 2:41 PM  Between 7am to 6pm - Pager - [phone]  After 6pm go to [email or web address] - password [email or web address] [facility]  [facility]  Office  [phone]  CC: Primary care physician; Maryland Pink, MD

## 2015-03-15 NOTE — Telephone Encounter (Signed)
Patient contacted regarding discharge from [facility] on 03/15/15.  Patient understands to follow up with provider Dr. Fletcher Anon on 04/11/15 at 10:30 at [facility]. Patient understands discharge instructions? yes Patient understands medications and regiment? yes Patient understands to bring all medications to this visit? yes  Spoke w/ pt's daughter.  Pt is being d/c'd from [facility] now and she will call back w/ any questions or concerns.

## 2015-03-22 ENCOUNTER — Encounter: Payer: Self-pay | Admitting: Gastroenterology

## 2015-03-23 ENCOUNTER — Encounter: Payer: Self-pay | Admitting: Emergency Medicine

## 2015-03-23 ENCOUNTER — Emergency Department
Admission: EM | Admit: 2015-03-23 | Discharge: 2015-03-23 | Disposition: A | Discharge status: Home / Self Care | Payer: Medicare Other | Attending: Emergency Medicine | Admitting: Emergency Medicine

## 2015-03-23 ENCOUNTER — Emergency Department: Payer: Medicare Other

## 2015-03-23 DIAGNOSIS — Y9289 Other specified places as the place of occurrence of the external cause: Secondary | ICD-10-CM | POA: Insufficient documentation

## 2015-03-23 DIAGNOSIS — Y998 Other external cause status: Secondary | ICD-10-CM | POA: Insufficient documentation

## 2015-03-23 DIAGNOSIS — Z880 Allergy status to penicillin: Secondary | ICD-10-CM | POA: Insufficient documentation

## 2015-03-23 DIAGNOSIS — Z7982 Long term (current) use of aspirin: Secondary | ICD-10-CM | POA: Insufficient documentation

## 2015-03-23 DIAGNOSIS — I10 Essential (primary) hypertension: Secondary | ICD-10-CM | POA: Insufficient documentation

## 2015-03-23 DIAGNOSIS — S51801A Unspecified open wound of right forearm, initial encounter: Secondary | ICD-10-CM | POA: Diagnosis not present

## 2015-03-23 DIAGNOSIS — S0990XA Unspecified injury of head, initial encounter: Secondary | ICD-10-CM | POA: Diagnosis present

## 2015-03-23 DIAGNOSIS — S0081XA Abrasion of other part of head, initial encounter: Secondary | ICD-10-CM | POA: Diagnosis not present

## 2015-03-23 DIAGNOSIS — S51811A Laceration without foreign body of right forearm, initial encounter: Secondary | ICD-10-CM

## 2015-03-23 DIAGNOSIS — Y9389 Activity, other specified: Secondary | ICD-10-CM | POA: Diagnosis not present

## 2015-03-23 DIAGNOSIS — W01198A Fall on same level from slipping, tripping and stumbling with subsequent striking against other object, initial encounter: Secondary | ICD-10-CM | POA: Diagnosis not present

## 2015-03-23 DIAGNOSIS — Z79899 Other long term (current) drug therapy: Secondary | ICD-10-CM | POA: Diagnosis not present

## 2015-03-23 DIAGNOSIS — Z720 Tobacco use: Secondary | ICD-10-CM | POA: Insufficient documentation

## 2015-03-23 NOTE — Discharge Instructions (Signed)
You're treated for right arm skin tear and abrasions to the face without any other apparent significant traumatic injury from your call. Return to the emergency department for any new or worsening pain, including chest pain, trouble breathing, weakness, numbness, or any altered mental status. Apply neomycin or other ointment such as Vaseline to skin tear and abrasions and cover with a nonadherent dressing once to twice daily until healed.  Abrasions An abrasion is a cut or scrape of the skin. Abrasions do not go through all layers of the skin. HOME CARE  If a bandage (dressing) was put on your wound, change it as told by your doctor. If the bandage sticks, soak it off with warm.  Wash the area with water and soap 2 times a day. Rinse off the soap. Pat the area dry with a clean towel.  Put on medicated cream (ointment) as told by your doctor.  Change your bandage right away if it gets wet or dirty.  Only take medicine as told by your doctor.  See your doctor within 24-48 hours to get your wound checked.  Check your wound for redness, puffiness (swelling), or yellowish-white fluid (pus). GET HELP RIGHT AWAY IF:   You have more pain in the wound.  You have redness, swelling, or tenderness around the wound.  You have pus coming from the wound.  You have a fever or lasting symptoms for more than 2-3 days.  You have a fever and your symptoms suddenly get worse.  You have a bad smell coming from the wound or bandage. MAKE SURE YOU:   Understand these instructions.  Will watch your condition.  Will get help right away if you are not doing well or get worse. Document Released: 03/23/2008 Document Revised: 06/29/2012 Document Reviewed: 09/08/2011 [facility] Patient Information 2015 [place], [place]. This information is not intended to replace advice given to you by your health care provider. Make sure you discuss any questions you have with your health care provider.  Skin Tear Care A  skin tear is a wound in which the top layer of skin has peeled off. This is a common problem with aging because the skin becomes thinner and more fragile as a person gets older. In addition, some medicines, such as oral corticosteroids, can lead to skin thinning if taken for long periods of time.  A skin tear is often repaired with tape or skin adhesive strips. This keeps the skin that has been peeled off in contact with the healthier skin beneath. Depending on the location of the wound, a bandage (dressing) may be applied over the tape or skin adhesive strips. Sometimes, during the healing process, the skin turns black and dies. Even when this happens, the torn skin acts as a good dressing until the skin underneath gets healthier and repairs itself. HOME CARE INSTRUCTIONS   Change dressings once per day or as directed by your caregiver.  Gently clean the skin tear and the area around the tear using saline solution or mild soap and water.  Do not rub the injured skin dry. Let the area air dry.  Apply petroleum jelly or an antibiotic cream or ointment to keep the tear moist. This will help the wound heal. Do not allow a scab to form.  If the dressing sticks before the next dressing change, moisten it with warm soapy water and gently remove it.  Protect the injured skin until it has healed.  Only take over-the-counter or prescription medicines as directed by your caregiver.  Take showers or baths using warm soapy water. Apply a new dressing after the shower or bath.  Keep all follow-up appointments as directed by your caregiver.  SEEK IMMEDIATE MEDICAL CARE IF:   You have redness, swelling, or increasing pain in the skin tear.  You havepus coming from the skin tear.  You have chills.  You have a red streak that goes away from the skin tear.  You have a bad smell coming from the tear or dressing.  You have a fever or persistent symptoms for more than 2-3 days.  You have a fever and  your symptoms suddenly get worse. MAKE SURE YOU:  Understand these instructions.  Will watch this condition.  Will get help right away if your child is not doing well or gets worse. Document Released: 06/30/2001 Document Revised: 06/29/2012 Document Reviewed: 04/18/2012 [facility] Patient Information 2015 [place], [place]. This information is not intended to replace advice given to you by your health care provider. Make sure you discuss any questions you have with your health care provider.

## 2015-03-23 NOTE — ED Provider Notes (Signed)
[facility] Emergency Department Provider Note   ____________________________________________  Time seen: 2:45 PM I have reviewed the triage vital signs and the triage nursing note.  HISTORY  Chief Complaint Fall   Historian Patient and his son  HPI Ian Townsend is a 74 y.o. male who lives at a nursing home facility and is supposed to walk with a walker. He was going outside for a smoke break and decided to go without his walker. He stumbled on his feet and caught himself with his right arm however he did sustain a skin tear to his right forearm and strike his head. He does take a baby aspirin. He did not have a loss of consciousness. No altered mental status. He's had no chest pain and no neck pain and no abdominal pain and no extremity bony pain. Symptoms are Consider mild.    Past Medical History  Diagnosis Date  . Hyperlipidemia   . Esophageal reflux   . Seasonal allergies   . COPD (chronic obstructive pulmonary disease)   . Benign prostatic hypertrophy   . Unresponsiveness 02/08/2012  . Syncope   . Chronic atrial fibrillation     a. not on long term anticoagulation 2/2 high risk fall and multiple falls  . Chronic kidney disease     a. stage III; b. baseline SCr approx 1.50  . PVD (peripheral vascular disease)   . Broken arm     left  . Hypertension   . Stroke     a. x2, last approx 2011  . Seizures     a. at time of last stroke  . Neglect of one side of body     a. Left 2/2 stroke  . 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 Mitral regurgitation     a. echo 2013: EF 65%, mod MR/TR, LA 4.4 cm, mod dilated RA    Patient Active Problem List   Diagnosis Date Noted  . Acute on chronic respiratory failure with hypoxemia 03/13/2015  . Atrial fibrillation with RVR   . COPD with acute exacerbation   . Hypertension   . Preop cardiovascular exam 10/21/2012  . A-fib 03/29/2012  . SSS (sick sinus syndrome) 03/29/2012  . Syncope 03/29/2012   . Dizziness 03/29/2012  . Bilateral inguinal hernia 06/11/2011    Past Surgical History  Procedure Laterality Date  . Colonoscopy  2008    polyps  . Upper gastrointestinal endoscopy  2008  . Hernia repair Bilateral 07-28-2013    inguinal  . Fracture surgery      ankle  . Esophagogastroduodenoscopy N/A 02/21/2015    Procedure: ESOPHAGOGASTRODUODENOSCOPY (EGD);  Surgeon: Lucilla Lame, MD;  Location: [place];  Service: Gastroenterology;  Laterality: N/A;    Current Outpatient Rx  Name  Route  Sig  Dispense  Refill  . acetaminophen (TYLENOL) 325 MG tablet   Oral   Take 650 mg by mouth every 4 (four) hours as needed for pain or fever.          Marland Kitchen amLODipine (NORVASC) 5 MG tablet   Oral   Take 5 mg by mouth daily. 9 AM         . aspirin 325 MG tablet   Oral   Take 1 tablet (325 mg total) by mouth daily. 9 AM   11 tablet   0   . atorvastatin (LIPITOR) 20 MG tablet   Oral   Take 20 mg by mouth daily. 9 PM         .  budesonide-formoterol (SYMBICORT) 160-4.5 MCG/ACT inhaler   Inhalation   Inhale 2 puffs into the lungs 2 (two) times daily.   1 Inhaler   12   . Cholecalciferol (VITAMIN D-3) 1000 UNITS CAPS   Oral   Take 2 capsules by mouth daily. 9 AM         . diltiazem (CARDIZEM CD) 120 MG 24 hr capsule   Oral   Take 1 capsule (120 mg total) by mouth daily.   60 capsule   1   . finasteride (PROSCAR) 5 MG tablet   Oral   Take 5 mg by mouth daily. 9 am         . FLUoxetine (PROZAC) 20 MG tablet   Oral   Take 20 mg by mouth daily. 9 AM         . gabapentin (NEURONTIN) 100 MG capsule   Oral   Take 1 capsule by mouth at bedtime. 9 AM         . levofloxacin (LEVAQUIN) 500 MG tablet   Oral   Take 1 tablet (500 mg total) by mouth every other day.   10 tablet   0   . lisinopril (PRINIVIL,ZESTRIL) 20 MG tablet   Oral   Take 1 tablet (20 mg total) by mouth daily.   90 tablet   3   . Melatonin 5 MG TABS   Oral   Take by mouth daily. 8  PM         . miconazole (LOTRIMIN AF) 2 % powder   Topical   Apply topically as needed for itching.         . phenytoin (DILANTIN) 100 MG ER capsule   Oral   Take 100 mg by mouth daily. 9 AM         . PHENYTOIN INFATABS 50 MG tablet      50 mg once. 9 AM         . polyethylene glycol (MIRALAX / GLYCOLAX) packet   Oral   Take 17 g by mouth daily. 9 AM         . predniSONE (DELTASONE) 10 MG tablet   Oral   Take 0.5 tablets (5 mg total) by mouth daily with breakfast.   15 tablet   0     Label  & dispense according to the schedule below. ...   . solifenacin (VESICARE) 10 MG tablet   Oral   Take 10 mg by mouth daily. 9 AM         . tamsulosin (FLOMAX) 0.4 MG CAPS   Oral   Take 0.4 mg by mouth daily. 9 AM         . tiotropium (SPIRIVA) 18 MCG inhalation capsule   Inhalation   Place 1 capsule (18 mcg total) into inhaler and inhale daily.   30 capsule   12     Allergies Penicillins  Family History  Problem Relation Age of Onset  . Diabetes Mellitus II Mother     Social History History  Substance Use Topics  . Smoking status: Current Every Day Smoker -- 0.25 packs/day for 50 years    Types: Cigars    Last Attempt to Quit: 08/30/2010  . Smokeless tobacco: Not on file  . Alcohol Use: No    Review of Systems  Constitutional: Negative for fever. Eyes: Negative for visual changes. ENT: Negative for sore throat. Cardiovascular: Negative for chest pain. Respiratory: Negative for shortness of breath. Gastrointestinal: Negative for abdominal pain, vomiting  and diarrhea. Genitourinary: Negative for dysuria. Musculoskeletal: Negative for back pain. Skin: Negative for rash. Neurological: Negative for headaches, focal weakness or numbness.  ____________________________________________   PHYSICAL EXAM:  VITAL SIGNS: ED Triage Vitals  Enc Vitals Group     BP 03/23/15 1357 103/54 mmHg     Pulse Rate 03/23/15 1357 68     Resp --      Temp  03/23/15 1357 98.3 F (36.8 C)     Temp Source 03/23/15 1357 Oral     SpO2 03/23/15 1357 96 %     Weight 03/23/15 1357 137 lb 2 oz (62.2 kg)     Height 03/23/15 1357 5\' 6"  (1.676 m)     Head Cir --      Peak Flow --      Pain Score 03/23/15 1359 0     Pain Loc --      Pain Edu? --      Excl. in [place]? --      Constitutional: Alert and oriented. Well appearing and in no distress. Eyes: Conjunctivae are normal. PERRL. Normal extraocular movements. ENT   Head: Normocephalic.  Small bump to the right frontal scalp area. Minor abrasions over the right nasal labial fold and the right chin in and amongst his facial hair.   Nose: No congestion/rhinnorhea.   Mouth/Throat: Mucous membranes are moist. He has no teeth. There is a bruise on the inner upper lip.   Neck: No stridor. No neck pain including no cervical C-spine point tenderness. Cardiovascular: Normal rate, regular rhythm.  No murmurs, rubs, or gallops. Respiratory: Normal respiratory effort without tachypnea nor retractions. Breath sounds are clear and equal bilaterally. No wheezes/rales/rhonchi. Gastrointestinal: Soft and nontender. No distention.  Genitourinary: Deferred Musculoskeletal: Nontender with normal range of motion in all extremities. No joint effusions.  No lower extremity tenderness nor edema. Right forearm with stellate skin tear and no bony point tenderness. Neurologically intact. Neurologic:  Normal speech and language. No gross focal neurologic deficits are appreciated. Skin:  Skin is warm, dry and intact. No rash noted. Psychiatric: Mood and affect are normal. Speech and behavior are normal. Patient exhibits appropriate insight and judgment.  ____________________________________________   EKG  None ____________________________________________  LABS (pertinent positives/negatives)  None  ____________________________________________  RADIOLOGY Radiologist results reviewed  CT head noncontrast:  No acute intracranial findings. __________________________________________  PROCEDURES  Procedure(s) performed: Application of bacitracin and wound dressing to his right forearm skin tear, performed by the nurse. Critical Care performed: None  ____________________________________________   ED COURSE / ASSESSMENT AND PLAN  Pertinent labs & imaging results that were available during my care of the patient were reviewed by me and considered in my medical decision making (see chart for details).  Mechanical fall with an obvious abrasions to the face as well as right arm skin tear but no other bony point tenderness. I did discuss with the family obtaining a head CT to rule out intracranial trauma given the head injury plus elderly plus on the baby aspirin. I was able to clear his C-spine clinically.  The nurse dressed his right arm skin tear. Patient's son will take him back to his nursing home.   ___________________________________________   FINAL CLINICAL IMPRESSION(S) / ED DIAGNOSES   Final diagnoses:  Skin tear of right forearm without complication, initial encounter  Abrasion of face, initial encounter      Lisa Roca, MD 03/23/15 1539

## 2015-03-23 NOTE — ED Notes (Signed)
Skin tear noted to left forearm.

## 2015-03-23 NOTE — ED Notes (Signed)
Patient is a resident of [facility], ambulating outside when he tripped and fell. Patient sustained skin tear injury to his right arm, and abrasions to his right face. Wound to the right arm comes wrapped by [facility]. Denies LOC

## 2015-04-04 ENCOUNTER — Telehealth: Payer: Self-pay

## 2015-04-04 NOTE — Telephone Encounter (Signed)
-----   Message from Lucilla Lame, MD sent at 03/22/2015 12:33 PM EDT ----- Ian Townsend, Let the patient know that biopsy showed inflammation of the esophagus.

## 2015-04-04 NOTE — Telephone Encounter (Signed)
LVM with pt's daughter to return my call.

## 2015-04-08 ENCOUNTER — Telehealth: Payer: Self-pay

## 2015-04-08 NOTE — Telephone Encounter (Signed)
Mailed pt results.

## 2015-04-08 NOTE — Telephone Encounter (Signed)
Tried contacting pt's daughter. Left message for her to return my call. Mailed results.

## 2015-04-08 NOTE — Telephone Encounter (Signed)
-----   Message from Lucilla Lame, MD sent at 03/22/2015 12:33 PM EDT ----- Tangelia Sanson, Let the patient know that biopsy showed inflammation of the esophagus.

## 2015-04-11 ENCOUNTER — Ambulatory Visit (INDEPENDENT_AMBULATORY_CARE_PROVIDER_SITE_OTHER): Payer: Medicare Other | Admitting: Cardiovascular Disease

## 2015-04-11 ENCOUNTER — Encounter: Payer: Self-pay | Admitting: Cardiovascular Disease

## 2015-04-11 ENCOUNTER — Telehealth: Payer: Self-pay | Admitting: Cardiovascular Disease

## 2015-04-11 VITALS — BP 120/60 | HR 74 | Ht 66.0 in | Wt 129.0 lb

## 2015-04-11 DIAGNOSIS — I10 Essential (primary) hypertension: Secondary | ICD-10-CM

## 2015-04-11 DIAGNOSIS — I482 Chronic atrial fibrillation, unspecified: Secondary | ICD-10-CM | POA: Insufficient documentation

## 2015-04-11 DIAGNOSIS — I4891 Unspecified atrial fibrillation: Secondary | ICD-10-CM | POA: Diagnosis not present

## 2015-04-11 NOTE — Assessment & Plan Note (Signed)
Blood pressure is controlled. I discontinued amlodipine given that he is on diltiazem.

## 2015-04-11 NOTE — Progress Notes (Signed)
Primary care physician: Dr. Kary Kos   HPI  74 y.o. male with h/o chronic atrial fibrillation not on long term anticoagulation 2/2 high fall risk and multiple falls, history of stroke in 2011 with left sided neglect, seizures at time of stroke in 2011, significant gait instability, CKD stage III, COPD not on home O2, HTN, HLD, history of syncope, and PVD who is here today for a follow-up visit after recent hospitalization. He presented to [facility] on 5/25 after developing increased SOB x 2 days at his nursing home and being brought in by his son. He was found to have acute respiratory distress requiring 2L via [place] currently likely 2/2 acute COPD exacerbation and be in a-fib with RVR with heart rate in the 140's to 150's upon admission.  The patient improved with treatment of underlying condition and the addition of diltiazem for rate control. He was felt to be still too high risk for recurrent falls. Echocardiogram showed normal LV systolic function with no significant pulmonary hypertension.  He had a prior admission to [facility] 01/2012 secondary to unresponsiveness x 30 minutes in bed overnight which he was making agonal breath noises found to be in a-fib with bradycardic rates into the 30's in the ED on telemetry and overnight after admission. Metoprolol was held with reported improvement in heart rate. Echo showed normal LV function left atrium 4.4 cm, right atrium moderately dilated, moderate MR, moderate TR. He has been doing reasonably well since hospital discharge. He continues to have instability with walking.    Allergies  Allergen Reactions  . Penicillins Hives          Current Outpatient Prescriptions on File Prior to Visit  Medication Sig Dispense Refill  . acetaminophen (TYLENOL) 325 MG tablet Take 650 mg by mouth every 4 (four) hours as needed for pain or fever.     Marland Kitchen aspirin 325 MG tablet Take 1 tablet (325 mg total) by mouth daily. 9 AM 11 tablet 0  . atorvastatin (LIPITOR) 20 MG  tablet Take 20 mg by mouth daily. 9 PM    . Cholecalciferol (VITAMIN D-3) 1000 UNITS CAPS Take 2 capsules by mouth daily. 9 AM    . diltiazem (CARDIZEM CD) 120 MG 24 hr capsule Take 1 capsule (120 mg total) by mouth daily. 60 capsule 1  . finasteride (PROSCAR) 5 MG tablet Take 5 mg by mouth daily. 9 am    . FLUoxetine (PROZAC) 20 MG tablet Take 20 mg by mouth daily. 9 AM    . gabapentin (NEURONTIN) 100 MG capsule Take 1 capsule by mouth at bedtime. 9 AM    . lisinopril (PRINIVIL,ZESTRIL) 20 MG tablet Take 1 tablet (20 mg total) by mouth daily. 90 tablet 3  . Melatonin 5 MG TABS Take by mouth daily. 8 PM    . phenytoin (DILANTIN) 100 MG ER capsule Take 100 mg by mouth daily. 9 AM    . PHENYTOIN INFATABS 50 MG tablet 50 mg once. 9 AM    . polyethylene glycol (MIRALAX / GLYCOLAX) packet Take 17 g by mouth daily. 9 AM    . solifenacin (VESICARE) 10 MG tablet Take 10 mg by mouth daily. 9 AM    . tamsulosin (FLOMAX) 0.4 MG CAPS Take 0.4 mg by mouth daily. 9 AM    . tiotropium (SPIRIVA) 18 MCG inhalation capsule Place 1 capsule (18 mcg total) into inhaler and inhale daily. 30 capsule 12   No current facility-administered medications on file prior to visit.  Past Medical History  Diagnosis Date  . Hyperlipidemia   . Esophageal reflux   . Seasonal allergies   . COPD (chronic obstructive pulmonary disease)   . Benign prostatic hypertrophy   . Unresponsiveness 02/08/2012  . Syncope   . Chronic atrial fibrillation     a. not on long term anticoagulation 2/2 high risk fall and multiple falls  . Chronic kidney disease     a. stage III; b. baseline SCr approx 1.50  . PVD (peripheral vascular disease)   . Broken arm     left  . Hypertension   . Stroke     a. x2, last approx 2011  . Seizures     a. at time of last stroke  . Neglect of one side of body     a. Left 2/2 stroke  . 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 Mitral regurgitation     a. echo 2013: EF 65%, mod MR/TR, LA 4.4  cm, mod dilated RA     Past Surgical History  Procedure Laterality Date  . Colonoscopy  2008    polyps  . Upper gastrointestinal endoscopy  2008  . Hernia repair Bilateral 07-28-2013    inguinal  . Fracture surgery      ankle  . Esophagogastroduodenoscopy N/A 02/21/2015    Procedure: ESOPHAGOGASTRODUODENOSCOPY (EGD);  Surgeon: Lucilla Lame, MD;  Location: [place];  Service: Gastroenterology;  Laterality: N/A;     Family History  Problem Relation Age of Onset  . Diabetes Mellitus II Mother      History   Social History  . Marital Status: Single    Spouse Name: N/A  . Number of Children: N/A  . Years of Education: N/A   Occupational History  . Not on file.   Social History Main Topics  . Smoking status: Current Every Day Smoker -- 0.25 packs/day for 50 years    Types: Cigars    Last Attempt to Quit: 08/30/2010  . Smokeless tobacco: Not on file  . Alcohol Use: No  . Drug Use: No  . Sexual Activity: Not on file   Other Topics Concern  . Not on file   Social History Narrative     PHYSICAL EXAM   BP 120/60 mmHg  Pulse 74  Ht 5\' 6"  (1.676 m)  Wt 129 lb (58.514 kg)  BMI 20.83 kg/m2  Constitutional: He is oriented to person, place, and time. He appears well-developed and well-nourished. No distress.  HENT: No nasal discharge.  Head: Normocephalic and atraumatic.  Eyes: Pupils are equal and round. Right eye exhibits no discharge. Left eye exhibits no discharge.  Neck: Normal range of motion. Neck supple. No JVD present. No thyromegaly present.  Cardiovascular: Normal rate, irregular rhythm, normal heart sounds and. Exam reveals no gallop and no friction rub. No murmur heard.  Pulmonary/Chest: Effort normal and breath sounds normal. No stridor. No respiratory distress. He has no wheezes. He has no rales. He exhibits no tenderness.  Abdominal: Soft. Bowel sounds are normal. He exhibits no distension. There is no tenderness. There is no rebound and no  guarding.  Musculoskeletal: Normal range of motion. He exhibits no edema and no tenderness.  Neurological: He is alert and oriented to person, place, and time. Coordination normal.  Skin: Skin is warm and dry. No rash noted. He is not diaphoretic. No erythema. No pallor.  Psychiatric: He has a normal mood and affect. His behavior is normal. Judgment and thought  content normal.      EKG:  Atrial fibrillation  Low voltage in limb leads.   -  Nonspecific T-abnormality.   ABNORMAL   ASSESSMENT AND PLAN

## 2015-04-11 NOTE — Telephone Encounter (Signed)
Patient was dc from several meds per [facility] staff [facility] asst living and she would like to confirm and discuss prior to dc of these long term meds.  Please call by 3 if possible.   [phone].

## 2015-04-11 NOTE — Telephone Encounter (Signed)
S/w Santiago Glad, nursing staff at [facility], who had question about discontinued medications on pt AVS. Informed her that only medication discontinued by Dr. Fletcher Anon was amlodipine. Santiago Glad states he was using a topical powder which she does not see on the med list. Informed Santiago Glad that she would need to verify that with patient's PCP since it is not a cardiology medication. Santiago Glad verbalized understanding and confirmed September appointment.

## 2015-04-11 NOTE — Assessment & Plan Note (Signed)
The patient has chronic atrial fibrillation. Ventricular rate is reasonably controlled on diltiazem. He is not an anticoagulation candidate due to frequent falls and instability.

## 2015-04-11 NOTE — Patient Instructions (Signed)
Medication Instructions:  Your physician has recommended you make the following change in your medication:  STOP taking amlodipine   Labwork: none  Testing/Procedures: none  Follow-Up: Your physician recommends that you schedule a follow-up appointment in: three months with Dr. Fletcher Anon.    Any Other Special Instructions Will Be Listed Below (If Applicable).

## 2015-06-20 ENCOUNTER — Emergency Department
Admission: EM | Admit: 2015-06-20 | Discharge: 2015-06-20 | Disposition: A | Discharge status: Home / Self Care | Payer: Medicare Other | Attending: Emergency Medicine | Admitting: Emergency Medicine

## 2015-06-20 ENCOUNTER — Encounter: Payer: Self-pay | Admitting: *Deleted

## 2015-06-20 ENCOUNTER — Emergency Department: Payer: Medicare Other

## 2015-06-20 DIAGNOSIS — Z7982 Long term (current) use of aspirin: Secondary | ICD-10-CM | POA: Diagnosis not present

## 2015-06-20 DIAGNOSIS — Y9289 Other specified places as the place of occurrence of the external cause: Secondary | ICD-10-CM | POA: Diagnosis not present

## 2015-06-20 DIAGNOSIS — W19XXXA Unspecified fall, initial encounter: Secondary | ICD-10-CM

## 2015-06-20 DIAGNOSIS — N183 Chronic kidney disease, stage 3 (moderate): Secondary | ICD-10-CM | POA: Insufficient documentation

## 2015-06-20 DIAGNOSIS — Z880 Allergy status to penicillin: Secondary | ICD-10-CM | POA: Insufficient documentation

## 2015-06-20 DIAGNOSIS — Z720 Tobacco use: Secondary | ICD-10-CM | POA: Insufficient documentation

## 2015-06-20 DIAGNOSIS — Y998 Other external cause status: Secondary | ICD-10-CM | POA: Diagnosis not present

## 2015-06-20 DIAGNOSIS — W1839XA Other fall on same level, initial encounter: Secondary | ICD-10-CM | POA: Insufficient documentation

## 2015-06-20 DIAGNOSIS — I129 Hypertensive chronic kidney disease with stage 1 through stage 4 chronic kidney disease, or unspecified chronic kidney disease: Secondary | ICD-10-CM | POA: Insufficient documentation

## 2015-06-20 DIAGNOSIS — S99911A Unspecified injury of right ankle, initial encounter: Secondary | ICD-10-CM | POA: Diagnosis present

## 2015-06-20 DIAGNOSIS — M25571 Pain in right ankle and joints of right foot: Secondary | ICD-10-CM

## 2015-06-20 DIAGNOSIS — Y9389 Activity, other specified: Secondary | ICD-10-CM | POA: Diagnosis not present

## 2015-06-20 DIAGNOSIS — Z792 Long term (current) use of antibiotics: Secondary | ICD-10-CM | POA: Diagnosis not present

## 2015-06-20 DIAGNOSIS — S0990XA Unspecified injury of head, initial encounter: Secondary | ICD-10-CM | POA: Diagnosis not present

## 2015-06-20 DIAGNOSIS — Z79899 Other long term (current) drug therapy: Secondary | ICD-10-CM | POA: Diagnosis not present

## 2015-06-20 DIAGNOSIS — Z7951 Long term (current) use of inhaled steroids: Secondary | ICD-10-CM | POA: Diagnosis not present

## 2015-06-20 NOTE — Discharge Instructions (Signed)

## 2015-06-20 NOTE — ED Notes (Signed)
Pts Daughter POA answered phone and reports she will call pts son. Pts son shortly after calls this RN and reports he is able to pick up pt. Reports he will arrive in approx 20 minutes.

## 2015-06-20 NOTE — ED Notes (Signed)
[facility] called again and asked location of pt and why pt had not been home yet. This RN reported that pt was ambulatory and oriented and This RN felt the need to attempt to reach pts family in case they were able to transport pt. Santiago Glad reported it was [employer] protocol at this hour to have EMS transport. This RN reported the pts son was in route and would be bringing pt home via POV.

## 2015-06-20 NOTE — ED Notes (Signed)
Pt mentions he has a son in town. No one is answering the number on file. Will continue to try this number until 1900.

## 2015-06-20 NOTE — ED Notes (Signed)
RN called [facility] and asked about transport home. Santiago Glad informed this RN that pt would have to come back via EMS. This RN asked if there was any other type of transportation available stating that pt was able to walk and was oriented enough for a taxi or private transport. [facility] stated they would have to come back via EMS and this RN needed to call EMS.

## 2015-06-20 NOTE — ED Notes (Signed)
Pt arrived to ED vial EMS after a witness fall at [facility] assisted living. Pt reports attempting to climb a ramp and falling backwards. Pt denies symptoms leading up to fall and reports now feeing right sided ankle, Wrist and shoulder pain as well has pain in right side of head that increases with palpation. No pain to right hip and movement is intact. Pt is alert and oriented x4. No acute distress noted at this time. Vitals stable

## 2015-06-20 NOTE — ED Notes (Signed)
Pt walking into hall. Charge nurse approves moving pt from 11 to [facility] in order to more closely monitor pt.

## 2015-06-20 NOTE — ED Notes (Signed)
Pt refused wanting an ACE Wrap at this time. Pt informed he will be waiting for EMS to arrive in order to take him home.

## 2015-06-20 NOTE — ED Provider Notes (Signed)
[facility] Emergency Department Provider Note  ____________________________________________  Time seen: Approximately 3:26 PM  I have reviewed the triage vital signs and the nursing notes.   HISTORY  Chief Complaint Fall    HPI Ian Townsend is a 74 y.o. male patient is walking up a ramp and reports he thinks he lost his balance and fell down. Patient initially said he had pain in his right shoulder right wrist and right ankle. When I examine him he immediately had pain in the ankle and a little bit in his head. Patient does take aspirin. Patient did not lose consciousness no other pain is not short of breath no chest or belly   Past Medical History  Diagnosis Date  . Hyperlipidemia   . Esophageal reflux   . Seasonal allergies   . COPD (chronic obstructive pulmonary disease)   . Benign prostatic hypertrophy   . Unresponsiveness 02/08/2012  . Syncope   . Chronic atrial fibrillation     a. not on long term anticoagulation 2/2 high risk fall and multiple falls  . Chronic kidney disease     a. stage III; b. baseline SCr approx 1.50  . PVD (peripheral vascular disease)   . Broken arm     left  . Hypertension   . Stroke     a. x2, last approx 2011  . Seizures     a. at time of last stroke  . Neglect of one side of body     a. Left 2/2 stroke  . 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 Mitral regurgitation     a. echo 2013: EF 65%, mod MR/TR, LA 4.4 cm, mod dilated RA    Patient Active Problem List   Diagnosis Date Noted  . Chronic atrial fibrillation 04/11/2015  . Acute on chronic respiratory failure with hypoxemia 03/13/2015  . Atrial fibrillation with RVR   . COPD with acute exacerbation   . Hypertension   . Preop cardiovascular exam 10/21/2012  . SSS (sick sinus syndrome) 03/29/2012  . Syncope 03/29/2012  . Dizziness 03/29/2012  . Bilateral inguinal hernia 06/11/2011    Past Surgical History  Procedure Laterality Date  .  Colonoscopy  2008    polyps  . Upper gastrointestinal endoscopy  2008  . Hernia repair Bilateral 07-28-2013    inguinal  . Fracture surgery      ankle  . Esophagogastroduodenoscopy N/A 02/21/2015    Procedure: ESOPHAGOGASTRODUODENOSCOPY (EGD);  Surgeon: Lucilla Lame, MD;  Location: [place];  Service: Gastroenterology;  Laterality: N/A;    Current Outpatient Rx  Name  Route  Sig  Dispense  Refill  . acetaminophen (TYLENOL) 325 MG tablet   Oral   Take 650 mg by mouth every 4 (four) hours as needed for pain or fever.          Marland Kitchen albuterol (PROVENTIL HFA;VENTOLIN HFA) 108 (90 BASE) MCG/ACT inhaler   Inhalation   Inhale 1 puff into the lungs every 6 (six) hours as needed for wheezing or shortness of breath.         Marland Kitchen aspirin 325 MG tablet   Oral   Take 1 tablet (325 mg total) by mouth daily. 9 AM   11 tablet   0   . atorvastatin (LIPITOR) 20 MG tablet   Oral   Take 20 mg by mouth daily. 9 PM         . diltiazem (CARDIZEM CD) 120 MG 24 hr  capsule   Oral   Take 1 capsule (120 mg total) by mouth daily.   60 capsule   1   . finasteride (PROSCAR) 5 MG tablet   Oral   Take 5 mg by mouth daily. 9 am         . FLUoxetine (PROZAC) 20 MG tablet   Oral   Take 20 mg by mouth daily. 9 AM         . Fluticasone-Salmeterol (ADVAIR) 250-50 MCG/DOSE AEPB   Inhalation   Inhale 1 puff into the lungs daily.         Marland Kitchen gabapentin (NEURONTIN) 100 MG capsule   Oral   Take 1 capsule by mouth at bedtime. 9 AM         . lisinopril (PRINIVIL,ZESTRIL) 20 MG tablet   Oral   Take 1 tablet (20 mg total) by mouth daily.   90 tablet   3   . Melatonin 5 MG TABS   Oral   Take by mouth daily. 8 PM         . phenytoin (DILANTIN) 100 MG ER capsule   Oral   Take 100 mg by mouth daily. 9 AM         . PHENYTOIN INFATABS 50 MG tablet      50 mg once. 9 AM         . polyethylene glycol (MIRALAX / GLYCOLAX) packet   Oral   Take 17 g by mouth daily. 9 AM         .  solifenacin (VESICARE) 10 MG tablet   Oral   Take 10 mg by mouth daily. 9 AM         . tamsulosin (FLOMAX) 0.4 MG CAPS   Oral   Take 0.4 mg by mouth daily. 9 AM         . tiotropium (SPIRIVA) 18 MCG inhalation capsule   Inhalation   Place 1 capsule (18 mcg total) into inhaler and inhale daily.   30 capsule   12   . Cholecalciferol (VITAMIN D-3) 1000 UNITS CAPS   Oral   Take 2 capsules by mouth daily. 9 AM           Allergies Penicillins  Family History  Problem Relation Age of Onset  . Diabetes Mellitus II Mother     Social History Social History  Substance Use Topics  . Smoking status: Current Every Day Smoker -- 0.25 packs/day for 50 years    Types: Cigars, Cigarettes    Last Attempt to Quit: 08/30/2010  . Smokeless tobacco: None  . Alcohol Use: No    Review of Systems Constitutional: No fever/chills Eyes: No visual changes. ENT: No sore throat. Cardiovascular: Denies chest pain. Respiratory: Denies shortness of breath. Gastrointestinal: No abdominal pain.  No nausea, no vomiting.  No diarrhea.  No constipation. Genitourinary: Negative for dysuria. Musculoskeletal: Negative for back pain. Skin: Negative for rash. Neurological: Negative for headaches, focal weakness or numbness.  10-point ROS otherwise negative.  ____________________________________________   PHYSICAL EXAM:  VITAL SIGNS: ED Triage Vitals  Enc Vitals Group     BP 06/20/15 1511 138/88 mmHg     Pulse Rate 06/20/15 1511 92     Resp 06/20/15 1511 16     Temp 06/20/15 1511 98.4 F (36.9 C)     Temp Source 06/20/15 1511 Oral     SpO2 06/20/15 1511 97 %     Weight 06/20/15 1511 146 lb 1 oz (66.254 kg)  Height 06/20/15 1511 5\' 6"  (1.676 m)     Head Cir --      Peak Flow --      Pain Score 06/20/15 1514 6     Pain Loc --      Pain Edu? --      Excl. in [place]? --   Constitutional: Alert and oriented. Well appearing and in no acute distress. Eyes: Conjunctivae are normal. PERRL.  EOMI. Head: Atraumatic. There is some pain to palpation. Nose: No congestion/rhinnorhea. Mouth/Throat: Mucous membranes are moist.  Oropharynx non-erythematous. Neck: No stridor. Cardiovascular: Normal rate, regular rhythm. Grossly normal heart sounds.  Good peripheral circulation. Respiratory: Normal respiratory effort.  No retractions. Lungs CTAB. Gastrointestinal: Soft and nontender. No distention. No abdominal bruits. No CVA tenderness. Musculoskeletal: No lower extremity tenderness except mildly in the medial right ankle. There is a small amount of swelling there as well but no bruising. The patient's right wrist is nontender to palpation and has full range of motion there is no snuffbox tenderness. The patient's right shoulder also has full range of motion at her has no tenderness to palpation. Sensation and strength is normal in the hand and foot on the right side with good distal pulses` nor edema.  No joint effusions. Neurologic:  Normal speech and language. No gross focal neurologic deficits are appreciated.  Skin:  Skin is warm, dry and intact. No rash noted. Psychiatric: Mood and affect are normal. Speech and behavior are normal.  ____________________________________________   LABS (all labs ordered are listed, but only abnormal results are displayed)  Labs Reviewed - No data to display ____________________________________________  EKG   ____________________________________________  RADIOLOGY  X-rays of the ankle show no acute disease. CT of the head and neck also showed no acute disease ____________________________________________   PROCEDURES    ____________________________________________   INITIAL IMPRESSION / ASSESSMENT AND PLAN / ED COURSE  Pertinent labs & imaging results that were available during my care of the patient were reviewed by me and considered in my medical decision making (see chart for details).  Patient walking around the ER without any  difficulty. He does complain of pain in the ankle and is walking but is not walking with a lipase is walking with a shuffling gait. ____________________________________________   FINAL CLINICAL IMPRESSION(S) / ED DIAGNOSES  Final diagnoses:  Fall, initial encounter  Ankle pain, right      Nena Polio, MD 06/20/15 2118

## 2015-07-12 ENCOUNTER — Ambulatory Visit (INDEPENDENT_AMBULATORY_CARE_PROVIDER_SITE_OTHER): Payer: Medicare Other | Admitting: Cardiovascular Disease

## 2015-07-12 ENCOUNTER — Encounter: Payer: Self-pay | Admitting: Cardiovascular Disease

## 2015-07-12 VITALS — BP 153/81 | HR 75 | Ht 66.0 in | Wt 139.0 lb

## 2015-07-12 DIAGNOSIS — I482 Chronic atrial fibrillation, unspecified: Secondary | ICD-10-CM

## 2015-07-12 DIAGNOSIS — I10 Essential (primary) hypertension: Secondary | ICD-10-CM | POA: Diagnosis not present

## 2015-07-12 MED ORDER — ASPIRIN EC 81 MG PO TBEC: 81.0000 mg | DELAYED_RELEASE_TABLET | Freq: Every day | ORAL | Status: DC
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
## 2015-07-12 NOTE — Patient Instructions (Signed)
Medication Instructions:  Your physician has recommended you make the following change in your medication:  DECREASE aspirin to 81mg  once per day   Labwork: none  Testing/Procedures: none  Follow-Up: Your physician recommends that you schedule a follow-up appointment in: six months with Dr. Fletcher Anon.    Any Other Special Instructions Will Be Listed Below (If Applicable).

## 2015-07-14 NOTE — Assessment & Plan Note (Signed)
Blood pressure is mildly elevated today. Continue to monitor and consider increasing the dose of lisinopril.

## 2015-07-14 NOTE — Assessment & Plan Note (Signed)
Ventricular rate is well controlled on diltiazem. He continues to have frequent falls and thus he is not a good candidate for anticoagulation. He has been taking full dose aspirin and I decreased the dose to 81 mg.

## 2015-12-25 ENCOUNTER — Other Ambulatory Visit: Payer: Self-pay | Admitting: Cardiovascular Disease

## 2016-04-28 ENCOUNTER — Telehealth: Payer: Self-pay | Admitting: Cardiovascular Disease

## 2016-04-28 NOTE — Telephone Encounter (Signed)
lmov to schedule fu from recal list. 3rd attempt to schedule.  Deleting recall.

## 2017-01-11 ENCOUNTER — Other Ambulatory Visit: Payer: Self-pay | Admitting: Nurse Practitioner

## 2017-01-11 DIAGNOSIS — R1115 Cyclical vomiting syndrome unrelated to migraine: Secondary | ICD-10-CM

## 2017-01-11 DIAGNOSIS — R1310 Dysphagia, unspecified: Secondary | ICD-10-CM

## 2017-01-13 ENCOUNTER — Ambulatory Visit
Admission: RE | Admit: 2017-01-13 | Discharge: 2017-01-13 | Disposition: A | Discharge status: Home / Self Care | Payer: Medicare Other | Source: Ambulatory Visit | Attending: Nurse Practitioner | Admitting: Nurse Practitioner

## 2017-01-13 DIAGNOSIS — K2970 Gastritis, unspecified, without bleeding: Secondary | ICD-10-CM | POA: Insufficient documentation

## 2017-01-13 DIAGNOSIS — R1115 Cyclical vomiting syndrome unrelated to migraine: Secondary | ICD-10-CM

## 2017-01-13 DIAGNOSIS — K5710 Diverticulosis of small intestine without perforation or abscess without bleeding: Secondary | ICD-10-CM | POA: Insufficient documentation

## 2017-01-13 DIAGNOSIS — G43A1 Cyclical vomiting, intractable: Secondary | ICD-10-CM | POA: Insufficient documentation

## 2017-01-13 DIAGNOSIS — K219 Gastro-esophageal reflux disease without esophagitis: Secondary | ICD-10-CM | POA: Diagnosis not present

## 2017-01-13 DIAGNOSIS — K449 Diaphragmatic hernia without obstruction or gangrene: Secondary | ICD-10-CM | POA: Diagnosis not present

## 2017-01-13 DIAGNOSIS — R1310 Dysphagia, unspecified: Secondary | ICD-10-CM

## 2017-01-13 DIAGNOSIS — K224 Dyskinesia of esophagus: Secondary | ICD-10-CM | POA: Diagnosis not present

## 2017-01-13 DIAGNOSIS — R1319 Other dysphagia: Secondary | ICD-10-CM | POA: Insufficient documentation

## 2017-01-13 DIAGNOSIS — K2980 Duodenitis without bleeding: Secondary | ICD-10-CM | POA: Insufficient documentation

## 2017-03-16 ENCOUNTER — Emergency Department: Payer: Medicare Other

## 2017-03-16 ENCOUNTER — Emergency Department
Admission: EM | Admit: 2017-03-16 | Discharge: 2017-03-16 | Disposition: A | Discharge status: Home / Self Care | Payer: Medicare Other | Attending: Emergency Medicine | Admitting: Emergency Medicine

## 2017-03-16 DIAGNOSIS — F17210 Nicotine dependence, cigarettes, uncomplicated: Secondary | ICD-10-CM | POA: Insufficient documentation

## 2017-03-16 DIAGNOSIS — Y929 Unspecified place or not applicable: Secondary | ICD-10-CM | POA: Diagnosis not present

## 2017-03-16 DIAGNOSIS — W06XXXA Fall from bed, initial encounter: Secondary | ICD-10-CM | POA: Insufficient documentation

## 2017-03-16 DIAGNOSIS — Z79899 Other long term (current) drug therapy: Secondary | ICD-10-CM | POA: Diagnosis not present

## 2017-03-16 DIAGNOSIS — S51011A Laceration without foreign body of right elbow, initial encounter: Secondary | ICD-10-CM | POA: Insufficient documentation

## 2017-03-16 DIAGNOSIS — I10 Essential (primary) hypertension: Secondary | ICD-10-CM | POA: Diagnosis not present

## 2017-03-16 DIAGNOSIS — W19XXXA Unspecified fall, initial encounter: Secondary | ICD-10-CM

## 2017-03-16 DIAGNOSIS — S0990XA Unspecified injury of head, initial encounter: Secondary | ICD-10-CM | POA: Diagnosis present

## 2017-03-16 DIAGNOSIS — Y939 Activity, unspecified: Secondary | ICD-10-CM | POA: Insufficient documentation

## 2017-03-16 DIAGNOSIS — Z7982 Long term (current) use of aspirin: Secondary | ICD-10-CM | POA: Diagnosis not present

## 2017-03-16 DIAGNOSIS — S0081XA Abrasion of other part of head, initial encounter: Secondary | ICD-10-CM | POA: Diagnosis not present

## 2017-03-16 DIAGNOSIS — Z23 Encounter for immunization: Secondary | ICD-10-CM | POA: Diagnosis not present

## 2017-03-16 DIAGNOSIS — J449 Chronic obstructive pulmonary disease, unspecified: Secondary | ICD-10-CM | POA: Diagnosis not present

## 2017-03-16 DIAGNOSIS — Y999 Unspecified external cause status: Secondary | ICD-10-CM | POA: Insufficient documentation

## 2017-03-16 MED ORDER — LIDOCAINE-EPINEPHRINE-TETRACAINE (LET) SOLUTION: 3.0000 mL | Freq: Once | NASAL | Status: DC

## 2017-03-16 MED ORDER — TETANUS-DIPHTH-ACELL PERTUSSIS 5-2.5-18.5 LF-MCG/0.5 IM SUSP
0.5000 mL | Freq: Once | INTRAMUSCULAR | Status: AC
  Administered 2017-03-16: 0.5 mL via INTRAMUSCULAR
  Filled 2017-03-16: qty 0.5

## 2017-03-16 NOTE — ED Triage Notes (Signed)
Pt brought in [facility] from [facility], pt fell out of bed this AM and got self back up.  Pt c/o R shoulder and arm pain.  Pt has laceration above L eye and skin tear to R forearm.  Vitals WNL for EMS.  Pt has hx of dementia, but A&Ox4 upon arrival.  Pt is HOH. Pt in NAD upon arrival to ED.

## 2017-03-16 NOTE — Discharge Instructions (Addendum)
Please follow-up with your primary care physician in 2 days for a wound recheck. If you are unable to see her primary care physician please return to the emergency department for recheck.  It was a pleasure to take care of you today, and thank you for coming to our emergency department.  If you have any questions or concerns before leaving please ask the nurse to grab me and I'm more than happy to go through your aftercare instructions again.  If you were prescribed any opioid pain medication today such as Norco, Vicodin, Percocet, morphine, hydrocodone, or oxycodone please make sure you do not drive when you are taking this medication as it can alter your ability to drive safely.  If you have any concerns once you are home that you are not improving or are in fact getting worse before you can make it to your follow-up appointment, please do not hesitate to call 911 and come back for further evaluation.  Darel Hong MD  Results for orders placed or performed during the hospital encounter of 03/13/15  MRSA PCR Screening  Result Value Ref Range   MRSA by PCR NEGATIVE NEGATIVE  CBC  Result Value Ref Range   WBC 7.2 3.8 - 10.6 K/uL   RBC 5.10 4.40 - 5.90 MIL/uL   Hemoglobin 16.6 13.0 - 18.0 g/dL   HCT 48.6 40.0 - 52.0 %   MCV 95.2 80.0 - 100.0 fL   MCH 32.6 26.0 - 34.0 pg   MCHC 34.2 32.0 - 36.0 g/dL   RDW 13.1 11.5 - 14.5 %   Platelets 131 (L) 150 - 440 K/uL  Basic metabolic panel  Result Value Ref Range   Sodium 140 135 - 145 mmol/L   Potassium 3.8 3.5 - 5.1 mmol/L   Chloride 104 101 - 111 mmol/L   CO2 28 22 - 32 mmol/L   Glucose, Bld 122 (H) 65 - 99 mg/dL   BUN 22 (H) 6 - 20 mg/dL   Creatinine, Ser 1.50 (H) 0.61 - 1.24 mg/dL   Calcium 9.2 8.9 - 10.3 mg/dL   GFR calc non Af Amer 44 (L) >60 mL/min   GFR calc Af Amer 51 (L) >60 mL/min   Anion gap 8 5 - 15  Troponin I  Result Value Ref Range   Troponin I <0.03 <0.031 ng/mL  Blood gas, arterial  Result Value Ref Range   FIO2  0.45 %   Delivery systems NASAL CANNULA    pH, Arterial 7.39 7.350 - 7.450   pCO2 arterial 42 32.0 - 48.0 mmHg   pO2, Arterial 169 (H) 83.0 - 108.0 mmHg   Bicarbonate 25.4 21.0 - 28.0 mEq/L   Acid-Base Excess 0.3 0.0 - 3.0 mmol/L   O2 Saturation 99.5 %   Patient temperature 37.0    Collection site RIGHT RADIAL    Sample type ARTERIAL DRAW    Allens test (pass/fail) PASS PASS  Glucose, capillary  Result Value Ref Range   Glucose-Capillary 225 (H) 65 - 99 mg/dL  CBC  Result Value Ref Range   WBC 7.8 3.8 - 10.6 K/uL   RBC 4.60 4.40 - 5.90 MIL/uL   Hemoglobin 15.3 13.0 - 18.0 g/dL   HCT 43.5 40.0 - 52.0 %   MCV 94.7 80.0 - 100.0 fL   MCH 33.3 26.0 - 34.0 pg   MCHC 35.1 32.0 - 36.0 g/dL   RDW 13.2 11.5 - 14.5 %   Platelets 122 (L) 150 - 440 K/uL  Basic metabolic panel  Result Value Ref Range   Sodium 140 135 - 145 mmol/L   Potassium 3.4 (L) 3.5 - 5.1 mmol/L   Chloride 102 101 - 111 mmol/L   CO2 28 22 - 32 mmol/L   Glucose, Bld 177 (H) 65 - 99 mg/dL   BUN 27 (H) 6 - 20 mg/dL   Creatinine, Ser 1.46 (H) 0.61 - 1.24 mg/dL   Calcium 9.1 8.9 - 10.3 mg/dL   GFR calc non Af Amer 46 (L) >60 mL/min   GFR calc Af Amer 53 (L) >60 mL/min   Anion gap 10 5 - 15  Magnesium  Result Value Ref Range   Magnesium 1.9 1.7 - 2.4 mg/dL  Phosphorus  Result Value Ref Range   Phosphorus 2.7 2.5 - 4.6 mg/dL  Phenytoin level, total  Result Value Ref Range   Phenytoin Lvl 3.3 (L) 10.0 - 20.0 ug/mL  Platelet count  Result Value Ref Range   Platelets 128 (L) 150 - 440 K/uL  Albumin  Result Value Ref Range   Albumin 3.5 3.5 - 5.0 g/dL   Dg Shoulder Right  Result Date: 03/16/2017 CLINICAL DATA:  Status post fall out of bed, with right shoulder and arm pain. Initial encounter. EXAM: RIGHT SHOULDER - 2+ VIEW COMPARISON:  None. FINDINGS: There is no evidence of fracture or dislocation. The right humeral head is seated within the glenoid fossa. The acromioclavicular joint is unremarkable in appearance.  No significant soft tissue abnormalities are seen. The visualized portions of the right lung are clear. IMPRESSION: No evidence of fracture or dislocation. Electronically Signed   By: Garald Balding M.D.   On: 03/16/2017 05:47   Ct Head Wo Contrast  Result Date: 03/16/2017 CLINICAL DATA:  Fall from bed this morning. LEFT supraorbital laceration. History of atrial fibrillation, hypertension, stroke. EXAM: CT HEAD WITHOUT CONTRAST TECHNIQUE: Contiguous axial images were obtained from the base of the skull through the vertex without intravenous contrast. COMPARISON:  CT HEAD June 20, 2015 FINDINGS: BRAIN: No intraparenchymal hemorrhage, mass effect nor midline shift. Old bilateral cerebellar infarcts. Old RIGHT basal ganglia infarct with ex vacuo dilatation RIGHT lateral ventricle. No hydrocephalus. RIGHT cerebral peduncle volume loss consistent with wallerian degeneration. Moderate white matter changes exclusive a aforementioned abnormality consistent with chronic small vessel ischemic disease. No abnormal extra-axial fluid collections. Basal cisterns are patent. VASCULAR: Severe calcific atherosclerosis of the carotid siphons. SKULL: No skull fracture. No significant scalp soft tissue swelling. SINUSES/ORBITS: The mastoid air-cells and included paranasal sinuses are well-aerated.The included ocular globes and orbital contents are non-suspicious. OTHER: Scattered scalp tricholemmal cysts. IMPRESSION: No acute intracranial process. Old RIGHT basal ganglia and cerebellar infarcts. Moderate chronic small vessel ischemic disease. Electronically Signed   By: Elon Alas M.D.   On: 03/16/2017 05:36
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
## 2017-03-16 NOTE — ED Notes (Signed)
Pt assisted to the toilet. Pt used the bathroom and was assisted back to his bed w/o incident.

## 2017-06-07 ENCOUNTER — Encounter: Payer: Self-pay | Admitting: Emergency Medicine

## 2017-06-07 ENCOUNTER — Emergency Department: Payer: Medicare Other

## 2017-06-07 ENCOUNTER — Emergency Department
Admission: EM | Admit: 2017-06-07 | Discharge: 2017-06-07 | Disposition: A | Discharge status: Skilled Nursing Facility | Payer: Medicare Other | Source: Home / Self Care | Attending: Emergency Medicine | Admitting: Emergency Medicine

## 2017-06-07 DIAGNOSIS — W19XXXA Unspecified fall, initial encounter: Secondary | ICD-10-CM

## 2017-06-07 DIAGNOSIS — Y93E8 Activity, other personal hygiene: Secondary | ICD-10-CM | POA: Insufficient documentation

## 2017-06-07 DIAGNOSIS — Z7982 Long term (current) use of aspirin: Secondary | ICD-10-CM | POA: Insufficient documentation

## 2017-06-07 DIAGNOSIS — I129 Hypertensive chronic kidney disease with stage 1 through stage 4 chronic kidney disease, or unspecified chronic kidney disease: Secondary | ICD-10-CM

## 2017-06-07 DIAGNOSIS — J449 Chronic obstructive pulmonary disease, unspecified: Secondary | ICD-10-CM | POA: Insufficient documentation

## 2017-06-07 DIAGNOSIS — Y999 Unspecified external cause status: Secondary | ICD-10-CM | POA: Insufficient documentation

## 2017-06-07 DIAGNOSIS — N183 Chronic kidney disease, stage 3 (moderate): Secondary | ICD-10-CM

## 2017-06-07 DIAGNOSIS — W1830XA Fall on same level, unspecified, initial encounter: Secondary | ICD-10-CM

## 2017-06-07 DIAGNOSIS — F17210 Nicotine dependence, cigarettes, uncomplicated: Secondary | ICD-10-CM

## 2017-06-07 DIAGNOSIS — Y929 Unspecified place or not applicable: Secondary | ICD-10-CM | POA: Insufficient documentation

## 2017-06-07 DIAGNOSIS — S0990XA Unspecified injury of head, initial encounter: Secondary | ICD-10-CM | POA: Insufficient documentation

## 2017-06-07 NOTE — ED Provider Notes (Signed)
[facility] Emergency Department Provider Note  ____________________________________________   First MD Initiated Contact with Patient 06/07/17 1803     (approximate)  I have reviewed the triage vital signs and the nursing notes.   HISTORY  Chief Complaint Fall   HPI EMMITTE SURGEON is a 76 y.o. male who is presenting after an unwitnessed fall. He says that he was trying to pull up his pants when he slipped and fell backwards hitting his head against the nightstand. He denies any pain to the back of his head or loss of consciousness. Denies any pain in his neckas well. Denies any palpitations, chest pain, shortness of breath or lightheadedness.   Past Medical History:  Diagnosis Date  . Benign prostatic hypertrophy   . Broken arm    left  . Chronic atrial fibrillation ([facility])    a. not on long term anticoagulation 2/2 high risk fall and multiple falls  . Chronic kidney disease    a. stage III; b. baseline SCr approx 1.50  . COPD (chronic obstructive pulmonary disease) ([place])   . Esophageal reflux   . Hyperlipidemia   . Hypertension   . 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 Mitral regurgitation    a. echo 2013: EF 65%, mod MR/TR, LA 4.4 cm, mod dilated RA  . 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 PVD (peripheral vascular disease) ([place])   . Seasonal allergies   . Seizures ([place])    a. at time of last stroke  . Stroke [facility])    a. x2, last approx 2011  . Syncope   . Unresponsiveness 02/08/2012    Patient Active Problem List   Diagnosis Date Noted  . Chronic atrial fibrillation ([place]) 04/11/2015  . Acute on chronic respiratory failure with hypoxemia ([place]) 03/13/2015  . Atrial fibrillation with RVR ([place])   . COPD with acute exacerbation ([place])   . Hypertension   . Preop cardiovascular exam 10/21/2012  . SSS (sick sinus syndrome) ([place]) 03/29/2012  . Syncope 03/29/2012  . Dizziness 03/29/2012  . Bilateral inguinal hernia  06/11/2011    Past Surgical History:  Procedure Laterality Date  . COLONOSCOPY  2008   polyps  . ESOPHAGOGASTRODUODENOSCOPY N/A 02/21/2015   Procedure: ESOPHAGOGASTRODUODENOSCOPY (EGD);  Surgeon: Lucilla Lame, MD;  Location: [place];  Service: Gastroenterology;  Laterality: N/A;  . FRACTURE SURGERY     ankle  . HERNIA REPAIR Bilateral 07-28-2013   inguinal  . UPPER GASTROINTESTINAL ENDOSCOPY  2008    Prior to Admission medications   Medication Sig Start Date End Date Taking? Authorizing Provider  acetaminophen (TYLENOL) 325 MG tablet Take 650 mg by mouth every 4 (four) hours as needed for pain or fever.     [provider]  albuterol (PROVENTIL HFA;VENTOLIN HFA) 108 (90 BASE) MCG/ACT inhaler Inhale 1 puff into the lungs every 6 (six) hours as needed for wheezing or shortness of breath.    [provider]  ASPIRIN LOW DOSE 81 MG EC tablet TAKE 1 TABLET BY MOUTH ONCE DAILY 12/25/15   Wellington Hampshire, MD  atorvastatin (LIPITOR) 20 MG tablet Take 20 mg by mouth daily. 9 PM    [provider]  Cholecalciferol (VITAMIN D-3) 1000 UNITS CAPS Take 2 capsules by mouth daily. 9 AM    [provider]  diltiazem (CARDIZEM CD) 120 MG 24 hr capsule Take 1 capsule (120 mg total) by mouth daily.  03/15/15   Henreitta Leber, MD  finasteride (PROSCAR) 5 MG tablet Take 5 mg by mouth daily. 9 am    [provider]  FLUoxetine (PROZAC) 20 MG tablet Take 20 mg by mouth daily. 9 AM    [provider]  Fluticasone-Salmeterol (ADVAIR) 250-50 MCG/DOSE AEPB Inhale 1 puff into the lungs daily.    [provider]  gabapentin (NEURONTIN) 100 MG capsule Take 1 capsule by mouth at bedtime. 9 AM 06/12/13   [provider]  lisinopril (PRINIVIL,ZESTRIL) 20 MG tablet Take 1 tablet (20 mg total) by mouth daily. 05/14/14   Wellington Hampshire, MD  Melatonin 5 MG TABS Take by mouth daily. 8 PM    [provider]  phenytoin (DILANTIN) 100 MG  ER capsule Take 100 mg by mouth daily. 9 AM    [provider]  PHENYTOIN INFATABS 50 MG tablet 50 mg once. 9 AM 06/12/13   [provider]  polyethylene glycol (MIRALAX / GLYCOLAX) packet Take 17 g by mouth daily. 9 AM    [provider]  solifenacin (VESICARE) 10 MG tablet Take 10 mg by mouth daily. 9 AM    [provider]  tamsulosin (FLOMAX) 0.4 MG CAPS Take 0.4 mg by mouth daily. 9 AM    [provider]  tiotropium (SPIRIVA) 18 MCG inhalation capsule Place 1 capsule (18 mcg total) into inhaler and inhale daily. 03/15/15   Henreitta Leber, MD    Allergies Penicillins  Family History  Problem Relation Age of Onset  . Diabetes Mellitus II Mother     Social History Social History  Substance Use Topics  . Smoking status: Current Every Day Smoker    Packs/day: 0.25    Years: 50.00    Types: Cigars, Cigarettes    Last attempt to quit: 08/30/2010  . Smokeless tobacco: Not on file  . Alcohol use No    Review of Systems  Constitutional: No fever/chills Eyes: No visual changes. ENT: No sore throat. Cardiovascular: Denies chest pain. Respiratory: Denies shortness of breath. Gastrointestinal: No abdominal pain.  No nausea, no vomiting.  No diarrhea.  No constipation. Genitourinary: Negative for dysuria. Musculoskeletal: Negative for back pain. Skin: Negative for rash. Neurological: Negative for headaches, focal weakness or numbness.   ____________________________________________   PHYSICAL EXAM:  VITAL SIGNS: ED Triage Vitals  Enc Vitals Group     BP 06/07/17 1809 (!) 150/67     Pulse --      Resp 06/07/17 1809 20     Temp 06/07/17 1809 98.5 F (36.9 C)     Temp Source 06/07/17 1809 Oral     SpO2 06/07/17 1809 96 %     Weight 06/07/17 1805 140 lb (63.5 kg)     Height --      Head Circumference --      Peak Flow --      Pain Score 06/07/17 1804 6     Pain Loc --      Pain Edu? --      Excl. in [place]? --      Constitutional: Alert and oriented. Well appearing and in no acute distress. Eyes: Conjunctivae are normal.  Head: 2 x 3 cm occipital hematoma without any overlying laceration or abrasion.. Nose: No congestion/rhinnorhea. Mouth/Throat: Mucous membranes are moist.  Neck: No stridor.  No tenderness palpation to the midline cervical spine. Cardiovascular: Normal rate, irregularly irregular rhythm. Grossly normal heart sounds.   Respiratory: Normal respiratory effort.  No  retractions. Lungs CTAB. Gastrointestinal: Soft and nontender. No distention. No CVA tenderness. Musculoskeletal: No lower extremity tenderness nor edema.  No joint effusions. No tenderness along the chest wall or abdomen. No tenderness to the bilateral hips. 5 out of 5 strength bilateral lower extremities without any shortening or deformity. Full range of motion active motion to the bilateral lower extremities. Neurologic:  Normal speech and language. No gross focal neurologic deficits are appreciated. Skin:  Skin is warm, dry and intact. No rash noted. Psychiatric: Mood and affect are normal. Speech and behavior are normal.  ____________________________________________   LABS (all labs ordered are listed, but only abnormal results are displayed)  Labs Reviewed - No data to display ____________________________________________  EKG  ED ECG REPORT I, [place],  Youlanda Roys, the attending physician, personally viewed and interpreted this ECG.   Date: 06/07/2017  EKG Time: 1808  Rate: 79  Rhythm: atrial fibrillation, rate 79  Axis: Normal  Intervals:none  ST&T Change: No ST segment elevation or depression. No abnormal T-wave inversion. EKG machine reads minimal ST depression in the lateral leads but I believe this is due to the patient's wandering baseline.  ____________________________________________  RADIOLOGY  No acute findings on the CT of the  brain. ____________________________________________   PROCEDURES  Procedure(s) performed:   Procedures  Critical Care performed:   ____________________________________________   INITIAL IMPRESSION / ASSESSMENT AND PLAN / ED COURSE  Pertinent labs & imaging results that were available during my care of the patient were reviewed by me and considered in my medical decision making (see chart for details).  ----------------------------------------- 6:51 PM on 06/07/2017 -----------------------------------------  Patient's son is at the bedside and says that the patient is at his baseline mental status. We discussed workup including further studies of blood and urine but the patient is able to recall the events of the fall. He is denying any palpitations, chest pain or lightheadedness. Both myself and the son agree that it is reasonable for imaging only. The patient does not have any further complaints. Reassuring head CT. The patient will be discharged home. The patient as well as the son understanding of this plan and willing to comply. Likely mechanical fall.      ____________________________________________   FINAL CLINICAL IMPRESSION(S) / ED DIAGNOSES  Fall. Head injury.    NEW MEDICATIONS STARTED DURING THIS VISIT:  New Prescriptions   No medications on file     Note:  This document was prepared using Dragon voice recognition software and may include unintentional dictation errors.     Orbie Pyo, MD 06/07/17 [phone]

## 2017-06-07 NOTE — ED Triage Notes (Signed)
Pt arrived via EMS from [facility] for reports of unwitnessed fall. EMS reports fell in his room and hit his head on the nightstand. Pt denies dizziness prior to fall and states he remembers falling, states he got up as soon as he fell. Pt alert andoriented to self and place but disoriented to date and year. EMS reports 161/76, CBG 147.

## 2017-06-07 NOTE — ED Notes (Addendum)
Pts Son, Vue Pavon, given discharge paperwork as well as discharge education. Son to take pt back to [facility].

## 2017-06-07 NOTE — ED Notes (Signed)
Patient transported to CT 

## 2017-06-08 ENCOUNTER — Emergency Department: Payer: Medicare Other

## 2017-06-08 ENCOUNTER — Inpatient Hospital Stay
Admission: EM | Admit: 2017-06-08 | Discharge: 2017-06-14 | DRG: 871 | Disposition: A | Discharge status: Skilled Nursing Facility | Payer: Medicare Other | Attending: Internal Medicine | Admitting: Internal Medicine

## 2017-06-08 ENCOUNTER — Encounter: Payer: Self-pay | Admitting: Emergency Medicine

## 2017-06-08 DIAGNOSIS — E079 Disorder of thyroid, unspecified: Secondary | ICD-10-CM

## 2017-06-08 DIAGNOSIS — W19XXXA Unspecified fall, initial encounter: Secondary | ICD-10-CM | POA: Diagnosis not present

## 2017-06-08 DIAGNOSIS — F0280 Dementia in other diseases classified elsewhere without behavioral disturbance: Secondary | ICD-10-CM | POA: Diagnosis present

## 2017-06-08 DIAGNOSIS — R569 Unspecified convulsions: Secondary | ICD-10-CM | POA: Diagnosis present

## 2017-06-08 DIAGNOSIS — I482 Chronic atrial fibrillation: Secondary | ICD-10-CM | POA: Diagnosis present

## 2017-06-08 DIAGNOSIS — E785 Hyperlipidemia, unspecified: Secondary | ICD-10-CM | POA: Diagnosis present

## 2017-06-08 DIAGNOSIS — S0093XA Contusion of unspecified part of head, initial encounter: Secondary | ICD-10-CM | POA: Diagnosis present

## 2017-06-08 DIAGNOSIS — J440 Chronic obstructive pulmonary disease with acute lower respiratory infection: Secondary | ICD-10-CM | POA: Diagnosis present

## 2017-06-08 DIAGNOSIS — Z833 Family history of diabetes mellitus: Secondary | ICD-10-CM

## 2017-06-08 DIAGNOSIS — I739 Peripheral vascular disease, unspecified: Secondary | ICD-10-CM | POA: Diagnosis present

## 2017-06-08 DIAGNOSIS — D72829 Elevated white blood cell count, unspecified: Secondary | ICD-10-CM | POA: Diagnosis not present

## 2017-06-08 DIAGNOSIS — I340 Nonrheumatic mitral (valve) insufficiency: Secondary | ICD-10-CM | POA: Diagnosis not present

## 2017-06-08 DIAGNOSIS — M549 Dorsalgia, unspecified: Secondary | ICD-10-CM

## 2017-06-08 DIAGNOSIS — Z515 Encounter for palliative care: Secondary | ICD-10-CM | POA: Diagnosis not present

## 2017-06-08 DIAGNOSIS — R414 Neurologic neglect syndrome: Secondary | ICD-10-CM | POA: Diagnosis present

## 2017-06-08 DIAGNOSIS — I480 Paroxysmal atrial fibrillation: Secondary | ICD-10-CM | POA: Diagnosis present

## 2017-06-08 DIAGNOSIS — Z8249 Family history of ischemic heart disease and other diseases of the circulatory system: Secondary | ICD-10-CM | POA: Diagnosis not present

## 2017-06-08 DIAGNOSIS — W1830XA Fall on same level, unspecified, initial encounter: Secondary | ICD-10-CM | POA: Diagnosis present

## 2017-06-08 DIAGNOSIS — R4182 Altered mental status, unspecified: Secondary | ICD-10-CM

## 2017-06-08 DIAGNOSIS — J449 Chronic obstructive pulmonary disease, unspecified: Secondary | ICD-10-CM | POA: Diagnosis not present

## 2017-06-08 DIAGNOSIS — C7800 Secondary malignant neoplasm of unspecified lung: Secondary | ICD-10-CM | POA: Diagnosis present

## 2017-06-08 DIAGNOSIS — I4891 Unspecified atrial fibrillation: Secondary | ICD-10-CM | POA: Diagnosis not present

## 2017-06-08 DIAGNOSIS — R221 Localized swelling, mass and lump, neck: Secondary | ICD-10-CM | POA: Diagnosis not present

## 2017-06-08 DIAGNOSIS — N179 Acute kidney failure, unspecified: Secondary | ICD-10-CM | POA: Diagnosis present

## 2017-06-08 DIAGNOSIS — J189 Pneumonia, unspecified organism: Secondary | ICD-10-CM | POA: Diagnosis present

## 2017-06-08 DIAGNOSIS — Z7189 Other specified counseling: Secondary | ICD-10-CM

## 2017-06-08 DIAGNOSIS — R64 Cachexia: Secondary | ICD-10-CM | POA: Diagnosis present

## 2017-06-08 DIAGNOSIS — I081 Rheumatic disorders of both mitral and tricuspid valves: Secondary | ICD-10-CM | POA: Diagnosis present

## 2017-06-08 DIAGNOSIS — R918 Other nonspecific abnormal finding of lung field: Secondary | ICD-10-CM

## 2017-06-08 DIAGNOSIS — K219 Gastro-esophageal reflux disease without esophagitis: Secondary | ICD-10-CM | POA: Diagnosis present

## 2017-06-08 DIAGNOSIS — I129 Hypertensive chronic kidney disease with stage 1 through stage 4 chronic kidney disease, or unspecified chronic kidney disease: Secondary | ICD-10-CM | POA: Diagnosis present

## 2017-06-08 DIAGNOSIS — A419 Sepsis, unspecified organism: Secondary | ICD-10-CM | POA: Diagnosis present

## 2017-06-08 DIAGNOSIS — C73 Malignant neoplasm of thyroid gland: Secondary | ICD-10-CM | POA: Diagnosis present

## 2017-06-08 DIAGNOSIS — Z7982 Long term (current) use of aspirin: Secondary | ICD-10-CM | POA: Diagnosis not present

## 2017-06-08 DIAGNOSIS — R0602 Shortness of breath: Secondary | ICD-10-CM | POA: Diagnosis not present

## 2017-06-08 DIAGNOSIS — N183 Chronic kidney disease, stage 3 (moderate): Secondary | ICD-10-CM | POA: Diagnosis present

## 2017-06-08 DIAGNOSIS — Z880 Allergy status to penicillin: Secondary | ICD-10-CM

## 2017-06-08 DIAGNOSIS — Z6822 Body mass index (BMI) 22.0-22.9, adult: Secondary | ICD-10-CM

## 2017-06-08 DIAGNOSIS — Z66 Do not resuscitate: Secondary | ICD-10-CM | POA: Diagnosis present

## 2017-06-08 DIAGNOSIS — M545 Low back pain: Secondary | ICD-10-CM | POA: Diagnosis not present

## 2017-06-08 DIAGNOSIS — R410 Disorientation, unspecified: Secondary | ICD-10-CM | POA: Diagnosis not present

## 2017-06-08 DIAGNOSIS — F17210 Nicotine dependence, cigarettes, uncomplicated: Secondary | ICD-10-CM | POA: Diagnosis present

## 2017-06-08 DIAGNOSIS — I361 Nonrheumatic tricuspid (valve) insufficiency: Secondary | ICD-10-CM | POA: Diagnosis not present

## 2017-06-08 DIAGNOSIS — Z8673 Personal history of transient ischemic attack (TIA), and cerebral infarction without residual deficits: Secondary | ICD-10-CM

## 2017-06-08 DIAGNOSIS — N400 Enlarged prostate without lower urinary tract symptoms: Secondary | ICD-10-CM | POA: Diagnosis present

## 2017-06-08 DIAGNOSIS — M5136 Other intervertebral disc degeneration, lumbar region: Secondary | ICD-10-CM | POA: Diagnosis present

## 2017-06-08 LAB — CBC WITH DIFFERENTIAL/PLATELET
BASOS ABS: 0.1 10*3/uL (ref 0–0.1)
Basophils Relative: 0 %
EOS PCT: 2 %
Eosinophils Absolute: 0.5 10*3/uL (ref 0–0.7)
HCT: 36.1 % — ABNORMAL LOW (ref 40.0–52.0)
Hemoglobin: 12.4 g/dL — ABNORMAL LOW (ref 13.0–18.0)
Lymphocytes Relative: 5 %
Lymphs Abs: 1.4 10*3/uL (ref 1.0–3.6)
MCH: 30.4 pg (ref 26.0–34.0)
MCHC: 34.4 g/dL (ref 32.0–36.0)
MCV: 88.4 fL (ref 80.0–100.0)
MONO ABS: 1.6 10*3/uL — AB (ref 0.2–1.0)
Monocytes Relative: 5 %
Neutro Abs: 26.4 10*3/uL — ABNORMAL HIGH (ref 1.4–6.5)
Neutrophils Relative %: 88 %
PLATELETS: 255 10*3/uL (ref 150–440)
RBC: 4.08 MIL/uL — ABNORMAL LOW (ref 4.40–5.90)
RDW: 12.9 % (ref 11.5–14.5)
WBC: 30 10*3/uL — ABNORMAL HIGH (ref 3.8–10.6)

## 2017-06-08 LAB — BASIC METABOLIC PANEL
ANION GAP: 9 (ref 5–15)
BUN: 22 mg/dL — ABNORMAL HIGH (ref 6–20)
CALCIUM: 10 mg/dL (ref 8.9–10.3)
CO2: 26 mmol/L (ref 22–32)
CREATININE: 1.61 mg/dL — AB (ref 0.61–1.24)
Chloride: 101 mmol/L (ref 101–111)
GFR, EST AFRICAN AMERICAN: 46 mL/min — AB (ref >60)
GFR, EST NON AFRICAN AMERICAN: 40 mL/min — AB (ref >60)
GLUCOSE: 132 mg/dL — AB (ref 65–99)
Potassium: 3.7 mmol/L (ref 3.5–5.1)
Sodium: 136 mmol/L (ref 135–145)

## 2017-06-08 LAB — URINALYSIS, COMPLETE (UACMP) WITH MICROSCOPIC
BACTERIA UA: NONE SEEN
Bilirubin Urine: NEGATIVE
Glucose, UA: 50 mg/dL — AB
HGB URINE DIPSTICK: NEGATIVE
Ketones, ur: 5 mg/dL — AB
Leukocytes, UA: NEGATIVE
NITRITE: NEGATIVE
Protein, ur: 100 mg/dL — AB
SPECIFIC GRAVITY, URINE: 1.023 (ref 1.005–1.030)
SQUAMOUS EPITHELIAL / LPF: NONE SEEN
pH: 5 (ref 5.0–8.0)

## 2017-06-08 LAB — TROPONIN I: Troponin I: <0.03 ng/mL (ref <0.03)

## 2017-06-08 MED ORDER — LEVOFLOXACIN IN D5W 750 MG/150ML IV SOLN
750.0000 mg | Freq: Once | INTRAVENOUS | Status: AC
  Administered 2017-06-08: 750 mg via INTRAVENOUS
  Filled 2017-06-08: qty 150

## 2017-06-08 MED ORDER — IOPAMIDOL (ISOVUE-300) INJECTION 61%
60.0000 mL | Freq: Once | INTRAVENOUS | Status: AC | PRN
  Administered 2017-06-08: 60 mL via INTRAVENOUS

## 2017-06-08 NOTE — ED Provider Notes (Signed)
[facility] Emergency Department Provider Note   ____________________________________________   First MD Initiated Contact with Patient 06/08/17 1854     (approximate)  I have reviewed the triage vital signs and the nursing notes.   HISTORY  Chief Complaint Fall    HPI Ian Townsend is a 76 y.o. male who reports his walker got ahead of him and he fell. He says he hit the back of his head. He complains of pain in the back of his head and also between his shoulders. He has a little bit of a cough as well. He denies any fever or any other injury.   Past Medical History:  Diagnosis Date  . Benign prostatic hypertrophy   . Broken arm    left  . Chronic atrial fibrillation (HCC)    a. not on long term anticoagulation 2/2 high risk fall and multiple falls  . Chronic kidney disease    a. stage III; b. baseline SCr approx 1.50  . COPD (chronic obstructive pulmonary disease) ([place])   . Esophageal reflux   . Hyperlipidemia   . Hypertension   . 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 Mitral regurgitation    a. echo 2013: EF 65%, mod MR/TR, LA 4.4 cm, mod dilated RA  . 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 PVD (peripheral vascular disease) ([place])   . Seasonal allergies   . Seizures ([place])    a. at time of last stroke  . Stroke [facility])    a. x2, last approx 2011  . Syncope   . Unresponsiveness 02/08/2012    Patient Active Problem List   Diagnosis Date Noted  . Chronic atrial fibrillation ([place]) 04/11/2015  . Acute on chronic respiratory failure with hypoxemia ([place]) 03/13/2015  . Atrial fibrillation with RVR ([place])   . COPD with acute exacerbation ([place])   . Hypertension   . Preop cardiovascular exam 10/21/2012  . SSS (sick sinus syndrome) ([place]) 03/29/2012  . Syncope 03/29/2012  . Dizziness 03/29/2012  . Bilateral inguinal hernia 06/11/2011    Past Surgical History:  Procedure Laterality Date  . COLONOSCOPY  2008   polyps  .  ESOPHAGOGASTRODUODENOSCOPY N/A 02/21/2015   Procedure: ESOPHAGOGASTRODUODENOSCOPY (EGD);  Surgeon: Lucilla Lame, MD;  Location: [place];  Service: Gastroenterology;  Laterality: N/A;  . FRACTURE SURGERY     ankle  . HERNIA REPAIR Bilateral 07-28-2013   inguinal  . UPPER GASTROINTESTINAL ENDOSCOPY  2008    Prior to Admission medications   Medication Sig Start Date End Date Taking? Authorizing Provider  acetaminophen (TYLENOL) 325 MG tablet Take 650 mg by mouth every 4 (four) hours as needed for pain or fever.     [provider]  albuterol (PROVENTIL HFA;VENTOLIN HFA) 108 (90 BASE) MCG/ACT inhaler Inhale 1 puff into the lungs every 6 (six) hours as needed for wheezing or shortness of breath.    [provider]  ASPIRIN LOW DOSE 81 MG EC tablet TAKE 1 TABLET BY MOUTH ONCE DAILY 12/25/15   Wellington Hampshire, MD  atorvastatin (LIPITOR) 20 MG tablet Take 20 mg by mouth daily. 9 PM    [provider]  Cholecalciferol (VITAMIN D-3) 1000 UNITS CAPS Take 2 capsules by mouth daily. 9 AM    [provider]  diltiazem (CARDIZEM CD) 120 MG 24 hr capsule Take 1 capsule (120 mg total) by mouth daily. 03/15/15   Henreitta Leber,  MD  finasteride (PROSCAR) 5 MG tablet Take 5 mg by mouth daily. 9 am    [provider]  FLUoxetine (PROZAC) 20 MG tablet Take 20 mg by mouth daily. 9 AM    [provider]  Fluticasone-Salmeterol (ADVAIR) 250-50 MCG/DOSE AEPB Inhale 1 puff into the lungs daily.    [provider]  gabapentin (NEURONTIN) 100 MG capsule Take 1 capsule by mouth at bedtime. 9 AM 06/12/13   [provider]  lisinopril (PRINIVIL,ZESTRIL) 20 MG tablet Take 1 tablet (20 mg total) by mouth daily. 05/14/14   Wellington Hampshire, MD  Melatonin 5 MG TABS Take by mouth daily. 8 PM    [provider]  phenytoin (DILANTIN) 100 MG ER capsule Take 100 mg by mouth daily. 9 AM    [provider]  PHENYTOIN INFATABS 50 MG  tablet 50 mg once. 9 AM 06/12/13   [provider]  polyethylene glycol (MIRALAX / GLYCOLAX) packet Take 17 g by mouth daily. 9 AM    [provider]  solifenacin (VESICARE) 10 MG tablet Take 10 mg by mouth daily. 9 AM    [provider]  tamsulosin (FLOMAX) 0.4 MG CAPS Take 0.4 mg by mouth daily. 9 AM    [provider]  tiotropium (SPIRIVA) 18 MCG inhalation capsule Place 1 capsule (18 mcg total) into inhaler and inhale daily. 03/15/15   Henreitta Leber, MD    Allergies Penicillins  Family History  Problem Relation Age of Onset  . Diabetes Mellitus II Mother     Social History Social History  Substance Use Topics  . Smoking status: Current Every Day Smoker    Packs/day: 0.25    Years: 50.00    Types: Cigars, Cigarettes    Last attempt to quit: 08/30/2010  . Smokeless tobacco: Never Used  . Alcohol use No    Review of Systems  Constitutional: No fever/chills Eyes: No visual changes. ENT: No sore throat. Cardiovascular: Denies chest pain. Respiratory: Denies shortness of breath. Gastrointestinal: No abdominal pain.  No nausea, no vomiting.  No diarrhea.  No constipation. Genitourinary: Negative for dysuria. Musculoskeletal: Negative for back pain. Skin: Negative for rash. Neurological: Negative for headaches, focal weakness   ____________________________________________   PHYSICAL EXAM:  VITAL SIGNS: ED Triage Vitals  Enc Vitals Group     BP 06/08/17 1836 (!) 181/73     Pulse Rate 06/08/17 1836 94     Resp 06/08/17 1836 18     Temp 06/08/17 1836 99 F (37.2 C)     Temp Source 06/08/17 1836 Oral     SpO2 06/08/17 1836 94 %     Weight 06/08/17 1838 140 lb (63.5 kg)     Height 06/08/17 1838 5\' 6"  (1.676 m)     Head Circumference --      Peak Flow --      Pain Score 06/08/17 1835 6     Pain Loc --      Pain Edu? --      Excl. in [place]? --     Constitutional: Alert and oriented. Well appearing and in no acute distress. Eyes:  Conjunctivae are normal. PERRL. EOMI. Head: Atraumatic. Nose: No congestion/rhinnorhea. Mouth/Throat: Mucous membranes are moist.  Oropharynx non-erythematous. Neck: No stridor.  Cardiovascular: Normal rate, regular rhythm. Grossly normal heart sounds.  Good peripheral circulation. Respiratory: Normal respiratory effort.  No retractions. Lungs CTAB. Gastrointestinal: Soft and nontender. No distention. No abdominal bruits. No CVA tenderness. }Musculoskeletal: No lower  extremity tenderness nor edema.  No joint effusions. Neurologic:  Normal speech and language. No new gross focal neurologic deficits are appreciated.  Skin:  Skin is warm, dry and intact. No rash noted. Psychiatric: Mood and affect are normal. Speech and behavior are normal.  ____________________________________________   LABS (all labs ordered are listed, but only abnormal results are displayed)  Labs Reviewed  CBC WITH DIFFERENTIAL/PLATELET - Abnormal; Notable for the following:       Result Value   WBC 30.0 (*)    RBC 4.08 (*)    Hemoglobin 12.4 (*)    HCT 36.1 (*)    Neutro Abs 26.4 (*)    Monocytes Absolute 1.6 (*)    All other components within normal limits  BASIC METABOLIC PANEL - Abnormal; Notable for the following:    Glucose, Bld 132 (*)    BUN 22 (*)    Creatinine, Ser 1.61 (*)    GFR calc non Af Amer 40 (*)    GFR calc Af Amer 46 (*)    All other components within normal limits  CULTURE, BLOOD (ROUTINE X 2)  CULTURE, BLOOD (ROUTINE X 2)  TROPONIN I   ____________________________________________  EKG  EKG read and interpreted by me shows a flutter at a rate of 97 normal axis no acute changes ____________________________________________  RADIOLOGY  Dg Chest 2 View  Result Date: 06/08/2017 CLINICAL DATA:  Status post fall. History of hypertension, AFib and mitral regurgitation. EXAM: CHEST  2 VIEW COMPARISON:  03/13/2015 FINDINGS: Cardiomediastinal silhouette is normal. Mediastinal contours  appear intact. Calcific atherosclerotic disease of the aorta. There is no evidence of pleural effusion or pneumothorax. Bilateral large patchy areas of airspace consolidation versus pulmonary masses. Osseous structures are without acute abnormality. Soft tissues are grossly normal. IMPRESSION: Bilateral large patchy areas of airspace consolidation versus pulmonary masses. Further evaluation with chest CT, preferably with contrast, is recommended. Electronically Signed   By: Fidela Salisbury M.D.   On: 06/08/2017 19:33   Ct Head Wo Contrast  Result Date: 06/08/2017 CLINICAL DATA:  Status post fall with impact to the back of the head. EXAM: CT HEAD WITHOUT CONTRAST CT CERVICAL SPINE WITHOUT CONTRAST TECHNIQUE: Multidetector CT imaging of the head and cervical spine was performed following the standard protocol without intravenous contrast. Multiplanar CT image reconstructions of the cervical spine were also generated. COMPARISON:  06/07/2017 FINDINGS: CT HEAD FINDINGS Brain: No evidence of acute infarction, hemorrhage, hydrocephalus, extra-axial collection or mass lesion/mass effect. Advanced for age brain parenchymal atrophy and periventricular microangiopathy. Prior right basal ganglia and cerebellar lacunar infarcts. Vascular: Calcific atherosclerotic disease at the skullbase. Skull: Normal. Negative for fracture or focal lesion. Sinuses/Orbits: No acute finding. Other: None. CT CERVICAL SPINE FINDINGS Alignment: Normal. Skull base and vertebrae: No acute fracture. No primary bone lesion or focal pathologic process. Soft tissues and spinal canal: No prevertebral fluid or swelling. No visible canal hematoma. Disc levels:  Multilevel osteoarthritic changes. Upper chest: 1.3 cm soft tissue mass in the right upper lobe. Probable bilateral small pleural effusions. Other: Marked enlargement of the right thyroid gland measuring 5.4 by 5.4 by 8.1 cm. Right anterior chain cervical lymphadenopathy. IMPRESSION: No  acute intracranial abnormality. Advanced for age brain parenchymal atrophy and chronic microvascular disease. Prior right basal ganglia and cerebellar lacunar infarcts. No evidence of acute traumatic injury to the cervical spine. Multilevel osteoarthritic changes of the cervical spine. 1.3 cm soft tissue mass in the right upper lobe, suspicious for pulmonary primary or metastatic malignancy. Marked  enlargement of the right thyroid gland measuring up to 8.1 cm. Right anterior chain cervical lymphadenopathy. Electronically Signed   By: Fidela Salisbury M.D.   On: 06/08/2017 19:44   Ct Cervical Spine Wo Contrast  Result Date: 06/08/2017 CLINICAL DATA:  Status post fall with impact to the back of the head. EXAM: CT HEAD WITHOUT CONTRAST CT CERVICAL SPINE WITHOUT CONTRAST TECHNIQUE: Multidetector CT imaging of the head and cervical spine was performed following the standard protocol without intravenous contrast. Multiplanar CT image reconstructions of the cervical spine were also generated. COMPARISON:  06/07/2017 FINDINGS: CT HEAD FINDINGS Brain: No evidence of acute infarction, hemorrhage, hydrocephalus, extra-axial collection or mass lesion/mass effect. Advanced for age brain parenchymal atrophy and periventricular microangiopathy. Prior right basal ganglia and cerebellar lacunar infarcts. Vascular: Calcific atherosclerotic disease at the skullbase. Skull: Normal. Negative for fracture or focal lesion. Sinuses/Orbits: No acute finding. Other: None. CT CERVICAL SPINE FINDINGS Alignment: Normal. Skull base and vertebrae: No acute fracture. No primary bone lesion or focal pathologic process. Soft tissues and spinal canal: No prevertebral fluid or swelling. No visible canal hematoma. Disc levels:  Multilevel osteoarthritic changes. Upper chest: 1.3 cm soft tissue mass in the right upper lobe. Probable bilateral small pleural effusions. Other: Marked enlargement of the right thyroid gland measuring 5.4 by 5.4 by 8.1  cm. Right anterior chain cervical lymphadenopathy. IMPRESSION: No acute intracranial abnormality. Advanced for age brain parenchymal atrophy and chronic microvascular disease. Prior right basal ganglia and cerebellar lacunar infarcts. No evidence of acute traumatic injury to the cervical spine. Multilevel osteoarthritic changes of the cervical spine. 1.3 cm soft tissue mass in the right upper lobe, suspicious for pulmonary primary or metastatic malignancy. Marked enlargement of the right thyroid gland measuring up to 8.1 cm. Right anterior chain cervical lymphadenopathy. Electronically Signed   By: Fidela Salisbury M.D.   On: 06/08/2017 19:44    ____________________________________________   PROCEDURES  Procedure(s) performed:   Procedures  Critical Care performed:   ____________________________________________   INITIAL IMPRESSION / ASSESSMENT AND PLAN / ED COURSE  Pertinent labs & imaging results that were available during my care of the patient were reviewed by me and considered in my medical decision making (see chart for details).  Patient has long densities possibly masses and a high white blood count we'll plan on admitting him but we will get the CT report back first give him some antibiotics      ____________________________________________   FINAL CLINICAL IMPRESSION(S) / ED DIAGNOSES  Final diagnoses:  Fall, initial encounter  Lung mass      NEW MEDICATIONS STARTED DURING THIS VISIT:  New Prescriptions   No medications on file     Note:  This document was prepared using Dragon voice recognition software and may include unintentional dictation errors.    Nena Polio, MD 06/08/17 2113

## 2017-06-08 NOTE — H&P (Addendum)
History and Physical   [facility] - [place] @ [facility] Admission History and Physical [employer], D.O.    Patient Name: Ian Townsend MR#: [id number] Date of Birth: [birth year] Date of Admission: 06/08/2017  Referring MD/NP/PA: Dr. Darnelle Catalan Primary Care Physician: Jerl Mina, MD  Chief Complaint:  Chief Complaint  Patient presents with  . Fall  Please note the entire history is obtained from the patient's emergency department chart, emergency department provider and the patient's family. Patient's personal history is limited by poor historian, agitation.   HPI: Ian Townsend is a 76 y.o. male with a known history of BPH, Afib, CKD3, GERD, COPD, GERD, HLD, HTN,  PVD, CVA, seizures presents to the emergency department for evaluation of back pain s/p fall.  This is his second fall in two days.  Yesterday he sustained a mechanical fall and hit the back of his head, no LOC. He was seen in the ED and discharged home.  Today he had a similar fall except that he landed on his bottom after his walker got out from under him, no head trauma, no LOC. Patient's only complaints are headache and slight cough.  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  .    Lengthy conversation with the patient's son reveals concern for his psychomotor agitation, weakness and gait instability, symptoms that are similar to when he had a previous stroke. Patient's son states that ENT Dr. Jenne Campus has been following the thyroid mass, but risk of surgery outweighs the benefit.  Family was unaware of metastases.    Of note, during my exam, patient was found to have coughed up scant amount of bright red blood.   Patient denies fevers/chills, weakness, dizziness, chest pain,  shortness of breath, N/V/C/D, abdominal pain, dysuria/frequency, changes in mental status.   EMS/ED Course: Patient received Levaquin. Medical admission has been requested for further management of possible CAP and further evaluation of chest abnormalities.  Review of Systems:  CONSTITUTIONAL: No fever/chills, fatigue, weakness, weight gain/loss. Positive headache. EYES: No blurry or double vision. ENT: No tinnitus, postnasal drip, redness or soreness of the oropharynx. RESPIRATORY: Positive cough, negative dyspnea, wheeze.  Positive hemoptysis.  CARDIOVASCULAR: No chest pain, palpitations, syncope, orthopnea. No lower extremity edema.  GASTROINTESTINAL: No nausea, vomiting, abdominal pain, diarrhea, constipation.  No hematemesis, melena or hematochezia. GENITOURINARY: No dysuria, frequency, hematuria. ENDOCRINE: No polyuria or nocturia. No heat or cold intolerance. HEMATOLOGY: No anemia, bruising, bleeding. INTEGUMENTARY: No rashes, ulcers, lesions. MUSCULOSKELETAL: No arthritis, gout, dyspnea. Positive back pain per HPI NEUROLOGIC: No numbness, tingling, seizure-type activity, weakness. Positive agitation and gait instability.  PSYCHIATRIC: No anxiety, depression, insomnia.   Past Medical History:  Diagnosis Date  . Benign prostatic hypertrophy   . Broken arm    left  . Chronic atrial fibrillation (HCC)    a. not on long term anticoagulation 2/2 high risk fall and multiple falls  . Chronic kidney disease    a. stage III; b. baseline SCr approx 1.50  . COPD (chronic obstructive pulmonary disease) (HCC)   . Esophageal reflux   . Hyperlipidemia   . Hypertension   . 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 Mitral regurgitation    a. echo 2013: EF 65%, mod MR/TR, LA 4.4 cm, mod dilated RA  . Neglect  of one side of body    a. Left 2/2 stroke  . PVD (peripheral vascular disease) (HCC)   . Seasonal allergies   . Seizures (HCC)    a. at time of last stroke  . Stroke [facility])     a. x2, last approx 2011  . Syncope   . Unresponsiveness 02/08/2012    Past Surgical History:  Procedure Laterality Date  . COLONOSCOPY  2008   polyps  . ESOPHAGOGASTRODUODENOSCOPY N/A 02/21/2015   Procedure: ESOPHAGOGASTRODUODENOSCOPY (EGD);  Surgeon: Midge Minium, MD;  Location: [facility];  Service: Gastroenterology;  Laterality: N/A;  . FRACTURE SURGERY     ankle  . HERNIA REPAIR Bilateral 07-28-2013   inguinal  . UPPER GASTROINTESTINAL ENDOSCOPY  2008     reports that he has been smoking Cigars and Cigarettes.  He has a 12.50 pack-year smoking history. He has never used smokeless tobacco. He reports that he does not drink alcohol or use drugs.  Allergies  Allergen Reactions  . Penicillins Hives         Family History  Problem Relation Age of Onset  . Diabetes Mellitus II Mother   Father with HTN  Prior to Admission medications   Medication Sig Start Date End Date Taking? Authorizing Provider  acetaminophen (TYLENOL) 325 MG tablet Take 650 mg by mouth every 4 (four) hours as needed for pain or fever.     [provider]  albuterol (PROVENTIL HFA;VENTOLIN HFA) 108 (90 BASE) MCG/ACT inhaler Inhale 1 puff into the lungs every 6 (six) hours as needed for wheezing or shortness of breath.    [provider]  ASPIRIN LOW DOSE 81 MG EC tablet TAKE 1 TABLET BY MOUTH ONCE DAILY 12/25/15   Iran Ouch, MD  atorvastatin (LIPITOR) 20 MG tablet Take 20 mg by mouth daily. 9 PM    [provider]  Cholecalciferol (VITAMIN D-3) 1000 UNITS CAPS Take 2 capsules by mouth daily. 9 AM    [provider]  diltiazem (CARDIZEM CD) 120 MG 24 hr capsule Take 1 capsule (120 mg total) by mouth daily. 03/15/15   Houston Siren, MD  finasteride (PROSCAR) 5 MG tablet Take 5 mg by mouth daily. 9 am    [provider]  FLUoxetine (PROZAC) 20 MG tablet Take 20 mg by mouth daily. 9 AM    [provider]  Fluticasone-Salmeterol (ADVAIR) 250-50  MCG/DOSE AEPB Inhale 1 puff into the lungs daily.    [provider]  gabapentin (NEURONTIN) 100 MG capsule Take 1 capsule by mouth at bedtime. 9 AM 06/12/13   [provider]  lisinopril (PRINIVIL,ZESTRIL) 20 MG tablet Take 1 tablet (20 mg total) by mouth daily. 05/14/14   Iran Ouch, MD  Melatonin 5 MG TABS Take by mouth daily. 8 PM    [provider]  phenytoin (DILANTIN) 100 MG ER capsule Take 100 mg by mouth daily. 9 AM    [provider]  PHENYTOIN INFATABS 50 MG tablet 50 mg once. 9 AM 06/12/13   [provider]  polyethylene glycol (MIRALAX / GLYCOLAX) packet Take 17 g by mouth daily. 9 AM    [provider]  solifenacin (VESICARE) 10 MG tablet Take 10 mg by mouth daily. 9 AM    [provider]  tamsulosin (FLOMAX) 0.4 MG CAPS Take 0.4 mg by mouth daily. 9 AM    [provider]  tiotropium (SPIRIVA) 18 MCG inhalation capsule Place 1 capsule (18 mcg total)  into inhaler and inhale daily. 03/15/15   Houston Siren, MD    Physical Exam: Vitals:   06/08/17 1838 06/08/17 1930 06/08/17 2030 06/08/17 2100  BP:  (!) 149/73 (!) 150/65 (!) 156/54  Pulse:  88 79 98  Resp:  (!) 22 19 (!) 30  Temp:      TempSrc:      SpO2:  93% 94% 97%  Weight: 63.5 kg (140 lb)     Height: 5\' 6"  (1.676 m)       GENERAL: 76 y.o.-year-old male patient, well-developed, well-nourished lying in the bed in no acute distress.  Pleasant and cooperative, mild psychomotor agitation.   HEENT: Head atraumatic, normocephalic. Pupils equal. Mucus membranes moist. NECK: Supple, full range of motion. No JVD, no bruit heard. Positive thyroid enlargement, no tenderness.  CHEST: Normal breath sounds bilaterally. No wheezing, rales, rhonchi or crackles. No use of accessory muscles of respiration.  No reproducible chest wall tenderness.  Speaking in full sentences.  CARDIOVASCULAR: S1, S2 normal. No murmurs, rubs, or gallops. Cap refill <2 seconds. Pulses  intact distally.  ABDOMEN: Soft, nondistended, nontender. No rebound, guarding, rigidity. Normoactive bowel sounds present in all four quadrants.  EXTREMITIES: No pedal edema, cyanosis, or clubbing. No calf tenderness or Homan's sign.  NEUROLOGIC: The patient is alert and oriented x 2. Cranial nerves II through XII are grossly intact with no focal sensorimotor deficit.Marland Kitchen SKIN: Warm, dry, and intact without obvious rash, lesion, or ulcer.    Labs on Admission:  CBC:  Recent Labs Lab 06/08/17 2024  WBC 30.0*  NEUTROABS 26.4*  HGB 12.4*  HCT 36.1*  MCV 88.4  PLT 255   Basic Metabolic Panel:  Recent Labs Lab 06/08/17 2024  NA 136  K 3.7  CL 101  CO2 26  GLUCOSE 132*  BUN 22*  CREATININE 1.61*  CALCIUM 10.0   GFR: Estimated Creatinine Clearance: 35.1 mL/min (A) (by C-G formula based on SCr of 1.61 mg/dL (H)). Liver Function Tests: No results for input(s): AST, ALT, ALKPHOS, BILITOT, PROT, ALBUMIN in the last 168 hours. No results for input(s): LIPASE, AMYLASE in the last 168 hours. No results for input(s): AMMONIA in the last 168 hours. Coagulation Profile: No results for input(s): INR, PROTIME in the last 168 hours. Cardiac Enzymes:  Recent Labs Lab 06/08/17 2024  TROPONINI <0.03   BNP (last 3 results) No results for input(s): PROBNP in the last 8760 hours. HbA1C: No results for input(s): HGBA1C in the last 72 hours. CBG: No results for input(s): GLUCAP in the last 168 hours. Lipid Profile: No results for input(s): CHOL, HDL, LDLCALC, TRIG, CHOLHDL, LDLDIRECT in the last 72 hours. Thyroid Function Tests: No results for input(s): TSH, T4TOTAL, FREET4, T3FREE, THYROIDAB in the last 72 hours. Anemia Panel: No results for input(s): VITAMINB12, FOLATE, FERRITIN, TIBC, IRON, RETICCTPCT in the last 72 hours. Urine analysis:    Component Value Date/Time   COLORURINE Straw 02/15/2014 0345   COLORURINE AMBER BIOCHEMICALS MAY BE AFFECTED BY COLOR (A) 09/15/2010 2333    APPEARANCEUR Clear 02/15/2014 0345   LABSPEC 1.008 02/15/2014 0345   PHURINE 7.0 02/15/2014 0345   PHURINE 6.0 09/15/2010 2333   GLUCOSEU Negative 02/15/2014 0345   HGBUR Negative 02/15/2014 0345   HGBUR TRACE (A) 09/15/2010 2333   BILIRUBINUR Negative 02/15/2014 0345   KETONESUR Negative 02/15/2014 0345   KETONESUR 40 (A) 09/15/2010 2333   PROTEINUR Negative 02/15/2014 0345   PROTEINUR 100 (A) 09/15/2010 2333   UROBILINOGEN 1.0 09/15/2010 2333  NITRITE Negative 02/15/2014 0345   NITRITE NEGATIVE 09/15/2010 2333   LEUKOCYTESUR Negative 02/15/2014 0345   Sepsis Labs: @LABRCNTIP (procalcitonin:4,lacticidven:4) )No results found for this or any previous visit (from the past 240 hour(s)).   Radiological Exams on Admission: Dg Chest 2 View  Result Date: 06/08/2017 CLINICAL DATA:  Status post fall. History of hypertension, AFib and mitral regurgitation. EXAM: CHEST  2 VIEW COMPARISON:  03/13/2015 FINDINGS: Cardiomediastinal silhouette is normal. Mediastinal contours appear intact. Calcific atherosclerotic disease of the aorta. There is no evidence of pleural effusion or pneumothorax. Bilateral large patchy areas of airspace consolidation versus pulmonary masses. Osseous structures are without acute abnormality. Soft tissues are grossly normal. IMPRESSION: Bilateral large patchy areas of airspace consolidation versus pulmonary masses. Further evaluation with chest CT, preferably with contrast, is recommended. Electronically Signed   By: Ted Mcalpine M.D.   On: 06/08/2017 19:33   Ct Head Wo Contrast  Result Date: 06/08/2017 CLINICAL DATA:  Status post fall with impact to the back of the head. EXAM: CT HEAD WITHOUT CONTRAST CT CERVICAL SPINE WITHOUT CONTRAST TECHNIQUE: Multidetector CT imaging of the head and cervical spine was performed following the standard protocol without intravenous contrast. Multiplanar CT image reconstructions of the cervical spine were also generated.  COMPARISON:  06/07/2017 FINDINGS: CT HEAD FINDINGS Brain: No evidence of acute infarction, hemorrhage, hydrocephalus, extra-axial collection or mass lesion/mass effect. Advanced for age brain parenchymal atrophy and periventricular microangiopathy. Prior right basal ganglia and cerebellar lacunar infarcts. Vascular: Calcific atherosclerotic disease at the skullbase. Skull: Normal. Negative for fracture or focal lesion. Sinuses/Orbits: No acute finding. Other: None. CT CERVICAL SPINE FINDINGS Alignment: Normal. Skull base and vertebrae: No acute fracture. No primary bone lesion or focal pathologic process. Soft tissues and spinal canal: No prevertebral fluid or swelling. No visible canal hematoma. Disc levels:  Multilevel osteoarthritic changes. Upper chest: 1.3 cm soft tissue mass in the right upper lobe. Probable bilateral small pleural effusions. Other: Marked enlargement of the right thyroid gland measuring 5.4 by 5.4 by 8.1 cm. Right anterior chain cervical lymphadenopathy. IMPRESSION: No acute intracranial abnormality. Advanced for age brain parenchymal atrophy and chronic microvascular disease. Prior right basal ganglia and cerebellar lacunar infarcts. No evidence of acute traumatic injury to the cervical spine. Multilevel osteoarthritic changes of the cervical spine. 1.3 cm soft tissue mass in the right upper lobe, suspicious for pulmonary primary or metastatic malignancy. Marked enlargement of the right thyroid gland measuring up to 8.1 cm. Right anterior chain cervical lymphadenopathy. Electronically Signed   By: Ted Mcalpine M.D.   On: 06/08/2017 19:44   Ct Head Wo Contrast  Result Date: 06/07/2017 CLINICAL DATA:  Fall EXAM: CT HEAD WITHOUT CONTRAST TECHNIQUE: Contiguous axial images were obtained from the base of the skull through the vertex without intravenous contrast. COMPARISON:  Head CT 03/16/2017 FINDINGS: Brain: No mass lesion, intraparenchymal hemorrhage or extra-axial collection. No  evidence of acute cortical infarct. There is periventricular hypoattenuation compatible with chronic microvascular disease. Old right basal ganglia lacunar infarct. Age advanced cerebral and cerebellar atrophy with ventriculomegaly. Vascular: Atherosclerotic calcification of the vertebral and internal carotid arteries at the skull base. Skull: Normal visualized skull base, calvarium and extracranial soft tissues. Sinuses/Orbits: No sinus fluid levels or advanced mucosal thickening. No mastoid effusion. Normal orbits. IMPRESSION: 1. No acute intracranial abnormality. 2. Age advanced atrophy, chronic hypertensive microangiopathy and old right basal ganglia lacunar infarct. Electronically Signed   By: Deatra Robinson M.D.   On: 06/07/2017 18:38   Ct Cervical Spine Wo  Contrast  Result Date: 06/08/2017 CLINICAL DATA:  Status post fall with impact to the back of the head. EXAM: CT HEAD WITHOUT CONTRAST CT CERVICAL SPINE WITHOUT CONTRAST TECHNIQUE: Multidetector CT imaging of the head and cervical spine was performed following the standard protocol without intravenous contrast. Multiplanar CT image reconstructions of the cervical spine were also generated. COMPARISON:  06/07/2017 FINDINGS: CT HEAD FINDINGS Brain: No evidence of acute infarction, hemorrhage, hydrocephalus, extra-axial collection or mass lesion/mass effect. Advanced for age brain parenchymal atrophy and periventricular microangiopathy. Prior right basal ganglia and cerebellar lacunar infarcts. Vascular: Calcific atherosclerotic disease at the skullbase. Skull: Normal. Negative for fracture or focal lesion. Sinuses/Orbits: No acute finding. Other: None. CT CERVICAL SPINE FINDINGS Alignment: Normal. Skull base and vertebrae: No acute fracture. No primary bone lesion or focal pathologic process. Soft tissues and spinal canal: No prevertebral fluid or swelling. No visible canal hematoma. Disc levels:  Multilevel osteoarthritic changes. Upper chest: 1.3 cm soft  tissue mass in the right upper lobe. Probable bilateral small pleural effusions. Other: Marked enlargement of the right thyroid gland measuring 5.4 by 5.4 by 8.1 cm. Right anterior chain cervical lymphadenopathy. IMPRESSION: No acute intracranial abnormality. Advanced for age brain parenchymal atrophy and chronic microvascular disease. Prior right basal ganglia and cerebellar lacunar infarcts. No evidence of acute traumatic injury to the cervical spine. Multilevel osteoarthritic changes of the cervical spine. 1.3 cm soft tissue mass in the right upper lobe, suspicious for pulmonary primary or metastatic malignancy. Marked enlargement of the right thyroid gland measuring up to 8.1 cm. Right anterior chain cervical lymphadenopathy. Electronically Signed   By: Ted Mcalpine M.D.   On: 06/08/2017 19:44    EKG: Atrial flutter at 97 bpm with normal axis and nonspecific ST-T wave changes.   Assessment/Plan  This is a 76 y.o. male with a history of BPH, afib, CKD3, GERD, COPD, GERD, HLD, HTN,  PVD, CVA, seizures  now being admitted with:  #. Cough, leukocytosis, possible Community Acquired Pneumonia - Admit to inpatient - IV Levaquin per pharmacy - IV fluid hydration - Duonebs, expectorants & O2 therapy as needed - Follow up blood & sputum cultures  #. Psychomotor agitation, symptoms similar to prior stroke - Telemetry observation for neuro workup including: - Studies: MRA/MRI, Echo, Carotids - Labs: CBC, BMP, Lipids, TFTs, A1C - Nursing: Neurochecks, O2, dysphagia screen, permissive hypertension.  - Consults: Neurology, PT/OT, S/S consults.  - Meds: Daily aspirin 81mg .   - Fluids: IVNS@75cc /hr.   - Routine DVT Px: with Lovenox, SCDs, early ambulation  #. 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 Headache and back pain s/p fall -  Pain control - PT eval in AM  #. AKI on CKD - IVFs and recheck BMP in AM  #. History of GERD - Continue Protonix  #. History of CVA - Continue aspirin  #. History of COPD - Continue Advair  #. History of A. fib - Continue Cardizem  #. History of depression - Continue Prozac  #. History of hypertension -Continue lisinopril  #. History of hyperlipidemia -Continue Lipitor  #. History of BPH -Continue Vesicare, Proscar, Flomax  #. History of seizures -Continue Dilantin  Admission status: Inpatient IV Fluids: NS Diet/Nutrition: HH Consults called: Neuro, Cardiothoracics, PT DVT Px: Lovenox, SCDs and early ambulation. Code Status: Full Code  Disposition Plan: To be determined  All the records  are reviewed and case discussed with ED provider. Management plans discussed with the patient and/or family who express understanding and agree with plan of care.  Christepher Melchior D.O. on 06/08/2017 at 9:32 PM Between 7am to 6pm - Pager - [phone] After 6pm go to [email or web address] - Social research officer, government [facility] [place] [facility] Office [phone] CC: Primary care physician; Jerl Mina, MD   06/08/2017, 9:32 PM

## 2017-06-08 NOTE — Progress Notes (Signed)
ED called to give report at 21 minutes post assign.  Nurse taking patient was off the floor at the time and will call back to get report.

## 2017-06-08 NOTE — ED Notes (Signed)
Report attempted after 20 minutes since bed assigned

## 2017-06-08 NOTE — ED Triage Notes (Signed)
Pt via ems from [facility] after unwitnessed fall. Pt states his walker got ahead of him and he fell, hitting he back of his head. Pt states he did not lose consciousness. PT alert & oriented (except to date). NAD noted.

## 2017-06-09 ENCOUNTER — Inpatient Hospital Stay: Payer: Medicare Other

## 2017-06-09 ENCOUNTER — Inpatient Hospital Stay (HOSPITAL_COMMUNITY)
Admit: 2017-06-09 | Discharge: 2017-06-09 | Disposition: A | Discharge status: Home / Self Care | Payer: Medicare Other | Attending: Family Medicine | Admitting: Family Medicine

## 2017-06-09 DIAGNOSIS — I129 Hypertensive chronic kidney disease with stage 1 through stage 4 chronic kidney disease, or unspecified chronic kidney disease: Secondary | ICD-10-CM

## 2017-06-09 DIAGNOSIS — I4891 Unspecified atrial fibrillation: Secondary | ICD-10-CM

## 2017-06-09 DIAGNOSIS — Z9181 History of falling: Secondary | ICD-10-CM

## 2017-06-09 DIAGNOSIS — W19XXXA Unspecified fall, initial encounter: Secondary | ICD-10-CM

## 2017-06-09 DIAGNOSIS — F17210 Nicotine dependence, cigarettes, uncomplicated: Secondary | ICD-10-CM

## 2017-06-09 DIAGNOSIS — I361 Nonrheumatic tricuspid (valve) insufficiency: Secondary | ICD-10-CM

## 2017-06-09 DIAGNOSIS — R221 Localized swelling, mass and lump, neck: Secondary | ICD-10-CM

## 2017-06-09 DIAGNOSIS — D72829 Elevated white blood cell count, unspecified: Secondary | ICD-10-CM

## 2017-06-09 DIAGNOSIS — M545 Low back pain: Secondary | ICD-10-CM

## 2017-06-09 DIAGNOSIS — I739 Peripheral vascular disease, unspecified: Secondary | ICD-10-CM

## 2017-06-09 DIAGNOSIS — R410 Disorientation, unspecified: Secondary | ICD-10-CM

## 2017-06-09 DIAGNOSIS — I340 Nonrheumatic mitral (valve) insufficiency: Secondary | ICD-10-CM

## 2017-06-09 DIAGNOSIS — N183 Chronic kidney disease, stage 3 (moderate): Secondary | ICD-10-CM

## 2017-06-09 DIAGNOSIS — R918 Other nonspecific abnormal finding of lung field: Secondary | ICD-10-CM

## 2017-06-09 DIAGNOSIS — Z79899 Other long term (current) drug therapy: Secondary | ICD-10-CM

## 2017-06-09 DIAGNOSIS — E785 Hyperlipidemia, unspecified: Secondary | ICD-10-CM

## 2017-06-09 DIAGNOSIS — J449 Chronic obstructive pulmonary disease, unspecified: Secondary | ICD-10-CM

## 2017-06-09 DIAGNOSIS — Z8673 Personal history of transient ischemic attack (TIA), and cerebral infarction without residual deficits: Secondary | ICD-10-CM

## 2017-06-09 LAB — CBC
HCT: 35.9 % — ABNORMAL LOW (ref 40.0–52.0)
Hemoglobin: 12.3 g/dL — ABNORMAL LOW (ref 13.0–18.0)
MCH: 30.4 pg (ref 26.0–34.0)
MCHC: 34.4 g/dL (ref 32.0–36.0)
MCV: 88.5 fL (ref 80.0–100.0)
PLATELETS: 266 10*3/uL (ref 150–440)
RBC: 4.05 MIL/uL — ABNORMAL LOW (ref 4.40–5.90)
RDW: 13 % (ref 11.5–14.5)
WBC: 29.6 10*3/uL — AB (ref 3.8–10.6)

## 2017-06-09 LAB — BASIC METABOLIC PANEL
ANION GAP: 9 (ref 5–15)
BUN: 20 mg/dL (ref 6–20)
CALCIUM: 9.9 mg/dL (ref 8.9–10.3)
CO2: 26 mmol/L (ref 22–32)
CREATININE: 1.43 mg/dL — AB (ref 0.61–1.24)
Chloride: 103 mmol/L (ref 101–111)
GFR calc Af Amer: 53 mL/min — ABNORMAL LOW (ref >60)
GFR, EST NON AFRICAN AMERICAN: 46 mL/min — AB (ref >60)
GLUCOSE: 142 mg/dL — AB (ref 65–99)
Potassium: 3.9 mmol/L (ref 3.5–5.1)
Sodium: 138 mmol/L (ref 135–145)

## 2017-06-09 LAB — EXPECTORATED SPUTUM ASSESSMENT W GRAM STAIN, RFLX TO RESP C: Special Requests: NORMAL

## 2017-06-09 LAB — ECHOCARDIOGRAM COMPLETE
Height: 66 in
Weight: 2240 oz

## 2017-06-09 LAB — LIPID PANEL
CHOLESTEROL: 118 mg/dL (ref 0–200)
HDL: 21 mg/dL — AB (ref >40)
LDL CALC: 71 mg/dL (ref 0–99)
TRIGLYCERIDES: 132 mg/dL (ref <150)
Total CHOL/HDL Ratio: 5.6 RATIO
VLDL: 26 mg/dL (ref 0–40)

## 2017-06-09 LAB — TSH: TSH: 0.564 u[IU]/mL (ref 0.350–4.500)

## 2017-06-09 LAB — EXPECTORATED SPUTUM ASSESSMENT W REFEX TO RESP CULTURE

## 2017-06-09 LAB — HEMOGLOBIN A1C
Hgb A1c MFr Bld: 6 % — ABNORMAL HIGH (ref 4.8–5.6)
Mean Plasma Glucose: 125.5 mg/dL

## 2017-06-09 LAB — MRSA PCR SCREENING: MRSA by PCR: NEGATIVE

## 2017-06-09 MED ORDER — ALPRAZOLAM 0.5 MG PO TABS
0.5000 mg | ORAL_TABLET | Freq: Once | ORAL | Status: AC
  Administered 2017-06-09: 04:00:00 0.5 mg via ORAL
  Filled 2017-06-09: qty 1

## 2017-06-09 MED ORDER — HYDRALAZINE HCL 20 MG/ML IJ SOLN
10.0000 mg | Freq: Four times a day (QID) | INTRAMUSCULAR | Status: DC | PRN
  Administered 2017-06-10: 03:00:00 10 mg via INTRAVENOUS
  Filled 2017-06-09: qty 1

## 2017-06-09 MED ORDER — POLYETHYLENE GLYCOL 3350 17 G PO PACK: 17.0000 g | PACK | Freq: Every day | ORAL | Status: DC

## 2017-06-09 MED ORDER — DARIFENACIN HYDROBROMIDE ER 7.5 MG PO TB24
7.5000 mg | ORAL_TABLET | Freq: Every day | ORAL | Status: DC
  Administered 2017-06-09 – 2017-06-14 (×6): 7.5 mg via ORAL
  Filled 2017-06-09 (×6): qty 1

## 2017-06-09 MED ORDER — ACETAMINOPHEN 325 MG PO TABS
650.0000 mg | ORAL_TABLET | Freq: Four times a day (QID) | ORAL | Status: DC | PRN
  Administered 2017-06-09 – 2017-06-12 (×6): 650 mg via ORAL
  Filled 2017-06-09 (×6): qty 2

## 2017-06-09 MED ORDER — VITAMIN D 1000 UNITS PO TABS
2000.0000 [IU] | ORAL_TABLET | Freq: Every day | ORAL | Status: DC
  Administered 2017-06-09 – 2017-06-14 (×6): 2000 [IU] via ORAL
  Filled 2017-06-09 (×6): qty 2

## 2017-06-09 MED ORDER — SODIUM CHLORIDE 0.9 % IV SOLN
INTRAVENOUS | Status: DC
  Administered 2017-06-09: 07:00:00 via INTRAVENOUS

## 2017-06-09 MED ORDER — SENNOSIDES-DOCUSATE SODIUM 8.6-50 MG PO TABS: 1.0000 | ORAL_TABLET | Freq: Every evening | ORAL | Status: DC | PRN

## 2017-06-09 MED ORDER — ONDANSETRON HCL 4 MG PO TABS: 4.0000 mg | ORAL_TABLET | Freq: Four times a day (QID) | ORAL | Status: DC | PRN

## 2017-06-09 MED ORDER — PHENYTOIN 50 MG PO CHEW
50.0000 mg | CHEWABLE_TABLET | Freq: Every day | ORAL | Status: DC
  Administered 2017-06-10 – 2017-06-14 (×5): 50 mg via ORAL
  Filled 2017-06-09 (×5): qty 1

## 2017-06-09 MED ORDER — LEVOFLOXACIN IN D5W 500 MG/100ML IV SOLN: 500.0000 mg | INTRAVENOUS | Status: DC

## 2017-06-09 MED ORDER — LISINOPRIL 20 MG PO TABS
20.0000 mg | ORAL_TABLET | Freq: Every day | ORAL | Status: DC
  Administered 2017-06-09 – 2017-06-14 (×6): 20 mg via ORAL
  Filled 2017-06-09 (×6): qty 1

## 2017-06-09 MED ORDER — ACETAMINOPHEN 650 MG RE SUPP: 650.0000 mg | Freq: Four times a day (QID) | RECTAL | Status: DC | PRN

## 2017-06-09 MED ORDER — NICOTINE 14 MG/24HR TD PT24
14.0000 mg | MEDICATED_PATCH | Freq: Every day | TRANSDERMAL | Status: DC
  Administered 2017-06-09 – 2017-06-14 (×6): 14 mg via TRANSDERMAL
  Filled 2017-06-09 (×6): qty 1

## 2017-06-09 MED ORDER — TAMSULOSIN HCL 0.4 MG PO CAPS
0.4000 mg | ORAL_CAPSULE | Freq: Every day | ORAL | Status: DC
  Administered 2017-06-09 – 2017-06-14 (×6): 0.4 mg via ORAL
  Filled 2017-06-09 (×6): qty 1

## 2017-06-09 MED ORDER — MOMETASONE FURO-FORMOTEROL FUM 200-5 MCG/ACT IN AERO
2.0000 | INHALATION_SPRAY | Freq: Two times a day (BID) | RESPIRATORY_TRACT | Status: DC
  Administered 2017-06-09 – 2017-06-14 (×11): 2 via RESPIRATORY_TRACT
  Filled 2017-06-09: qty 8.8

## 2017-06-09 MED ORDER — PANTOPRAZOLE SODIUM 40 MG PO TBEC
40.0000 mg | DELAYED_RELEASE_TABLET | Freq: Every day | ORAL | Status: DC
Start: 2017-06-09 — End: 2017-06-14
  Administered 2017-06-09 – 2017-06-14 (×6): 40 mg via ORAL
  Filled 2017-06-09 (×6): qty 1

## 2017-06-09 MED ORDER — MAGNESIUM CITRATE PO SOLN
1.0000 | Freq: Once | ORAL | Status: DC | PRN
  Filled 2017-06-09: qty 296

## 2017-06-09 MED ORDER — ASPIRIN EC 81 MG PO TBEC
81.0000 mg | DELAYED_RELEASE_TABLET | Freq: Every day | ORAL | Status: DC
  Administered 2017-06-09 – 2017-06-14 (×6): 81 mg via ORAL
  Filled 2017-06-09 (×7): qty 1

## 2017-06-09 MED ORDER — SODIUM CHLORIDE 0.9% FLUSH
3.0000 mL | Freq: Two times a day (BID) | INTRAVENOUS | Status: DC
  Administered 2017-06-09 – 2017-06-14 (×11): 3 mL via INTRAVENOUS

## 2017-06-09 MED ORDER — ASPIRIN 325 MG PO TABS
325.0000 mg | ORAL_TABLET | Freq: Every day | ORAL | Status: DC
  Filled 2017-06-09: qty 1

## 2017-06-09 MED ORDER — ALPRAZOLAM 0.5 MG PO TABS
0.2500 mg | ORAL_TABLET | Freq: Three times a day (TID) | ORAL | Status: DC | PRN
  Administered 2017-06-09: 21:00:00 0.25 mg via ORAL
  Filled 2017-06-09: qty 1

## 2017-06-09 MED ORDER — ATORVASTATIN CALCIUM 20 MG PO TABS
20.0000 mg | ORAL_TABLET | Freq: Every day | ORAL | Status: DC
  Administered 2017-06-09 – 2017-06-11 (×3): 20 mg via ORAL
  Filled 2017-06-09 (×3): qty 1

## 2017-06-09 MED ORDER — IPRATROPIUM BROMIDE 0.02 % IN SOLN
0.5000 mg | Freq: Four times a day (QID) | RESPIRATORY_TRACT | Status: DC | PRN
  Administered 2017-06-09: 0.5 mg via RESPIRATORY_TRACT
  Filled 2017-06-09: qty 2.5

## 2017-06-09 MED ORDER — ENOXAPARIN SODIUM 40 MG/0.4ML ~~LOC~~ SOLN
40.0000 mg | SUBCUTANEOUS | Status: DC
  Administered 2017-06-09 – 2017-06-13 (×5): 40 mg via SUBCUTANEOUS
  Filled 2017-06-09 (×5): qty 0.4

## 2017-06-09 MED ORDER — ONDANSETRON HCL 4 MG/2ML IJ SOLN: 4.0000 mg | Freq: Four times a day (QID) | INTRAMUSCULAR | Status: DC | PRN

## 2017-06-09 MED ORDER — STROKE: EARLY STAGES OF RECOVERY BOOK
Freq: Once | Status: AC
  Administered 2017-06-09: 02:00:00

## 2017-06-09 MED ORDER — GABAPENTIN 100 MG PO CAPS
100.0000 mg | ORAL_CAPSULE | Freq: Every day | ORAL | Status: DC
  Administered 2017-06-09 – 2017-06-13 (×5): 100 mg via ORAL
  Filled 2017-06-09 (×6): qty 1

## 2017-06-09 MED ORDER — PHENYTOIN 50 MG PO CHEW
50.0000 mg | CHEWABLE_TABLET | Freq: Once | ORAL | Status: AC
  Administered 2017-06-09: 07:00:00 50 mg via ORAL
  Filled 2017-06-09: qty 1

## 2017-06-09 MED ORDER — DILTIAZEM HCL ER COATED BEADS 120 MG PO CP24
120.0000 mg | ORAL_CAPSULE | Freq: Every day | ORAL | Status: DC
  Administered 2017-06-09 – 2017-06-14 (×6): 120 mg via ORAL
  Filled 2017-06-09 (×6): qty 1

## 2017-06-09 MED ORDER — ALBUTEROL SULFATE (2.5 MG/3ML) 0.083% IN NEBU
2.5000 mg | INHALATION_SOLUTION | Freq: Four times a day (QID) | RESPIRATORY_TRACT | Status: DC | PRN
  Administered 2017-06-09 – 2017-06-12 (×3): 2.5 mg via RESPIRATORY_TRACT
  Filled 2017-06-09 (×3): qty 3

## 2017-06-09 MED ORDER — FINASTERIDE 5 MG PO TABS
5.0000 mg | ORAL_TABLET | Freq: Every day | ORAL | Status: DC
  Administered 2017-06-09 – 2017-06-14 (×6): 5 mg via ORAL
  Filled 2017-06-09 (×6): qty 1

## 2017-06-09 MED ORDER — FLUOXETINE HCL 20 MG PO CAPS
20.0000 mg | ORAL_CAPSULE | Freq: Every day | ORAL | Status: DC
  Administered 2017-06-09 – 2017-06-14 (×6): 20 mg via ORAL
  Filled 2017-06-09 (×6): qty 1

## 2017-06-09 MED ORDER — PHENYTOIN SODIUM EXTENDED 100 MG PO CAPS
100.0000 mg | ORAL_CAPSULE | Freq: Every day | ORAL | Status: DC
  Administered 2017-06-09 – 2017-06-14 (×6): 100 mg via ORAL
  Filled 2017-06-09 (×7): qty 1

## 2017-06-09 MED ORDER — BISACODYL 5 MG PO TBEC
5.0000 mg | DELAYED_RELEASE_TABLET | Freq: Every day | ORAL | Status: DC | PRN
  Administered 2017-06-13: 15:00:00 5 mg via ORAL
  Filled 2017-06-09: qty 1

## 2017-06-09 MED ORDER — LEVOFLOXACIN IN D5W 750 MG/150ML IV SOLN
750.0000 mg | INTRAVENOUS | Status: DC
  Administered 2017-06-10 – 2017-06-12 (×2): 750 mg via INTRAVENOUS
  Filled 2017-06-09 (×4): qty 150

## 2017-06-09 MED ORDER — ASPIRIN 300 MG RE SUPP: 300.0000 mg | Freq: Every day | RECTAL | Status: DC

## 2017-06-09 MED ORDER — OXYCODONE HCL 5 MG PO TABS: 5.0000 mg | ORAL_TABLET | ORAL | Status: DC | PRN

## 2017-06-09 NOTE — Progress Notes (Signed)
Patient ID: Ian Townsend, male   DOB: 11/06/1940, 76 y.o.   MRN: [id number]   Please note, I had a lengthy telephone conversation with the patient's son who was very upset about his father's care in the Emergency Department.  I explained all of his current problems and my plan for treating/evaluating them as detailed in my A/P.  He expressed understanding and agreed with plan.

## 2017-06-09 NOTE — Progress Notes (Signed)
[facility] [facility] [place] [facility]   PATIENT NAME: Ian Townsend    MR#:  [id number]  DATE OF BIRTH:  [birth year]  SUBJECTIVE:   Patient complaining of some back pain. He had 2 falls over the past 2-3 days.  Patient's son at bedside  No acute events overnight  REVIEW OF SYSTEMS:    Review of Systems  Constitutional: Negative for fever, chills weight loss HENT: Negative for ear pain, nosebleeds, congestion, facial swelling, rhinorrhea, neck pain, neck stiffness and ear discharge.   Respiratory: Negative for cough, shortness of breath, wheezing  Cardiovascular: Negative for chest pain, palpitations and leg swelling.  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  Psychiatric/Behavioral: Negative for hallucinations, confusion, dysphoric mood    Tolerating Diet:yes      DRUG ALLERGIES:   Allergies  Allergen Reactions  . Penicillins Hives         VITALS:  Blood pressure (!) 167/68, pulse 84, temperature (!) 97.4 F (36.3 C), temperature source Axillary, resp. rate 16, height 5\' 6"  (1.676 m), weight 63.5 kg (140 lb), SpO2 96 %.  PHYSICAL EXAMINATION:  Constitutional: Appears well-developed and well-nourished. No distress. HENT: Normocephalic. Marland Kitchen Oropharynx is clear and moist.  Eyes: Conjunctivae and EOM are normal. PERRLA, no scleral icterus.  Neck: Normal ROM. Neck supple. No JVD. No tracheal deviation. CVS: RRR, S1/S2 +, no murmurs, no gallops, no carotid bruit.  Pulmonary: Effort normal, He has decreased breath sounds with bilateral rails  Abdominal: Soft. BS +,  no distension, tenderness, rebound or guarding.  Musculoskeletal: Normal range of motion. No edema and no tenderness.  Neuro: Alert  oriented to name and place not time. CN 2-12 grossly intact.left sided neglect baseline Moves all extremitites Skin: Skin is warm and dry. No rash noted. Psychiatric: flat affect.      LABORATORY PANEL:   CBC  Recent Labs Lab 06/09/17 0424  WBC 29.6*  HGB 12.3*  HCT 35.9*  PLT 266   ------------------------------------------------------------------------------------------------------------------  Chemistries   Recent Labs Lab 06/09/17 0424  NA 138  K 3.9  CL 103  CO2 26  GLUCOSE 142*  BUN 20  CREATININE 1.43*  CALCIUM 9.9   ------------------------------------------------------------------------------------------------------------------  Cardiac Enzymes  Recent Labs Lab 06/08/17 2024  TROPONINI <0.03   ------------------------------------------------------------------------------------------------------------------  RADIOLOGY:  Dg Chest 2 View  Result Date: 06/08/2017 CLINICAL DATA:  Status post fall. History of hypertension, AFib and mitral regurgitation. EXAM: CHEST  2 VIEW COMPARISON:  03/13/2015 FINDINGS: Cardiomediastinal silhouette is normal. Mediastinal contours appear intact. Calcific atherosclerotic disease of the aorta. There is no evidence of pleural effusion or pneumothorax. Bilateral large patchy areas of airspace consolidation versus pulmonary masses. Osseous structures are without acute abnormality. Soft tissues are grossly normal. IMPRESSION: Bilateral large patchy areas of airspace consolidation versus pulmonary masses. Further evaluation with chest CT, preferably with contrast, is recommended. Electronically Signed   By: Ted Mcalpine M.D.   On: 06/08/2017 19:33   Ct Head Wo Contrast  Result Date: 06/08/2017 CLINICAL DATA:  Status post fall with impact to the back of the head. EXAM: CT HEAD WITHOUT CONTRAST CT CERVICAL SPINE WITHOUT CONTRAST TECHNIQUE: Multidetector CT imaging of the head and cervical spine was performed following the  standard protocol without intravenous contrast. Multiplanar CT image reconstructions of the cervical spine were also generated. COMPARISON:  06/07/2017 FINDINGS: CT HEAD  FINDINGS Brain: No evidence of acute infarction, hemorrhage, hydrocephalus, extra-axial collection or mass lesion/mass effect. Advanced for age brain parenchymal atrophy and periventricular microangiopathy. Prior right basal ganglia and cerebellar lacunar infarcts. Vascular: Calcific atherosclerotic disease at the skullbase. Skull: Normal. Negative for fracture or focal lesion. Sinuses/Orbits: No acute finding. Other: None. CT CERVICAL SPINE FINDINGS Alignment: Normal. Skull base and vertebrae: No acute fracture. No primary bone lesion or focal pathologic process. Soft tissues and spinal canal: No prevertebral fluid or swelling. No visible canal hematoma. Disc levels:  Multilevel osteoarthritic changes. Upper chest: 1.3 cm soft tissue mass in the right upper lobe. Probable bilateral small pleural effusions. Other: Marked enlargement of the right thyroid gland measuring 5.4 by 5.4 by 8.1 cm. Right anterior chain cervical lymphadenopathy. IMPRESSION: No acute intracranial abnormality. Advanced for age brain parenchymal atrophy and chronic microvascular disease. Prior right basal ganglia and cerebellar lacunar infarcts. No evidence of acute traumatic injury to the cervical spine. Multilevel osteoarthritic changes of the cervical spine. 1.3 cm soft tissue mass in the right upper lobe, suspicious for pulmonary primary or metastatic malignancy. Marked enlargement of the right thyroid gland measuring up to 8.1 cm. Right anterior chain cervical lymphadenopathy. Electronically Signed   By: Ted Mcalpine M.D.   On: 06/08/2017 19:44   Ct Head Wo Contrast  Result Date: 06/07/2017 CLINICAL DATA:  Fall EXAM: CT HEAD WITHOUT CONTRAST TECHNIQUE: Contiguous axial images were obtained from the base of the skull through the vertex without intravenous  contrast. COMPARISON:  Head CT 03/16/2017 FINDINGS: Brain: No mass lesion, intraparenchymal hemorrhage or extra-axial collection. No evidence of acute cortical infarct. There is periventricular hypoattenuation compatible with chronic microvascular disease. Old right basal ganglia lacunar infarct. Age advanced cerebral and cerebellar atrophy with ventriculomegaly. Vascular: Atherosclerotic calcification of the vertebral and internal carotid arteries at the skull base. Skull: Normal visualized skull base, calvarium and extracranial soft tissues. Sinuses/Orbits: No sinus fluid levels or advanced mucosal thickening. No mastoid effusion. Normal orbits. IMPRESSION: 1. No acute intracranial abnormality. 2. Age advanced atrophy, chronic hypertensive microangiopathy and old right basal ganglia lacunar infarct. Electronically Signed   By: Deatra Robinson M.D.   On: 06/07/2017 18:38   Ct Chest W Contrast  Result Date: 06/08/2017 CLINICAL DATA:  Cough, unwitnessed fall. EXAM: CT CHEST WITH CONTRAST TECHNIQUE: Multidetector CT imaging of the chest was performed during intravenous contrast administration. CONTRAST:  60mL ISOVUE-300 IOPAMIDOL (ISOVUE-300) INJECTION 61% COMPARISON:  Radiographs of same day.  CT scan of September 22, 2012. FINDINGS: Cardiovascular: Atherosclerosis of thoracic aorta is noted without aneurysm or dissection. Coronary artery calcifications are noted suggesting coronary artery disease. No pericardial effusion is noted. Mediastinum/Nodes: Large right hypodense thyroid mass is noted which is significantly increased in size compared to prior exam, concerning for malignancy. This results in significant displacement of the trachea to the left. 3.2 cm left hilar lymph node is noted. Moderate size hiatal hernia is noted. Lungs/Pleura: Mild bilateral pleural effusions are noted. Multiple pulmonary masses are noted bilaterally consistent with metastatic disease. The largest on the left measures 7.3 cm posteriorly  in the left lower lobe. The largest on the right measures 8.4 cm anteriorly in the right middle lobe. No pneumothorax is noted. Upper Abdomen: No acute abnormality. Musculoskeletal: No chest wall abnormality. No acute or significant osseous findings. IMPRESSION: Large right hypodense thyroid mass is noted which is significantly increased in size compared to prior exam concerning for malignancy. This results in significant displacement of the trachea to the left. 3.2 cm  left hilar lymph node is noted concerning for metastatic disease. Also noted are multiple pulmonary nodules in both lungs with the largest measuring approximately 8 cm in the right middle lobe. These are concerning for metastatic disease is well. Coronary artery calcifications are noted suggesting coronary artery disease. Mild bilateral pleural effusions are noted. Moderate size hiatal hernia is noted. Aortic Atherosclerosis (ICD10-I70.0). Electronically Signed   By: Lupita Raider, M.D.   On: 06/08/2017 22:01   Ct Cervical Spine Wo Contrast  Result Date: 06/08/2017 CLINICAL DATA:  Status post fall with impact to the back of the head. EXAM: CT HEAD WITHOUT CONTRAST CT CERVICAL SPINE WITHOUT CONTRAST TECHNIQUE: Multidetector CT imaging of the head and cervical spine was performed following the standard protocol without intravenous contrast. Multiplanar CT image reconstructions of the cervical spine were also generated. COMPARISON:  06/07/2017 FINDINGS: CT HEAD FINDINGS Brain: No evidence of acute infarction, hemorrhage, hydrocephalus, extra-axial collection or mass lesion/mass effect. Advanced for age brain parenchymal atrophy and periventricular microangiopathy. Prior right basal ganglia and cerebellar lacunar infarcts. Vascular: Calcific atherosclerotic disease at the skullbase. Skull: Normal. Negative for fracture or focal lesion. Sinuses/Orbits: No acute finding. Other: None. CT CERVICAL SPINE FINDINGS Alignment: Normal. Skull base and  vertebrae: No acute fracture. No primary bone lesion or focal pathologic process. Soft tissues and spinal canal: No prevertebral fluid or swelling. No visible canal hematoma. Disc levels:  Multilevel osteoarthritic changes. Upper chest: 1.3 cm soft tissue mass in the right upper lobe. Probable bilateral small pleural effusions. Other: Marked enlargement of the right thyroid gland measuring 5.4 by 5.4 by 8.1 cm. Right anterior chain cervical lymphadenopathy. IMPRESSION: No acute intracranial abnormality. Advanced for age brain parenchymal atrophy and chronic microvascular disease. Prior right basal ganglia and cerebellar lacunar infarcts. No evidence of acute traumatic injury to the cervical spine. Multilevel osteoarthritic changes of the cervical spine. 1.3 cm soft tissue mass in the right upper lobe, suspicious for pulmonary primary or metastatic malignancy. Marked enlargement of the right thyroid gland measuring up to 8.1 cm. Right anterior chain cervical lymphadenopathy. Electronically Signed   By: Ted Mcalpine M.D.   On: 06/08/2017 19:44   Mr Brain Wo Contrast  Result Date: 06/09/2017 CLINICAL DATA:  Concern for stroke.  Encephalopathy. EXAM: MRI HEAD WITHOUT CONTRAST MRA HEAD WITHOUT CONTRAST TECHNIQUE: Multiplanar, multiecho pulse sequences of the brain and surrounding structures were obtained without intravenous contrast. Angiographic images of the head were obtained using MRA technique without contrast. COMPARISON:  Head CT 06/08/2017 Brain MRI 02/09/2012 and 10/27/2010 Carotid ultrasound 10/27/2010 CT neck 12/05/2012 FINDINGS: The study is severely degraded by motion, despite efforts to reduce this artifact, including utilization of motion-resistant MR sequences. The findings of the study are interpreted in the context of reduced sensitivity/specificity. MRI HEAD FINDINGS Brain: The midline structures are normal. No focal diffusion restriction to indicate acute infarct. No intraparenchymal  hemorrhage. There is severe atrophy. There are multiple old cerebellar and supratentorial infarcts. No midline shift or mass lesion. There is Wallerian degeneration of the right cerebral peduncle. No chronic microhemorrhage or cerebral amyloid angiopathy. No hydrocephalus, age advanced atrophy or lobar predominant volume loss. No dural abnormality or extra-axial collection. Skull and upper cervical spine: The visualized skull base, calvarium, upper cervical spine and extracranial soft tissues are normal. Sinuses/Orbits: No fluid levels or advanced mucosal thickening. No mastoid effusion. Normal orbits. MRA HEAD FINDINGS Intracranial internal carotid arteries: There is no flow related enhancement seen within the right internal carotid artery. On prior brain MRI  studies, the right ICA flow void was abnormal and is suspected to be chronic, particularly given the severe volume loss of the right MCA territory. The left ICA is patent. Anterior cerebral arteries: Normal bilateral a 2 segments. Minimal enhancement of right A1 segment. Middle cerebral arteries: Normal left MCA. Minimal to no flow related enhancement of the right MCA. Posterior communicating arteries: Present bilaterally. Posterior cerebral arteries: Limited visualization due to motion, but patent proximally. Poor visualization of the posterior circulation below the level of the posterior cerebral arteries. IMPRESSION: 1. Severely motion degraded examination. Within that limitation, there is no acute ischemia seen on diffusion-weighted imaging. 2. Severely motion degraded intracranial MRA. Occlusion of the right internal carotid artery with little to no flow related enhancement seen in the right middle cerebral artery. This is a chronic occlusion, as demonstrated on prior MRI and CT studies. 3. Minimal appreciable flow related enhancement within the basilar artery, with visualization severely limited by a motion. The basilar artery with severely diminutive on  the CT neck performed 12/05/2012. With this degree of motion, it is not possible to determine whether the basilar artery is patent. 4. 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 Electronically Signed   By: Deatra Robinson M.D.   On: 06/09/2017 06:58   US Carotid Bilateral (at [facility] Only)  Result Date: 06/09/2017 CLINICAL DATA:  Altered mental status. EXAM: BILATERAL CAROTID DUPLEX ULTRASOUND TECHNIQUE: Higinbotham scale imaging, color Doppler and duplex ultrasound were performed of bilateral carotid and vertebral arteries in the neck. COMPARISON:  MRI 06/09/2017. Head CT 06/08/2017. Neck CT 11/25/2012 . Thyroid ultrasound 04/14/2013 FINDINGS: Criteria: Quantification of carotid stenosis is based on velocity parameters that correlate the residual internal carotid diameter with NASCET-based stenosis levels, using the diameter of the distal internal carotid lumen as the denominator for stenosis measurement. The following velocity measurements were obtained: RIGHT ICA:  Complete occlusion. CCA:  120/14 cm/sec ECA:  136 cm/sec LEFT ICA:  173/35 cm/sec CCA:  119/15 cm/sec SYSTOLIC ICA/CCA RATIO:  1.5 DIASTOLIC ICA/CCA RATIO:  2.4 ECA:  98 cm/sec RIGHT CAROTID ARTERY: Diffuse mild right common carotid and carotid bifurcation disease noted. Complete occlusion of the right internal carotid artery appears to be present. RIGHT VERTEBRAL ARTERY:  Patent with antegrade flow. LEFT CAROTID ARTERY: Moderate left common carotid and carotid bifurcation atherosclerotic vascular plaque. LEFT VERTEBRAL ARTERY:  Patent with antegrade flow. Complex cystic mass incidentally noted in the right thyroid gland. Patient had a thyroid ultrasound on 04/14/2013 was abnormal. Repeat thyroid ultrasound should be considered for continued evaluation. IMPRESSION: 1.  Complete occlusion of the right ICA. 2. Moderate left carotid bifurcation atherosclerotic vascular disease with degree of stenosis 50- 69%. Given the  probable complete occlusion of the right ICA and severity of the disease at the left carotid bifurcation, patient may benefit from carotid CTA. 3. Vertebrals are patent with antegrade flow. 4. Complex cystic mass right thyroid gland. Patient had a abnormal thyroid ultrasound 04/14/2013. Repeat thyroid ultrasound should be considered for continued evaluation. Electronically Signed   By: Maisie Fus  Register   On: 06/09/2017 11:04   Mr Maxine Glenn Head Wo Contrast  Result Date: 06/09/2017 CLINICAL DATA:  Concern for stroke.  Encephalopathy. EXAM: MRI HEAD WITHOUT CONTRAST MRA HEAD WITHOUT CONTRAST TECHNIQUE: Multiplanar, multiecho pulse sequences of the brain and surrounding structures were obtained without intravenous contrast. Angiographic images of the head were obtained using MRA technique without contrast. COMPARISON:  Head CT 06/08/2017 Brain MRI 02/09/2012 and 10/27/2010 Carotid ultrasound 10/27/2010  CT neck 12/05/2012 FINDINGS: The study is severely degraded by motion, despite efforts to reduce this artifact, including utilization of motion-resistant MR sequences. The findings of the study are interpreted in the context of reduced sensitivity/specificity. MRI HEAD FINDINGS Brain: The midline structures are normal. No focal diffusion restriction to indicate acute infarct. No intraparenchymal hemorrhage. There is severe atrophy. There are multiple old cerebellar and supratentorial infarcts. No midline shift or mass lesion. There is Wallerian degeneration of the right cerebral peduncle. No chronic microhemorrhage or cerebral amyloid angiopathy. No hydrocephalus, age advanced atrophy or lobar predominant volume loss. No dural abnormality or extra-axial collection. Skull and upper cervical spine: The visualized skull base, calvarium, upper cervical spine and extracranial soft tissues are normal. Sinuses/Orbits: No fluid levels or advanced mucosal thickening. No mastoid effusion. Normal orbits. MRA HEAD FINDINGS  Intracranial internal carotid arteries: There is no flow related enhancement seen within the right internal carotid artery. On prior brain MRI studies, the right ICA flow void was abnormal and is suspected to be chronic, particularly given the severe volume loss of the right MCA territory. The left ICA is patent. Anterior cerebral arteries: Normal bilateral a 2 segments. Minimal enhancement of right A1 segment. Middle cerebral arteries: Normal left MCA. Minimal to no flow related enhancement of the right MCA. Posterior communicating arteries: Present bilaterally. Posterior cerebral arteries: Limited visualization due to motion, but patent proximally. Poor visualization of the posterior circulation below the level of the posterior cerebral arteries. IMPRESSION: 1. Severely motion degraded examination. Within that limitation, there is no acute ischemia seen on diffusion-weighted imaging. 2. Severely motion degraded intracranial MRA. Occlusion of the right internal carotid artery with little to no flow related enhancement seen in the right middle cerebral artery. This is a chronic occlusion, as demonstrated on prior MRI and CT studies. 3. Minimal appreciable flow related enhancement within the basilar artery, with visualization severely limited by a motion. The basilar artery with severely diminutive on the CT neck performed 12/05/2012. With this degree of motion, it is not possible to determine whether the basilar artery is patent. 4. 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 Electronically Signed   By: Deatra Robinson M.D.   On: 06/09/2017 06:58     ASSESSMENT AND PLAN:   76 y.o. male with a known history of BPH, Afib, CKD3, GERD, COPD, GERD, HLD, HTN,  PVD, CVA, seizures presents to the emergency department for evaluation of back pain s/p fall and found to have on chest x-ray bilateral large patchy areas of airspace disease.   1.  sepsis with leukocytosis and tachypnea due to  Community-acquired pneumonia: Continue Levaquin and follow up on final blood cultures  2.  Large right hypodense thyroid mass is noted which is significantly increased in size compared to prior exam concerning for malignancy. This results in significant displacement of the trachea to the left. 3.2 cm left hilar lymph node is noted concerning for metastatic disease. Also noted are multiple pulmonary nodules in both lungs with the largest measuring approximately 8 cm in the right middle lobe. These are concerning for metastatic disease is well  According to the son this has been followed by ENT. Son is unaware of the metastatic process as per CT results. Palliative care and oncology consultation requested Patient is high risk for biopsy  3. Recent falls and back pain: Neurological workup including MRI/MRA of the brain shows no acute pathology. He has known occlusion of the right carotid artery. Order lumbar spine x-ray due  to back pain  4. History of CVA with left-sided neglect: Continue aspirin and statin  5. BPH: Continue finasteride and tamsulosin  6. Chronic kidney disease stage III: Creatinine at baseline  7. PAF: Continue diltiazem for heart rate control  8. Essential hypertension: Continue lisinopril and diltiazem  9. History of seizures: Continue Dilantin  10. COPD without exacerbation: Continue inhalers  Management plans discussed with the patient's son and he is in agreement.  CODE STATUS: FULL  TOTAL TIME TAKING CARE OF THIS PATIENT: 30 minutes.     POSSIBLE D/C 2-3 days, DEPENDING ON CLINICAL CONDITION.   Tyronza Happe M.D on 06/09/2017 at 11:19 AM  Between 7am to 6pm - Pager - [phone] After 6pm go to [email or web address] - [email or web address]  [facility]  Office  [phone]  CC: Primary care physician; Jerl Mina, MD  Note: This dictation was prepared with Dragon dictation along with smaller phrase technology. Any transcriptional errors that  result from this process are unintentional.

## 2017-06-09 NOTE — NC FL2 (Signed)
[place] LEVEL OF CARE SCREENING TOOL     IDENTIFICATION  Patient Name: Ian Townsend Birthdate: Jun 22, 1941 Sex: male Admission Date (Current Location): 06/08/2017  [place] and [place] Number:  Selena Lesser  ([id number]) Facility and Address:  [facility], [address] [place], [place] [address]      Provider Number: [id number]  Attending Physician Name and Address:  Bettey Costa, MD  Relative Name and Phone Number:       Current Level of Care: Hospital Recommended Level of Care: [place] Prior Approval Number:    Date Approved/Denied:   PASRR Number: [id number]   Discharge Plan: SNF   Current Diagnoses: Patient Active Problem List   Diagnosis Date Noted  . Community acquired pneumonia 06/08/2017  . Chronic atrial fibrillation ([place]) 04/11/2015  . Acute on chronic respiratory failure with hypoxemia ([place]) 03/13/2015  . Atrial fibrillation with RVR ([place])   . COPD with acute exacerbation ([place])   . Hypertension   . Preop cardiovascular exam 10/21/2012  . SSS (sick sinus syndrome) ([place]) 03/29/2012  . Syncope 03/29/2012  . Dizziness 03/29/2012  . Bilateral inguinal hernia 06/11/2011    Orientation RESPIRATION BLADDER Height & Weight     Self, Situation  Normal Continent Weight: 140 lb (63.5 kg) Height:  5\' 6"  (167.6 cm)  BEHAVIORAL SYMPTOMS/MOOD NEUROLOGICAL BOWEL NUTRITION STATUS      Continent Diet (Diet: Heart Healthy )  AMBULATORY STATUS COMMUNICATION OF NEEDS Skin   Limited Assist Verbally Normal                       Personal Care Assistance Level of Assistance  Bathing, Feeding, Dressing Bathing Assistance: Limited assistance Feeding assistance: Independent Dressing Assistance: Limited assistance     Functional Limitations Info  Sight, Hearing, Speech Sight Info: Adequate Hearing Info: Adequate Speech Info: Adequate    SPECIAL CARE FACTORS FREQUENCY                        Contractures      Additional Factors Info  Code Status, Allergies Code Status Info:  (Full Code. ) Allergies Info:  (Penicillins )           Current Medications (06/09/2017):  This is the current hospital active medication list Current Facility-Administered Medications  Medication Dose Route Frequency Provider Last Rate Last Dose  . 0.9 %  sodium chloride infusion   Intravenous Continuous Hugelmeyer, Alexis, DO 75 mL/hr at 06/09/17 0657    . acetaminophen (TYLENOL) tablet 650 mg  650 mg Oral Q6H PRN Hugelmeyer, Alexis, DO   650 mg at 06/09/17 1130   Or  . acetaminophen (TYLENOL) suppository 650 mg  650 mg Rectal Q6H PRN Hugelmeyer, Alexis, DO      . albuterol (PROVENTIL) (2.5 MG/3ML) 0.083% nebulizer solution 2.5 mg  2.5 mg Nebulization Q6H PRN Hugelmeyer, Alexis, DO      . aspirin EC tablet 81 mg  81 mg Oral Daily Hugelmeyer, Alexis, DO   81 mg at 06/09/17 0936  . atorvastatin (LIPITOR) tablet 20 mg  20 mg Oral Daily Hugelmeyer, Alexis, DO   20 mg at 06/09/17 0936  . bisacodyl (DULCOLAX) EC tablet 5 mg  5 mg Oral Daily PRN Hugelmeyer, Alexis, DO      . cholecalciferol (VITAMIN D) tablet 2,000 Units  2,000 Units Oral Daily Hugelmeyer, Alexis, DO   2,000 Units at 06/09/17 0935  . darifenacin (ENABLEX) 24 hr tablet 7.5 mg  7.5 mg Oral Daily Hugelmeyer, Alexis, DO   7.5 mg at 06/09/17 0936  . diltiazem (CARDIZEM CD) 24 hr capsule 120 mg  120 mg Oral Daily Hugelmeyer, Alexis, DO   120 mg at 06/09/17 0936  . enoxaparin (LOVENOX) injection 40 mg  40 mg Subcutaneous Q24H Hugelmeyer, Alexis, DO      . finasteride (PROSCAR) tablet 5 mg  5 mg Oral Daily Hugelmeyer, Alexis, DO   5 mg at 06/09/17 0936  . FLUoxetine (PROZAC) capsule 20 mg  20 mg Oral Daily Hugelmeyer, Alexis, DO   20 mg at 06/09/17 0936  . gabapentin (NEURONTIN) capsule 100 mg  100 mg Oral QHS Hugelmeyer, Alexis, DO      . hydrALAZINE (APRESOLINE) injection 10 mg  10 mg Intravenous Q6H PRN Mody, Sital, MD      . ipratropium  (ATROVENT) nebulizer solution 0.5 mg  0.5 mg Nebulization Q6H PRN Hugelmeyer, Alexis, DO      . [START ON 06/10/2017] levofloxacin (LEVAQUIN) IVPB 750 mg  750 mg Intravenous Q48H Pyreddy, Pavan, MD      . lisinopril (PRINIVIL,ZESTRIL) tablet 20 mg  20 mg Oral Daily Hugelmeyer, Alexis, DO   20 mg at 06/09/17 0935  . magnesium citrate solution 1 Bottle  1 Bottle Oral Once PRN Hugelmeyer, Alexis, DO      . mometasone-formoterol (DULERA) 200-5 MCG/ACT inhaler 2 puff  2 puff Inhalation BID Hugelmeyer, Alexis, DO   2 puff at 06/09/17 0935  . ondansetron (ZOFRAN) tablet 4 mg  4 mg Oral Q6H PRN Hugelmeyer, Alexis, DO       Or  . ondansetron (ZOFRAN) injection 4 mg  4 mg Intravenous Q6H PRN Hugelmeyer, Alexis, DO      . oxyCODONE (Oxy IR/ROXICODONE) immediate release tablet 5 mg  5 mg Oral Q4H PRN Hugelmeyer, Alexis, DO      . pantoprazole (PROTONIX) EC tablet 40 mg  40 mg Oral Daily Hugelmeyer, Alexis, DO   40 mg at 06/09/17 0936  . phenytoin (DILANTIN) ER capsule 100 mg  100 mg Oral Daily Hugelmeyer, Alexis, DO   100 mg at 06/09/17 0936  . polyethylene glycol (MIRALAX / GLYCOLAX) packet 17 g  17 g Oral Daily Hugelmeyer, Alexis, DO      . senna-docusate (Senokot-S) tablet 1 tablet  1 tablet Oral QHS PRN Hugelmeyer, Alexis, DO      . sodium chloride flush (NS) 0.9 % injection 3 mL  3 mL Intravenous Q12H Hugelmeyer, Alexis, DO   3 mL at 06/09/17 1046  . tamsulosin (FLOMAX) capsule 0.4 mg  0.4 mg Oral Daily Hugelmeyer, Alexis, DO   0.4 mg at 06/09/17 1610     Discharge Medications: Please see discharge summary for a list of discharge medications.  Relevant Imaging Results:  Relevant Lab Results:   Additional Information  (SSN: [id number])   Palliative Care to follow at SNF.   Sample, Veronia Beets, LCSW

## 2017-06-09 NOTE — Consult Note (Signed)
Reason for Consult:back pain  Referring Physician: Dr. mody  CC: back pain   HPI: Ian Townsend is an 76 y.o. male with a known history of BPH, Afib, CKD3, GERD, COPD, GERD, HLD, HTN,  PVD, CVA, seizures presents to the emergency department for evaluation of back pain s/p fall.  This is his second fall in two days.  s/p mechanical fall and hit the back of his head, no LOC. On CXR was done revealing Bilateral large patchy areas of airspace consolidation versus pulmonary masses.  CT revealed large thyroid mass with tracheal deviation, multiple pulmonary nodules concerning for metastases.     Past Medical History:  Diagnosis Date  . Benign prostatic hypertrophy   . Broken arm    left  . Chronic atrial fibrillation (HCC)    a. not on long term anticoagulation 2/2 high risk fall and multiple falls  . Chronic kidney disease    a. stage III; b. baseline SCr approx 1.50  . COPD (chronic obstructive pulmonary disease) ([place])   . Esophageal reflux   . Hyperlipidemia   . Hypertension   . 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 Mitral regurgitation    a. echo 2013: EF 65%, mod MR/TR, LA 4.4 cm, mod dilated RA  . 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 PVD (peripheral vascular disease) ([place])   . Seasonal allergies   . Seizures ([place])    a. at time of last stroke  . Stroke [facility])    a. x2, last approx 2011  . Syncope   . Unresponsiveness 02/08/2012    Past Surgical History:  Procedure Laterality Date  . COLONOSCOPY  2008   polyps  . ESOPHAGOGASTRODUODENOSCOPY N/A 02/21/2015   Procedure: ESOPHAGOGASTRODUODENOSCOPY (EGD);  Surgeon: Lucilla Lame, MD;  Location: [facility];  Service: Gastroenterology;  Laterality: N/A;  . FRACTURE SURGERY     ankle  . HERNIA REPAIR Bilateral 07-28-2013   inguinal  . UPPER GASTROINTESTINAL ENDOSCOPY  2008    Family History  Problem Relation Age of Onset  . Diabetes Mellitus II Mother     Social History:  reports that he has  been smoking Cigars and Cigarettes.  He has a 12.50 pack-year smoking history. He has never used smokeless tobacco. He reports that he does not drink alcohol or use drugs.  Allergies  Allergen Reactions  . Penicillins Hives         Medications: I have reviewed the patient's current medications.  ROS: Unable to obtain due to periods of confusion   Physical Examination: Blood pressure (!) 169/74, pulse 97, temperature 98.6 F (37 C), resp. rate 20, height 5\' 6"  (1.676 m), weight 63.5 kg (140 lb), SpO2 94 %.   Neurological Examination   Mental Status: Alert, oriented to name only  Cranial Nerves: II: Discs flat bilaterally; Visual fields grossly normal, pupils equal, round, reactive to light and accommodation III,IV, VI: ptosis not present, extra-ocular motions intact bilaterally V,VII: L facial droop VIII: hearing normal bilaterally IX,X: gag reflex present XI: bilateral shoulder shrug XII: midline tongue extension Motor: Right : Upper extremity   5/5    Left:     Upper extremity   4/5  Lower extremity   5/5     Lower extremity   5/5 Tone and bulk:normal tone throughout; no atrophy noted Sensory: Pinprick and light touch intact throughout, bilaterally Deep Tendon Reflexes: 1+ and symmetric throughout Plantars: Right: downgoing  Left: downgoing Cerebellar: Not tested Gait: not tested      Laboratory Studies:   Basic Metabolic Panel:  Recent Labs Lab 06/08/17 2024 06/09/17 0424  NA 136 138  K 3.7 3.9  CL 101 103  CO2 26 26  GLUCOSE 132* 142*  BUN 22* 20  CREATININE 1.61* 1.43*  CALCIUM 10.0 9.9    Liver Function Tests: No results for input(s): AST, ALT, ALKPHOS, BILITOT, PROT, ALBUMIN in the last 168 hours. No results for input(s): LIPASE, AMYLASE in the last 168 hours. No results for input(s): AMMONIA in the last 168 hours.  CBC:  Recent Labs Lab 06/08/17 2024 06/09/17 0424  WBC 30.0* 29.6*  NEUTROABS 26.4*  --   HGB 12.4* 12.3*  HCT 36.1*  35.9*  MCV 88.4 88.5  PLT 255 266    Cardiac Enzymes:  Recent Labs Lab 06/08/17 2024  TROPONINI <0.03    BNP: Invalid input(s): POCBNP  CBG: No results for input(s): GLUCAP in the last 168 hours.  Microbiology: Results for orders placed or performed during the hospital encounter of 06/08/17  Culture, blood (routine x 2)     Status: None (Preliminary result)   Collection Time: 06/08/17  9:12 PM  Result Value Ref Range Status   Specimen Description BLOOD RAC  Final   Special Requests   Final    BOTTLES DRAWN AEROBIC AND ANAEROBIC Blood Culture adequate volume   Culture NO GROWTH < 12 HOURS  Final   Report Status PENDING  Incomplete  Culture, blood (routine x 2)     Status: None (Preliminary result)   Collection Time: 06/08/17  9:12 PM  Result Value Ref Range Status   Specimen Description BLOOD LFOA  Final   Special Requests   Final    BOTTLES DRAWN AEROBIC AND ANAEROBIC Blood Culture adequate volume   Culture NO GROWTH < 12 HOURS  Final   Report Status PENDING  Incomplete  Culture, sputum-assessment     Status: None   Collection Time: 06/09/17  4:17 AM  Result Value Ref Range Status   Specimen Description EXPECTORATED SPUTUM  Final   Special Requests Normal  Final   Sputum evaluation THIS SPECIMEN IS ACCEPTABLE FOR SPUTUM CULTURE  Final   Report Status 06/09/2017 FINAL  Final  MRSA PCR Screening     Status: None   Collection Time: 06/09/17  4:17 AM  Result Value Ref Range Status   MRSA by PCR NEGATIVE NEGATIVE Final    Comment:        The GeneXpert MRSA Assay (FDA approved for NASAL specimens only), is one component of a comprehensive MRSA colonization surveillance program. It is not intended to diagnose MRSA infection nor to guide or monitor treatment for MRSA infections.     Coagulation Studies: No results for input(s): LABPROT, INR in the last 72 hours.  Urinalysis:  Recent Labs Lab 06/08/17 2113  COLORURINE YELLOW*  LABSPEC 1.023  PHURINE 5.0   GLUCOSEU 50*  HGBUR NEGATIVE  BILIRUBINUR NEGATIVE  KETONESUR 5*  PROTEINUR 100*  NITRITE NEGATIVE  LEUKOCYTESUR NEGATIVE    Lipid Panel:     Component Value Date/Time   CHOL 118 06/09/2017 0424   CHOL 104 02/09/2012 0454   TRIG 132 06/09/2017 0424   TRIG 66 02/09/2012 0454   HDL 21 (L) 06/09/2017 0424   HDL 57 02/09/2012 0454   CHOLHDL 5.6 06/09/2017 0424   VLDL 26 06/09/2017 0424   VLDL 13 02/09/2012 0454   LDLCALC 71 06/09/2017 0424  [place] 34 02/09/2012 0454    HgbA1C:  Lab Results  Component Value Date   HGBA1C  09/17/2010    4.9 (NOTE)                                                                       According to the ADA Clinical Practice Recommendations for 2011, when HbA1c is used as a screening test:   >=6.5%   Diagnostic of Diabetes Mellitus           (if abnormal result  is confirmed)  5.7-6.4%   Increased risk of developing Diabetes Mellitus  References:Diagnosis and Classification of Diabetes Mellitus,Diabetes BTDV,7616,07(PXTGG 1):S62-S69 and Standards of Medical Care in         Diabetes - 2011,Diabetes YIRS,8546,27  (Suppl 1):S11-S61.    Urine Drug Screen:     Component Value Date/Time   LABOPIA NEGATIVE 09/16/2010 0500   LABOPIA NONE DETECTED 12/24/2008 1658   COCAINSCRNUR NEGATIVE 09/16/2010 0500   LABBENZ NEGATIVE 09/16/2010 0500   AMPHETMU NEGATIVE 09/16/2010 0500   AMPHETMU NONE DETECTED 12/24/2008 1658   THCU NONE DETECTED 12/24/2008 1658   LABBARB  12/24/2008 1658    NONE DETECTED        DRUG SCREEN FOR MEDICAL PURPOSES ONLY.  IF CONFIRMATION IS NEEDED FOR ANY PURPOSE, NOTIFY LAB WITHIN 5 DAYS.        LOWEST DETECTABLE LIMITS FOR URINE DRUG SCREEN Drug Class       Cutoff (ng/mL) Amphetamine      1000 Barbiturate      200 Benzodiazepine   035 Tricyclics       009 Opiates          300 Cocaine          300 THC              50    Alcohol Level: No results for input(s): ETH in the last 168 hours.  Other results: EKG:  afib.  Imaging: Dg Chest 2 View  Result Date: 06/08/2017 CLINICAL DATA:  Status post fall. History of hypertension, AFib and mitral regurgitation. EXAM: CHEST  2 VIEW COMPARISON:  03/13/2015 FINDINGS: Cardiomediastinal silhouette is normal. Mediastinal contours appear intact. Calcific atherosclerotic disease of the aorta. There is no evidence of pleural effusion or pneumothorax. Bilateral large patchy areas of airspace consolidation versus pulmonary masses. Osseous structures are without acute abnormality. Soft tissues are grossly normal. IMPRESSION: Bilateral large patchy areas of airspace consolidation versus pulmonary masses. Further evaluation with chest CT, preferably with contrast, is recommended. Electronically Signed   By: Fidela Salisbury M.D.   On: 06/08/2017 19:33   Dg Lumbar Spine 2-3 Views  Result Date: 06/09/2017 CLINICAL DATA:  Back pain, recent falls. EXAM: LUMBAR SPINE - 2-3 VIEW COMPARISON:  CT abdomen and pelvis dated July 13, 2013. Pelvic x-rays dated August 05, 2012. FINDINGS: There are 5 lumbar type vertebral bodies. There is no definite lumbar spinal fracture. Vertebral body heights are maintained. Alignment is normal. Slight superior endplate concavity of the L4 vertebral body is unchanged. Mild multilevel degenerative changes from L3-L4 through L5-S1. Diffuse osteopenia. Atherosclerotic calcifications of the abdominal aorta. Contrast within the bladder. IMPRESSION: Mild lower lumbar spine degenerative changes. Electronically Signed   By: Titus Dubin  M.D.   On: 06/09/2017 12:49   Ct Head Wo Contrast  Result Date: 06/08/2017 CLINICAL DATA:  Status post fall with impact to the back of the head. EXAM: CT HEAD WITHOUT CONTRAST CT CERVICAL SPINE WITHOUT CONTRAST TECHNIQUE: Multidetector CT imaging of the head and cervical spine was performed following the standard protocol without intravenous contrast. Multiplanar CT image reconstructions of the cervical spine were also  generated. COMPARISON:  06/07/2017 FINDINGS: CT HEAD FINDINGS Brain: No evidence of acute infarction, hemorrhage, hydrocephalus, extra-axial collection or mass lesion/mass effect. Advanced for age brain parenchymal atrophy and periventricular microangiopathy. Prior right basal ganglia and cerebellar lacunar infarcts. Vascular: Calcific atherosclerotic disease at the skullbase. Skull: Normal. Negative for fracture or focal lesion. Sinuses/Orbits: No acute finding. Other: None. CT CERVICAL SPINE FINDINGS Alignment: Normal. Skull base and vertebrae: No acute fracture. No primary bone lesion or focal pathologic process. Soft tissues and spinal canal: No prevertebral fluid or swelling. No visible canal hematoma. Disc levels:  Multilevel osteoarthritic changes. Upper chest: 1.3 cm soft tissue mass in the right upper lobe. Probable bilateral small pleural effusions. Other: Marked enlargement of the right thyroid gland measuring 5.4 by 5.4 by 8.1 cm. Right anterior chain cervical lymphadenopathy. IMPRESSION: No acute intracranial abnormality. Advanced for age brain parenchymal atrophy and chronic microvascular disease. Prior right basal ganglia and cerebellar lacunar infarcts. No evidence of acute traumatic injury to the cervical spine. Multilevel osteoarthritic changes of the cervical spine. 1.3 cm soft tissue mass in the right upper lobe, suspicious for pulmonary primary or metastatic malignancy. Marked enlargement of the right thyroid gland measuring up to 8.1 cm. Right anterior chain cervical lymphadenopathy. Electronically Signed   By: Fidela Salisbury M.D.   On: 06/08/2017 19:44   Ct Head Wo Contrast  Result Date: 06/07/2017 CLINICAL DATA:  Fall EXAM: CT HEAD WITHOUT CONTRAST TECHNIQUE: Contiguous axial images were obtained from the base of the skull through the vertex without intravenous contrast. COMPARISON:  Head CT 03/16/2017 FINDINGS: Brain: No mass lesion, intraparenchymal hemorrhage or extra-axial  collection. No evidence of acute cortical infarct. There is periventricular hypoattenuation compatible with chronic microvascular disease. Old right basal ganglia lacunar infarct. Age advanced cerebral and cerebellar atrophy with ventriculomegaly. Vascular: Atherosclerotic calcification of the vertebral and internal carotid arteries at the skull base. Skull: Normal visualized skull base, calvarium and extracranial soft tissues. Sinuses/Orbits: No sinus fluid levels or advanced mucosal thickening. No mastoid effusion. Normal orbits. IMPRESSION: 1. No acute intracranial abnormality. 2. Age advanced atrophy, chronic hypertensive microangiopathy and old right basal ganglia lacunar infarct. Electronically Signed   By: Ulyses Jarred M.D.   On: 06/07/2017 18:38   Ct Chest W Contrast  Result Date: 06/08/2017 CLINICAL DATA:  Cough, unwitnessed fall. EXAM: CT CHEST WITH CONTRAST TECHNIQUE: Multidetector CT imaging of the chest was performed during intravenous contrast administration. CONTRAST:  28mL ISOVUE-300 IOPAMIDOL (ISOVUE-300) INJECTION 61% COMPARISON:  Radiographs of same day.  CT scan of September 22, 2012. FINDINGS: Cardiovascular: Atherosclerosis of thoracic aorta is noted without aneurysm or dissection. Coronary artery calcifications are noted suggesting coronary artery disease. No pericardial effusion is noted. Mediastinum/Nodes: Large right hypodense thyroid mass is noted which is significantly increased in size compared to prior exam, concerning for malignancy. This results in significant displacement of the trachea to the left. 3.2 cm left hilar lymph node is noted. Moderate size hiatal hernia is noted. Lungs/Pleura: Mild bilateral pleural effusions are noted. Multiple pulmonary masses are noted bilaterally consistent with metastatic disease. The largest on the left measures  7.3 cm posteriorly in the left lower lobe. The largest on the right measures 8.4 cm anteriorly in the right middle lobe. No pneumothorax  is noted. Upper Abdomen: No acute abnormality. Musculoskeletal: No chest wall abnormality. No acute or significant osseous findings. IMPRESSION: Large right hypodense thyroid mass is noted which is significantly increased in size compared to prior exam concerning for malignancy. This results in significant displacement of the trachea to the left. 3.2 cm left hilar lymph node is noted concerning for metastatic disease. Also noted are multiple pulmonary nodules in both lungs with the largest measuring approximately 8 cm in the right middle lobe. These are concerning for metastatic disease is well. Coronary artery calcifications are noted suggesting coronary artery disease. Mild bilateral pleural effusions are noted. Moderate size hiatal hernia is noted. Aortic Atherosclerosis (ICD10-I70.0). Electronically Signed   By: Marijo Conception, M.D.   On: 06/08/2017 22:01   Ct Cervical Spine Wo Contrast  Result Date: 06/08/2017 CLINICAL DATA:  Status post fall with impact to the back of the head. EXAM: CT HEAD WITHOUT CONTRAST CT CERVICAL SPINE WITHOUT CONTRAST TECHNIQUE: Multidetector CT imaging of the head and cervical spine was performed following the standard protocol without intravenous contrast. Multiplanar CT image reconstructions of the cervical spine were also generated. COMPARISON:  06/07/2017 FINDINGS: CT HEAD FINDINGS Brain: No evidence of acute infarction, hemorrhage, hydrocephalus, extra-axial collection or mass lesion/mass effect. Advanced for age brain parenchymal atrophy and periventricular microangiopathy. Prior right basal ganglia and cerebellar lacunar infarcts. Vascular: Calcific atherosclerotic disease at the skullbase. Skull: Normal. Negative for fracture or focal lesion. Sinuses/Orbits: No acute finding. Other: None. CT CERVICAL SPINE FINDINGS Alignment: Normal. Skull base and vertebrae: No acute fracture. No primary bone lesion or focal pathologic process. Soft tissues and spinal canal: No  prevertebral fluid or swelling. No visible canal hematoma. Disc levels:  Multilevel osteoarthritic changes. Upper chest: 1.3 cm soft tissue mass in the right upper lobe. Probable bilateral small pleural effusions. Other: Marked enlargement of the right thyroid gland measuring 5.4 by 5.4 by 8.1 cm. Right anterior chain cervical lymphadenopathy. IMPRESSION: No acute intracranial abnormality. Advanced for age brain parenchymal atrophy and chronic microvascular disease. Prior right basal ganglia and cerebellar lacunar infarcts. No evidence of acute traumatic injury to the cervical spine. Multilevel osteoarthritic changes of the cervical spine. 1.3 cm soft tissue mass in the right upper lobe, suspicious for pulmonary primary or metastatic malignancy. Marked enlargement of the right thyroid gland measuring up to 8.1 cm. Right anterior chain cervical lymphadenopathy. Electronically Signed   By: Fidela Salisbury M.D.   On: 06/08/2017 19:44   Mr Brain Wo Contrast  Result Date: 06/09/2017 CLINICAL DATA:  Concern for stroke.  Encephalopathy. EXAM: MRI HEAD WITHOUT CONTRAST MRA HEAD WITHOUT CONTRAST TECHNIQUE: Multiplanar, multiecho pulse sequences of the brain and surrounding structures were obtained without intravenous contrast. Angiographic images of the head were obtained using MRA technique without contrast. COMPARISON:  Head CT 06/08/2017 Brain MRI 02/09/2012 and 10/27/2010 Carotid ultrasound 10/27/2010 CT neck 12/05/2012 FINDINGS: The study is severely degraded by motion, despite efforts to reduce this artifact, including utilization of motion-resistant MR sequences. The findings of the study are interpreted in the context of reduced sensitivity/specificity. MRI HEAD FINDINGS Brain: The midline structures are normal. No focal diffusion restriction to indicate acute infarct. No intraparenchymal hemorrhage. There is severe atrophy. There are multiple old cerebellar and supratentorial infarcts. No midline shift or  mass lesion. There is Wallerian degeneration of the right cerebral peduncle. No chronic  microhemorrhage or cerebral amyloid angiopathy. No hydrocephalus, age advanced atrophy or lobar predominant volume loss. No dural abnormality or extra-axial collection. Skull and upper cervical spine: The visualized skull base, calvarium, upper cervical spine and extracranial soft tissues are normal. Sinuses/Orbits: No fluid levels or advanced mucosal thickening. No mastoid effusion. Normal orbits. MRA HEAD FINDINGS Intracranial internal carotid arteries: There is no flow related enhancement seen within the right internal carotid artery. On prior brain MRI studies, the right ICA flow void was abnormal and is suspected to be chronic, particularly given the severe volume loss of the right MCA territory. The left ICA is patent. Anterior cerebral arteries: Normal bilateral a 2 segments. Minimal enhancement of right A1 segment. Middle cerebral arteries: Normal left MCA. Minimal to no flow related enhancement of the right MCA. Posterior communicating arteries: Present bilaterally. Posterior cerebral arteries: Limited visualization due to motion, but patent proximally. Poor visualization of the posterior circulation below the level of the posterior cerebral arteries. IMPRESSION: 1. Severely motion degraded examination. Within that limitation, there is no acute ischemia seen on diffusion-weighted imaging. 2. Severely motion degraded intracranial MRA. Occlusion of the right internal carotid artery with little to no flow related enhancement seen in the right middle cerebral artery. This is a chronic occlusion, as demonstrated on prior MRI and CT studies. 3. Minimal appreciable flow related enhancement within the basilar artery, with visualization severely limited by a motion. The basilar artery with severely diminutive on the CT neck performed 12/05/2012. With this degree of motion, it is not possible to determine whether the basilar  artery is patent. 4. 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 Electronically Signed   By: Ulyses Jarred M.D.   On: 06/09/2017 06:58   US Carotid Bilateral (at [facility] Only)  Result Date: 06/09/2017 CLINICAL DATA:  Altered mental status. EXAM: BILATERAL CAROTID DUPLEX ULTRASOUND TECHNIQUE: [place] scale imaging, color Doppler and duplex ultrasound were performed of bilateral carotid and vertebral arteries in the neck. COMPARISON:  MRI 06/09/2017. Head CT 06/08/2017. Neck CT 11/25/2012 . Thyroid ultrasound 04/14/2013 FINDINGS: Criteria: Quantification of carotid stenosis is based on velocity parameters that correlate the residual internal carotid diameter with NASCET-based stenosis levels, using the diameter of the distal internal carotid lumen as the denominator for stenosis measurement. The following velocity measurements were obtained: RIGHT ICA:  Complete occlusion. CCA:  120/14 cm/sec ECA:  136 cm/sec LEFT ICA:  173/35 cm/sec CCA:  884/16 cm/sec SYSTOLIC ICA/CCA RATIO:  1.5 DIASTOLIC ICA/CCA RATIO:  2.4 ECA:  98 cm/sec RIGHT CAROTID ARTERY: Diffuse mild right common carotid and carotid bifurcation disease noted. Complete occlusion of the right internal carotid artery appears to be present. RIGHT VERTEBRAL ARTERY:  Patent with antegrade flow. LEFT CAROTID ARTERY: Moderate left common carotid and carotid bifurcation atherosclerotic vascular plaque. LEFT VERTEBRAL ARTERY:  Patent with antegrade flow. Complex cystic mass incidentally noted in the right thyroid gland. Patient had a thyroid ultrasound on 04/14/2013 was abnormal. Repeat thyroid ultrasound should be considered for continued evaluation. IMPRESSION: 1.  Complete occlusion of the right ICA. 2. Moderate left carotid bifurcation atherosclerotic vascular disease with degree of stenosis 50- 69%. Given the probable complete occlusion of the right ICA and severity of the disease at the left carotid bifurcation, patient may  benefit from carotid CTA. 3. Vertebrals are patent with antegrade flow. 4. Complex cystic mass right thyroid gland. Patient had a abnormal thyroid ultrasound 04/14/2013. Repeat thyroid ultrasound should be considered for continued evaluation. Electronically Signed   By:  Sunol   On: 06/09/2017 11:04   Mr Jodene Nam Head Wo Contrast  Result Date: 06/09/2017 CLINICAL DATA:  Concern for stroke.  Encephalopathy. EXAM: MRI HEAD WITHOUT CONTRAST MRA HEAD WITHOUT CONTRAST TECHNIQUE: Multiplanar, multiecho pulse sequences of the brain and surrounding structures were obtained without intravenous contrast. Angiographic images of the head were obtained using MRA technique without contrast. COMPARISON:  Head CT 06/08/2017 Brain MRI 02/09/2012 and 10/27/2010 Carotid ultrasound 10/27/2010 CT neck 12/05/2012 FINDINGS: The study is severely degraded by motion, despite efforts to reduce this artifact, including utilization of motion-resistant MR sequences. The findings of the study are interpreted in the context of reduced sensitivity/specificity. MRI HEAD FINDINGS Brain: The midline structures are normal. No focal diffusion restriction to indicate acute infarct. No intraparenchymal hemorrhage. There is severe atrophy. There are multiple old cerebellar and supratentorial infarcts. No midline shift or mass lesion. There is Wallerian degeneration of the right cerebral peduncle. No chronic microhemorrhage or cerebral amyloid angiopathy. No hydrocephalus, age advanced atrophy or lobar predominant volume loss. No dural abnormality or extra-axial collection. Skull and upper cervical spine: The visualized skull base, calvarium, upper cervical spine and extracranial soft tissues are normal. Sinuses/Orbits: No fluid levels or advanced mucosal thickening. No mastoid effusion. Normal orbits. MRA HEAD FINDINGS Intracranial internal carotid arteries: There is no flow related enhancement seen within the right internal carotid artery. On  prior brain MRI studies, the right ICA flow void was abnormal and is suspected to be chronic, particularly given the severe volume loss of the right MCA territory. The left ICA is patent. Anterior cerebral arteries: Normal bilateral a 2 segments. Minimal enhancement of right A1 segment. Middle cerebral arteries: Normal left MCA. Minimal to no flow related enhancement of the right MCA. Posterior communicating arteries: Present bilaterally. Posterior cerebral arteries: Limited visualization due to motion, but patent proximally. Poor visualization of the posterior circulation below the level of the posterior cerebral arteries. IMPRESSION: 1. Severely motion degraded examination. Within that limitation, there is no acute ischemia seen on diffusion-weighted imaging. 2. Severely motion degraded intracranial MRA. Occlusion of the right internal carotid artery with little to no flow related enhancement seen in the right middle cerebral artery. This is a chronic occlusion, as demonstrated on prior MRI and CT studies. 3. Minimal appreciable flow related enhancement within the basilar artery, with visualization severely limited by a motion. The basilar artery with severely diminutive on the CT neck performed 12/05/2012. With this degree of motion, it is not possible to determine whether the basilar artery is patent. 4. 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 Electronically Signed   By: Ulyses Jarred M.D.   On: 06/09/2017 06:58     Assessment/Plan:   76 y.o. male with a known history of BPH, Afib, CKD3, GERD, COPD, GERD, HLD, HTN,  PVD, CVA, seizures presents to the emergency department for evaluation of back pain s/p fall.  This is his second fall in two days.  s/p mechanical fall and hit the back of his head, no LOC. On CXR was done revealing Bilateral large patchy areas of airspace consolidation versus pulmonary masses.  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   On examination pt has left lumbar sacral pain that is reproducible to pain. I believe this is likely musculoskeletal and possible from fall.   X ray L spine shows degenerative disk dz Likely progressive motor deficits in setting of possible L sided neglect from prior stroke Pain  control and possible muscle relaxant for back pain and no further imaging Needs likely prolonged PT  06/09/2017, 1:23 PM

## 2017-06-09 NOTE — Progress Notes (Signed)
Patient's son requested that we have all 4 side rails up on the bed. Family member educated

## 2017-06-09 NOTE — Consult Note (Signed)
[place] NOTE  Patient Care Team: Maryland Pink, MD as PCP - General (Family Medicine) Bary Castilla, Forest Gleason, MD (General Surgery)  CHIEF COMPLAINTS/PURPOSE OF CONSULTATION: Thyroid mass/multiple lung masses  HISTORY OF PRESENTING ILLNESS: Please note patient poor historian given acute confusion/multiple strokes. Daughter is the primary historian.  Ian Townsend 76 y.o.  male history of multiple medical problems including multiple strokes/mild dementia/chronic kidney disease multiple falls currently in assisted living- was brought to the hospital after fall.  Workup for a fall including stroke negative for any acute process. However noted to have a large right-sided thyroid mass and multiple lung nodules on a CT of the chest.  As per the daughter patient was noted to have a mass on the right thyroid gland dating back to 2013 when it was evaluated by ENT Dr.Mcqueen. This was thought to be thyroid malignancy; however given his poor performance status multiple comorbidities it was decided to Hold off any further workup. Patient did not have a biopsy.  As per the daughter most recently she is noted to have increasing size of the right thyroid mass. As per the daughter patient has been losing weight. Chronic shortness of breath. Not any worse. Chronic cough. Unfortunately continues to smoke. He goes around in a rolling walker with a seat.  ROS: Difficult to assess given mental status changes.  MEDICAL HISTORY:  Past Medical History:  Diagnosis Date  . Benign prostatic hypertrophy   . Broken arm    left  . Chronic atrial fibrillation (HCC)    a. not on long term anticoagulation 2/2 high risk fall and multiple falls  . Chronic kidney disease    a. stage III; b. baseline SCr approx 1.50  . COPD (chronic obstructive pulmonary disease) ([place])   . Esophageal reflux   . Hyperlipidemia   . Hypertension   . 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 Mitral  regurgitation    a. echo 2013: EF 65%, mod MR/TR, LA 4.4 cm, mod dilated RA  . 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 PVD (peripheral vascular disease) ([place])   . Seasonal allergies   . Seizures ([place])    a. at time of last stroke  . Stroke [facility])    a. x2, last approx 2011  . Syncope   . Unresponsiveness 02/08/2012    SURGICAL HISTORY: Past Surgical History:  Procedure Laterality Date  . COLONOSCOPY  2008   polyps  . ESOPHAGOGASTRODUODENOSCOPY N/A 02/21/2015   Procedure: ESOPHAGOGASTRODUODENOSCOPY (EGD);  Surgeon: Lucilla Lame, MD;  Location: [place] [facility];  Service: Gastroenterology;  Laterality: N/A;  . FRACTURE SURGERY     ankle  . HERNIA REPAIR Bilateral 07-28-2013   inguinal  . UPPER GASTROINTESTINAL ENDOSCOPY  2008    SOCIAL HISTORY: Social History   Social History  . Marital status: Single    Spouse name: N/A  . Number of children: N/A  . Years of education: N/A   Occupational History  . Not on file.   Social History Main Topics  . Smoking status: Current Every Day Smoker    Packs/day: 0.25    Years: 50.00    Types: Cigars, Cigarettes    Last attempt to quit: 08/30/2010  . Smokeless tobacco: Never Used  . Alcohol use No  . Drug use: No  . Sexual activity: Not on file   Other Topics Concern  . Not on file   Social History  Narrative  . No narrative on file    FAMILY HISTORY: Family History  Problem Relation Age of Onset  . Diabetes Mellitus II Mother     ALLERGIES:  is allergic to penicillins.  MEDICATIONS:  Current Facility-Administered Medications  Medication Dose Route Frequency Provider Last Rate Last Dose  . 0.9 %  sodium chloride infusion   Intravenous Continuous Hugelmeyer, Alexis, DO 75 mL/hr at 06/09/17 0657    . acetaminophen (TYLENOL) tablet 650 mg  650 mg Oral Q6H PRN Hugelmeyer, Alexis, DO   650 mg at 06/09/17 1736   Or  . acetaminophen (TYLENOL) suppository 650 mg  650 mg Rectal Q6H PRN Hugelmeyer, Alexis, DO       . albuterol (PROVENTIL) (2.5 MG/3ML) 0.083% nebulizer solution 2.5 mg  2.5 mg Nebulization Q6H PRN Hugelmeyer, Alexis, DO      . ALPRAZolam Duanne Moron) tablet 0.25 mg  0.25 mg Oral TID PRN Henreitta Leber, MD   0.25 mg at 06/09/17 2110  . aspirin EC tablet 81 mg  81 mg Oral Daily Hugelmeyer, Alexis, DO   81 mg at 06/09/17 0936  . atorvastatin (LIPITOR) tablet 20 mg  20 mg Oral Daily Hugelmeyer, Alexis, DO   20 mg at 06/09/17 0936  . bisacodyl (DULCOLAX) EC tablet 5 mg  5 mg Oral Daily PRN Hugelmeyer, Alexis, DO      . cholecalciferol (VITAMIN D) tablet 2,000 Units  2,000 Units Oral Daily Hugelmeyer, Alexis, DO   2,000 Units at 06/09/17 0935  . darifenacin (ENABLEX) 24 hr tablet 7.5 mg  7.5 mg Oral Daily Hugelmeyer, Alexis, DO   7.5 mg at 06/09/17 0936  . diltiazem (CARDIZEM CD) 24 hr capsule 120 mg  120 mg Oral Daily Hugelmeyer, Alexis, DO   120 mg at 06/09/17 0936  . enoxaparin (LOVENOX) injection 40 mg  40 mg Subcutaneous Q24H Hugelmeyer, Alexis, DO   40 mg at 06/09/17 2045  . finasteride (PROSCAR) tablet 5 mg  5 mg Oral Daily Hugelmeyer, Alexis, DO   5 mg at 06/09/17 0936  . FLUoxetine (PROZAC) capsule 20 mg  20 mg Oral Daily Hugelmeyer, Alexis, DO   20 mg at 06/09/17 0936  . gabapentin (NEURONTIN) capsule 100 mg  100 mg Oral QHS Hugelmeyer, Alexis, DO   100 mg at 06/09/17 2045  . hydrALAZINE (APRESOLINE) injection 10 mg  10 mg Intravenous Q6H PRN Mody, Sital, MD      . ipratropium (ATROVENT) nebulizer solution 0.5 mg  0.5 mg Nebulization Q6H PRN Hugelmeyer, Alexis, DO      . [START ON 06/10/2017] levofloxacin (LEVAQUIN) IVPB 750 mg  750 mg Intravenous Q48H Pyreddy, Pavan, MD      . lisinopril (PRINIVIL,ZESTRIL) tablet 20 mg  20 mg Oral Daily Hugelmeyer, Alexis, DO   20 mg at 06/09/17 0935  . magnesium citrate solution 1 Bottle  1 Bottle Oral Once PRN Hugelmeyer, Alexis, DO      . mometasone-formoterol (DULERA) 200-5 MCG/ACT inhaler 2 puff  2 puff Inhalation BID Hugelmeyer, Alexis, DO   2 puff at  06/09/17 2045  . nicotine (NICODERM CQ - dosed in mg/24 hours) patch 14 mg  14 mg Transdermal Daily Henreitta Leber, MD   14 mg at 06/09/17 2045  . ondansetron (ZOFRAN) tablet 4 mg  4 mg Oral Q6H PRN Hugelmeyer, Alexis, DO       Or  . ondansetron (ZOFRAN) injection 4 mg  4 mg Intravenous Q6H PRN Hugelmeyer, Alexis, DO      . oxyCODONE (Oxy  IR/ROXICODONE) immediate release tablet 5 mg  5 mg Oral Q4H PRN Hugelmeyer, Alexis, DO      . pantoprazole (PROTONIX) EC tablet 40 mg  40 mg Oral Daily Hugelmeyer, Alexis, DO   40 mg at 06/09/17 0936  . [START ON 06/10/2017] phenytoin (DILANTIN) chewable tablet 50 mg  50 mg Oral Daily Mody, Sital, MD      . phenytoin (DILANTIN) ER capsule 100 mg  100 mg Oral Daily Hugelmeyer, Alexis, DO   100 mg at 06/09/17 0936  . polyethylene glycol (MIRALAX / GLYCOLAX) packet 17 g  17 g Oral Daily Hugelmeyer, Alexis, DO      . senna-docusate (Senokot-S) tablet 1 tablet  1 tablet Oral QHS PRN Hugelmeyer, Alexis, DO      . sodium chloride flush (NS) 0.9 % injection 3 mL  3 mL Intravenous Q12H Hugelmeyer, Alexis, DO   3 mL at 06/09/17 1046  . tamsulosin (FLOMAX) capsule 0.4 mg  0.4 mg Oral Daily Hugelmeyer, Alexis, DO   0.4 mg at 06/09/17 0935      .  PHYSICAL EXAMINATION:  Vitals:   06/09/17 1615 06/09/17 2034  BP: (!) 170/72 (!) 160/95  Pulse: 88 94  Resp: 20 (!) 24  Temp: 98.4 F (36.9 C) 99.1 F (37.3 C)  SpO2: 96% 95%   Filed Weights   06/08/17 1838  Weight: 140 lb (63.5 kg)    GENERAL: Frail appearing cachectic male patient  Alert, no distress and comfortable.  Accompanied by daughter.  EYES: no pallor or icterus OROPHARYNX: no thrush or ulceration.Poor dentition.  NECK: 5-6 cm neck mass noted on the right side.  LYMPH:  no palpable axillary or inguinal regions LUNGS: decreased breath sounds to auscultation at bases and  No wheeze or crackles HEART/CVS: regular rate & rhythm and no murmurs; No lower extremity edema ABDOMEN: abdomen soft, non-tender  and normal bowel sounds Musculoskeletal:no cyanosis of digits and no clubbing  PSYCH: alert & oriented x 0-1.  NEURO: no focal motor/sensory deficits SKIN:  no rashes or significant lesions  LABORATORY DATA:  I have reviewed the data as listed Lab Results  Component Value Date   WBC 29.6 (H) 06/09/2017   HGB 12.3 (L) 06/09/2017   HCT 35.9 (L) 06/09/2017   MCV 88.5 06/09/2017   PLT 266 06/09/2017    Recent Labs  06/08/17 2024 06/09/17 0424  NA 136 138  K 3.7 3.9  CL 101 103  CO2 26 26  GLUCOSE 132* 142*  BUN 22* 20  CREATININE 1.61* 1.43*  CALCIUM 10.0 9.9  GFRNONAA 40* 46*  GFRAA 46* 53*    RADIOGRAPHIC STUDIES: I have personally reviewed the radiological images as listed and agreed with the findings in the report. Dg Chest 2 View  Result Date: 06/08/2017 CLINICAL DATA:  Status post fall. History of hypertension, AFib and mitral regurgitation. EXAM: CHEST  2 VIEW COMPARISON:  03/13/2015 FINDINGS: Cardiomediastinal silhouette is normal. Mediastinal contours appear intact. Calcific atherosclerotic disease of the aorta. There is no evidence of pleural effusion or pneumothorax. Bilateral large patchy areas of airspace consolidation versus pulmonary masses. Osseous structures are without acute abnormality. Soft tissues are grossly normal. IMPRESSION: Bilateral large patchy areas of airspace consolidation versus pulmonary masses. Further evaluation with chest CT, preferably with contrast, is recommended. Electronically Signed   By: Fidela Salisbury M.D.   On: 06/08/2017 19:33   Dg Lumbar Spine 2-3 Views  Result Date: 06/09/2017 CLINICAL DATA:  Back pain, recent falls. EXAM: LUMBAR SPINE - 2-3  VIEW COMPARISON:  CT abdomen and pelvis dated July 13, 2013. Pelvic x-rays dated August 05, 2012. FINDINGS: There are 5 lumbar type vertebral bodies. There is no definite lumbar spinal fracture. Vertebral body heights are maintained. Alignment is normal. Slight superior endplate  concavity of the L4 vertebral body is unchanged. Mild multilevel degenerative changes from L3-L4 through L5-S1. Diffuse osteopenia. Atherosclerotic calcifications of the abdominal aorta. Contrast within the bladder. IMPRESSION: Mild lower lumbar spine degenerative changes. Electronically Signed   By: Titus Dubin M.D.   On: 06/09/2017 12:49   Ct Head Wo Contrast  Result Date: 06/08/2017 CLINICAL DATA:  Status post fall with impact to the back of the head. EXAM: CT HEAD WITHOUT CONTRAST CT CERVICAL SPINE WITHOUT CONTRAST TECHNIQUE: Multidetector CT imaging of the head and cervical spine was performed following the standard protocol without intravenous contrast. Multiplanar CT image reconstructions of the cervical spine were also generated. COMPARISON:  06/07/2017 FINDINGS: CT HEAD FINDINGS Brain: No evidence of acute infarction, hemorrhage, hydrocephalus, extra-axial collection or mass lesion/mass effect. Advanced for age brain parenchymal atrophy and periventricular microangiopathy. Prior right basal ganglia and cerebellar lacunar infarcts. Vascular: Calcific atherosclerotic disease at the skullbase. Skull: Normal. Negative for fracture or focal lesion. Sinuses/Orbits: No acute finding. Other: None. CT CERVICAL SPINE FINDINGS Alignment: Normal. Skull base and vertebrae: No acute fracture. No primary bone lesion or focal pathologic process. Soft tissues and spinal canal: No prevertebral fluid or swelling. No visible canal hematoma. Disc levels:  Multilevel osteoarthritic changes. Upper chest: 1.3 cm soft tissue mass in the right upper lobe. Probable bilateral small pleural effusions. Other: Marked enlargement of the right thyroid gland measuring 5.4 by 5.4 by 8.1 cm. Right anterior chain cervical lymphadenopathy. IMPRESSION: No acute intracranial abnormality. Advanced for age brain parenchymal atrophy and chronic microvascular disease. Prior right basal ganglia and cerebellar lacunar infarcts. No evidence of  acute traumatic injury to the cervical spine. Multilevel osteoarthritic changes of the cervical spine. 1.3 cm soft tissue mass in the right upper lobe, suspicious for pulmonary primary or metastatic malignancy. Marked enlargement of the right thyroid gland measuring up to 8.1 cm. Right anterior chain cervical lymphadenopathy. Electronically Signed   By: Fidela Salisbury M.D.   On: 06/08/2017 19:44   Ct Head Wo Contrast  Result Date: 06/07/2017 CLINICAL DATA:  Fall EXAM: CT HEAD WITHOUT CONTRAST TECHNIQUE: Contiguous axial images were obtained from the base of the skull through the vertex without intravenous contrast. COMPARISON:  Head CT 03/16/2017 FINDINGS: Brain: No mass lesion, intraparenchymal hemorrhage or extra-axial collection. No evidence of acute cortical infarct. There is periventricular hypoattenuation compatible with chronic microvascular disease. Old right basal ganglia lacunar infarct. Age advanced cerebral and cerebellar atrophy with ventriculomegaly. Vascular: Atherosclerotic calcification of the vertebral and internal carotid arteries at the skull base. Skull: Normal visualized skull base, calvarium and extracranial soft tissues. Sinuses/Orbits: No sinus fluid levels or advanced mucosal thickening. No mastoid effusion. Normal orbits. IMPRESSION: 1. No acute intracranial abnormality. 2. Age advanced atrophy, chronic hypertensive microangiopathy and old right basal ganglia lacunar infarct. Electronically Signed   By: Ulyses Jarred M.D.   On: 06/07/2017 18:38   Ct Chest W Contrast  Result Date: 06/08/2017 CLINICAL DATA:  Cough, unwitnessed fall. EXAM: CT CHEST WITH CONTRAST TECHNIQUE: Multidetector CT imaging of the chest was performed during intravenous contrast administration. CONTRAST:  46mL ISOVUE-300 IOPAMIDOL (ISOVUE-300) INJECTION 61% COMPARISON:  Radiographs of same day.  CT scan of September 22, 2012. FINDINGS: Cardiovascular: Atherosclerosis of thoracic aorta is noted without  aneurysm or dissection. Coronary artery calcifications are noted suggesting coronary artery disease. No pericardial effusion is noted. Mediastinum/Nodes: Large right hypodense thyroid mass is noted which is significantly increased in size compared to prior exam, concerning for malignancy. This results in significant displacement of the trachea to the left. 3.2 cm left hilar lymph node is noted. Moderate size hiatal hernia is noted. Lungs/Pleura: Mild bilateral pleural effusions are noted. Multiple pulmonary masses are noted bilaterally consistent with metastatic disease. The largest on the left measures 7.3 cm posteriorly in the left lower lobe. The largest on the right measures 8.4 cm anteriorly in the right middle lobe. No pneumothorax is noted. Upper Abdomen: No acute abnormality. Musculoskeletal: No chest wall abnormality. No acute or significant osseous findings. IMPRESSION: Large right hypodense thyroid mass is noted which is significantly increased in size compared to prior exam concerning for malignancy. This results in significant displacement of the trachea to the left. 3.2 cm left hilar lymph node is noted concerning for metastatic disease. Also noted are multiple pulmonary nodules in both lungs with the largest measuring approximately 8 cm in the right middle lobe. These are concerning for metastatic disease is well. Coronary artery calcifications are noted suggesting coronary artery disease. Mild bilateral pleural effusions are noted. Moderate size hiatal hernia is noted. Aortic Atherosclerosis (ICD10-I70.0). Electronically Signed   By: Marijo Conception, M.D.   On: 06/08/2017 22:01   Ct Cervical Spine Wo Contrast  Result Date: 06/08/2017 CLINICAL DATA:  Status post fall with impact to the back of the head. EXAM: CT HEAD WITHOUT CONTRAST CT CERVICAL SPINE WITHOUT CONTRAST TECHNIQUE: Multidetector CT imaging of the head and cervical spine was performed following the standard protocol without  intravenous contrast. Multiplanar CT image reconstructions of the cervical spine were also generated. COMPARISON:  06/07/2017 FINDINGS: CT HEAD FINDINGS Brain: No evidence of acute infarction, hemorrhage, hydrocephalus, extra-axial collection or mass lesion/mass effect. Advanced for age brain parenchymal atrophy and periventricular microangiopathy. Prior right basal ganglia and cerebellar lacunar infarcts. Vascular: Calcific atherosclerotic disease at the skullbase. Skull: Normal. Negative for fracture or focal lesion. Sinuses/Orbits: No acute finding. Other: None. CT CERVICAL SPINE FINDINGS Alignment: Normal. Skull base and vertebrae: No acute fracture. No primary bone lesion or focal pathologic process. Soft tissues and spinal canal: No prevertebral fluid or swelling. No visible canal hematoma. Disc levels:  Multilevel osteoarthritic changes. Upper chest: 1.3 cm soft tissue mass in the right upper lobe. Probable bilateral small pleural effusions. Other: Marked enlargement of the right thyroid gland measuring 5.4 by 5.4 by 8.1 cm. Right anterior chain cervical lymphadenopathy. IMPRESSION: No acute intracranial abnormality. Advanced for age brain parenchymal atrophy and chronic microvascular disease. Prior right basal ganglia and cerebellar lacunar infarcts. No evidence of acute traumatic injury to the cervical spine. Multilevel osteoarthritic changes of the cervical spine. 1.3 cm soft tissue mass in the right upper lobe, suspicious for pulmonary primary or metastatic malignancy. Marked enlargement of the right thyroid gland measuring up to 8.1 cm. Right anterior chain cervical lymphadenopathy. Electronically Signed   By: Fidela Salisbury M.D.   On: 06/08/2017 19:44   Mr Brain Wo Contrast  Result Date: 06/09/2017 CLINICAL DATA:  Concern for stroke.  Encephalopathy. EXAM: MRI HEAD WITHOUT CONTRAST MRA HEAD WITHOUT CONTRAST TECHNIQUE: Multiplanar, multiecho pulse sequences of the brain and surrounding  structures were obtained without intravenous contrast. Angiographic images of the head were obtained using MRA technique without contrast. COMPARISON:  Head CT 06/08/2017 Brain MRI 02/09/2012 and 10/27/2010 Carotid ultrasound 10/27/2010  CT neck 12/05/2012 FINDINGS: The study is severely degraded by motion, despite efforts to reduce this artifact, including utilization of motion-resistant MR sequences. The findings of the study are interpreted in the context of reduced sensitivity/specificity. MRI HEAD FINDINGS Brain: The midline structures are normal. No focal diffusion restriction to indicate acute infarct. No intraparenchymal hemorrhage. There is severe atrophy. There are multiple old cerebellar and supratentorial infarcts. No midline shift or mass lesion. There is Wallerian degeneration of the right cerebral peduncle. No chronic microhemorrhage or cerebral amyloid angiopathy. No hydrocephalus, age advanced atrophy or lobar predominant volume loss. No dural abnormality or extra-axial collection. Skull and upper cervical spine: The visualized skull base, calvarium, upper cervical spine and extracranial soft tissues are normal. Sinuses/Orbits: No fluid levels or advanced mucosal thickening. No mastoid effusion. Normal orbits. MRA HEAD FINDINGS Intracranial internal carotid arteries: There is no flow related enhancement seen within the right internal carotid artery. On prior brain MRI studies, the right ICA flow void was abnormal and is suspected to be chronic, particularly given the severe volume loss of the right MCA territory. The left ICA is patent. Anterior cerebral arteries: Normal bilateral a 2 segments. Minimal enhancement of right A1 segment. Middle cerebral arteries: Normal left MCA. Minimal to no flow related enhancement of the right MCA. Posterior communicating arteries: Present bilaterally. Posterior cerebral arteries: Limited visualization due to motion, but patent proximally. Poor visualization of the  posterior circulation below the level of the posterior cerebral arteries. IMPRESSION: 1. Severely motion degraded examination. Within that limitation, there is no acute ischemia seen on diffusion-weighted imaging. 2. Severely motion degraded intracranial MRA. Occlusion of the right internal carotid artery with little to no flow related enhancement seen in the right middle cerebral artery. This is a chronic occlusion, as demonstrated on prior MRI and CT studies. 3. Minimal appreciable flow related enhancement within the basilar artery, with visualization severely limited by a motion. The basilar artery with severely diminutive on the CT neck performed 12/05/2012. With this degree of motion, it is not possible to determine whether the basilar artery is patent. 4. 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 Electronically Signed   By: Ulyses Jarred M.D.   On: 06/09/2017 06:58   US Carotid Bilateral (at [facility] Only)  Result Date: 06/09/2017 CLINICAL DATA:  Altered mental status. EXAM: BILATERAL CAROTID DUPLEX ULTRASOUND TECHNIQUE: [place] scale imaging, color Doppler and duplex ultrasound were performed of bilateral carotid and vertebral arteries in the neck. COMPARISON:  MRI 06/09/2017. Head CT 06/08/2017. Neck CT 11/25/2012 . Thyroid ultrasound 04/14/2013 FINDINGS: Criteria: Quantification of carotid stenosis is based on velocity parameters that correlate the residual internal carotid diameter with NASCET-based stenosis levels, using the diameter of the distal internal carotid lumen as the denominator for stenosis measurement. The following velocity measurements were obtained: RIGHT ICA:  Complete occlusion. CCA:  120/14 cm/sec ECA:  136 cm/sec LEFT ICA:  173/35 cm/sec CCA:  259/56 cm/sec SYSTOLIC ICA/CCA RATIO:  1.5 DIASTOLIC ICA/CCA RATIO:  2.4 ECA:  98 cm/sec RIGHT CAROTID ARTERY: Diffuse mild right common carotid and carotid bifurcation disease noted. Complete occlusion of the  right internal carotid artery appears to be present. RIGHT VERTEBRAL ARTERY:  Patent with antegrade flow. LEFT CAROTID ARTERY: Moderate left common carotid and carotid bifurcation atherosclerotic vascular plaque. LEFT VERTEBRAL ARTERY:  Patent with antegrade flow. Complex cystic mass incidentally noted in the right thyroid gland. Patient had a thyroid ultrasound on 04/14/2013 was abnormal. Repeat thyroid ultrasound should be considered for  continued evaluation. IMPRESSION: 1.  Complete occlusion of the right ICA. 2. Moderate left carotid bifurcation atherosclerotic vascular disease with degree of stenosis 50- 69%. Given the probable complete occlusion of the right ICA and severity of the disease at the left carotid bifurcation, patient may benefit from carotid CTA. 3. Vertebrals are patent with antegrade flow. 4. Complex cystic mass right thyroid gland. Patient had a abnormal thyroid ultrasound 04/14/2013. Repeat thyroid ultrasound should be considered for continued evaluation. Electronically Signed   By: Marcello Moores  Register   On: 06/09/2017 11:04   Mr Jodene Nam Head Wo Contrast  Result Date: 06/09/2017 CLINICAL DATA:  Concern for stroke.  Encephalopathy. EXAM: MRI HEAD WITHOUT CONTRAST MRA HEAD WITHOUT CONTRAST TECHNIQUE: Multiplanar, multiecho pulse sequences of the brain and surrounding structures were obtained without intravenous contrast. Angiographic images of the head were obtained using MRA technique without contrast. COMPARISON:  Head CT 06/08/2017 Brain MRI 02/09/2012 and 10/27/2010 Carotid ultrasound 10/27/2010 CT neck 12/05/2012 FINDINGS: The study is severely degraded by motion, despite efforts to reduce this artifact, including utilization of motion-resistant MR sequences. The findings of the study are interpreted in the context of reduced sensitivity/specificity. MRI HEAD FINDINGS Brain: The midline structures are normal. No focal diffusion restriction to indicate acute infarct. No intraparenchymal  hemorrhage. There is severe atrophy. There are multiple old cerebellar and supratentorial infarcts. No midline shift or mass lesion. There is Wallerian degeneration of the right cerebral peduncle. No chronic microhemorrhage or cerebral amyloid angiopathy. No hydrocephalus, age advanced atrophy or lobar predominant volume loss. No dural abnormality or extra-axial collection. Skull and upper cervical spine: The visualized skull base, calvarium, upper cervical spine and extracranial soft tissues are normal. Sinuses/Orbits: No fluid levels or advanced mucosal thickening. No mastoid effusion. Normal orbits. MRA HEAD FINDINGS Intracranial internal carotid arteries: There is no flow related enhancement seen within the right internal carotid artery. On prior brain MRI studies, the right ICA flow void was abnormal and is suspected to be chronic, particularly given the severe volume loss of the right MCA territory. The left ICA is patent. Anterior cerebral arteries: Normal bilateral a 2 segments. Minimal enhancement of right A1 segment. Middle cerebral arteries: Normal left MCA. Minimal to no flow related enhancement of the right MCA. Posterior communicating arteries: Present bilaterally. Posterior cerebral arteries: Limited visualization due to motion, but patent proximally. Poor visualization of the posterior circulation below the level of the posterior cerebral arteries. IMPRESSION: 1. Severely motion degraded examination. Within that limitation, there is no acute ischemia seen on diffusion-weighted imaging. 2. Severely motion degraded intracranial MRA. Occlusion of the right internal carotid artery with little to no flow related enhancement seen in the right middle cerebral artery. This is a chronic occlusion, as demonstrated on prior MRI and CT studies. 3. Minimal appreciable flow related enhancement within the basilar artery, with visualization severely limited by a motion. The basilar artery with severely diminutive on  the CT neck performed 12/05/2012. With this degree of motion, it is not possible to determine whether the basilar artery is patent. 4. 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 Electronically Signed   By: Ulyses Jarred M.D.   On: 06/09/2017 06:58    ASSESSMENT & PLAN:   # 76 year old male patient with multiple medical problems- including multiple strokes and dementia- chronic disease- currently admitted to the hospital for fall- noted to have right-sided thyroid mass multiple lung nodules.  # Right-sided neck mass with multiple lung nodules- highly suspicious for thyroid cancer metastasizing to the  lung. Long discussion the patient's daughter given the overall poor prognosis given the possibility of metastatic thyroid malignancy. Discussed that treatment options like surgery is prohibitive given his medical condition. Also discussed regarding the use of radioactive iodine for palliation. However we'll need a tissue diagnosis for further management. Patient's daughter feels that patient's quality of life is significantly declined over the last year or so. She feels that further workup/the context of likely incurable malignancy- especially the context of multiple medical problems would be detriment patient's quality of life. Discussed that her cancer is slow growing malignancy- his life expectancy is in the order of few months. Hospice would be very reasonable. She is interested in palliation/hospice. Agree with palliative care evaluation.  # Leukocytosis/infiltrative changes noted on the CT scan- question pneumonia. Agree with antibiotics.  # Chronic old strokes/multiple falls/ poor performance status.  Thank you Dr.Mody for allowing me to participate in the care of your pleasant patient. Please do not hesitate to contact me with questions or concerns in the interim.  All questions were answered. The patient's knows to call the clinic with any problems, questions or  concerns.  Patient's daughter Eliezer Lofts # [phone]/ [dpoa].     Cammie Sickle, MD 06/09/2017 9:48 PM
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
## 2017-06-09 NOTE — Progress Notes (Signed)
*  PRELIMINARY RESULTS* Echocardiogram 2D Echocardiogram has been performed.  Sherrie Sport 06/09/2017, 2:33 PM

## 2017-06-09 NOTE — Progress Notes (Signed)
Family Meeting Note  Advance Directive:no  Today a meeting took place with the patient's son.  Patient is unable to participate due TC:CEQFDV capacity previous CVA   The following clinical team members were present during this meeting:MD  The following were discussed:Patient's diagnosis: sepsis PNA Thyroid mass with metastic disease likely , Patient's progosis: Unable to determine and Goals for treatment: Full Code  Additional follow-up to be provided: Oncology evaluation and palliative care  Time spent during discussion:20 minutes  Teana Lindahl, MD

## 2017-06-09 NOTE — Evaluation (Signed)
Physical Therapy Evaluation Patient Details Name: Ian Townsend MRN: [id number] DOB: [birth year] Today's Date: 06/09/2017   History of Present Illness  Ian Townsend is a 76 y.o. male with a known history of BPH, Afib, CKD3, GERD, COPD, GERD, HLD, HTN,  PVD, CVA, seizures presents to the emergency department for evaluation of back pain s/p fall.  This is his second fall in two days.  Yesterday he sustained a mechanical fall and hit the back of his head, no LOC. He was seen in the ED and discharged home. Today he had a similar fall except that he landed on his bottom after his walker got out from under him, no head trauma, no LOC. Patient's only complaints are headache and slight cough.  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 Lengthy conversation with the patient's son reveals concern for his psychomotor agitation, weakness and gait instability, symptoms that are similar to when he had a previous stroke. Patient's son states that ENT Dr. Jenne Campus has been following the thyroid mass, but risk of surgery outweighs the benefit.  Family was unaware of metastases. Pt is now admitted for sepsis secondary to CAP, R thyroid mass, and recent falls.   Clinical Impression  Pt admitted with above diagnosis. Pt currently with functional limitations due to the deficits listed below (see PT Problem List). He demonstrates very poor safety awareness with mobility. Pt requires minA+1 for bed mobility, transfers, and ambulation. He ambualtes with rolling walker while dragging LLE. Poor LLE dorsiflexion and advancement of leg but he is able to improve stepping with cues from therapist. Pt requires repeated cuing to stay within confines of walker and for proper sequencing. He is mildly  unsteady requiring intermittent minA+1 to stabilize. L side neglect with poor safety awareness during ambulation. With MMT no focal weakness identified in LUE/LLE. Pt will need SNF placement at discharge in order to facilitate safe transition back to ALF. He is currently too unsafe to return to ALF. Pt will benefit from PT services to address deficits in strength, balance, and mobility in order to return to full function at home.     Follow Up Recommendations SNF    Equipment Recommendations  None recommended by PT    Recommendations for Other Services       Precautions / Restrictions Precautions Precautions: Fall Restrictions Weight Bearing Restrictions: No      Mobility  Bed Mobility Overal bed mobility: Needs Assistance Bed Mobility: Supine to Sit     Supine to sit: Min assist     General bed mobility comments: Pt requiring minA+1 to assist LE as well as scoot toward EOB once in sitting. HOB elevated and bed rails utilized. Pt moves very slowly  Transfers Overall transfer level: Needs assistance Equipment used: Rolling walker (2 wheeled) Transfers: Sit to/from Stand Sit to Stand: Min assist         General transfer comment: Pt is unsteady with transfers requiring minA+1 to stabilize as well as increased time. He does however demonstrate safe hand placement without need for cuing  Ambulation/Gait Ambulation/Gait assistance: Min assist Ambulation Distance (Feet): 40 Feet Assistive device: Rolling walker (2 wheeled) Gait Pattern/deviations: Decreased dorsiflexion - left;Decreased stride length Gait velocity: Decreased Gait velocity interpretation: <1.8 ft/sec, indicative of risk for recurrent falls General Gait Details: Pt ambualtes with  rolling walker while dragging LLE. Poor LLE dorsiflexion and advancement but is able to improve some with cues. Pt requires repeated cuing to stay within confines of walker and for proper sequencing. He is mildly unsteady requiring  intermittent minA+1 to stabilize. L side neglect with poor safety awareness  Stairs            Wheelchair Mobility    Modified Rankin (Stroke Patients Only)       Balance Overall balance assessment: Needs assistance Sitting-balance support: No upper extremity supported Sitting balance-Leahy Scale: Good     Standing balance support: No upper extremity supported Standing balance-Leahy Scale: Poor Standing balance comment: Requires minA+1 from therapist and UE support on rolling walker                             Pertinent Vitals/Pain Pain Assessment: No/denies pain    Home Living Family/patient expects to be discharged to:: Assisted living [facility])               Home Equipment: Dan Humphreys - 4 wheels;Shower seat - built in      Prior Function Level of Independence: Needs assistance   Gait / Transfers Assistance Needed: Modified independent with rollator. Pt fell on Monday and Tuesday. Intermittent falls/sliding off bed/chairs over the last year. Chronic dragging of L leg/foot secondary to CVA with L side neglect  ADL's / Homemaking Assistance Needed: Recently needing increased assist with bathing/dressing. Facility provides meals.         Hand Dominance   Dominant Hand: Right    Extremity/Trunk Assessment   Upper Extremity Assessment Upper Extremity Assessment: Overall WFL for tasks assessed    Lower Extremity Assessment Lower Extremity Assessment: LLE deficits/detail LLE Deficits / Details: No focal weakness noted with MMT, functional weakness with ambulation in LLE. Unclear if this is due to weakness or simply L side neglect. 5/5 ankle dorsiflexion and knee flex/ext when tested in sitting       Communication   Communication: No difficulties  Cognition Arousal/Alertness: Awake/alert Behavior During Therapy: WFL for tasks assessed/performed Overall Cognitive Status: Within Functional Limits for tasks assessed                                  General Comments: Pt is AOx3 at time of PT evaluation. L side neglect noted      General Comments      Exercises     Assessment/Plan    PT Assessment Patient needs continued PT services  PT Problem List Decreased strength;Decreased activity tolerance;Decreased balance;Decreased mobility;Decreased knowledge of use of DME;Decreased safety awareness       PT Treatment Interventions DME instruction;Gait training;Therapeutic activities;Therapeutic exercise;Balance training;Patient/family education    PT Goals (Current goals can be found in the Care Plan section)  Acute Rehab PT Goals Patient Stated Goal: Return to prior function PT Goal Formulation: With family Time For Goal Achievement: 06/23/17 Potential to Achieve Goals: Fair    Frequency Min 2X/week   Barriers to discharge Decreased caregiver support Per family ALF likely cannot provide 24/7 assist for all mobility    Co-evaluation               AM-PAC PT "6 Clicks" Daily Activity  Outcome Measure Difficulty turning over in bed (including adjusting bedclothes, sheets and blankets)?: Unable Difficulty moving from lying on back to sitting on the side of  the bed? : Unable Difficulty sitting down on and standing up from a chair with arms (e.g., wheelchair, bedside commode, etc,.)?: Unable Help needed moving to and from a bed to chair (including a wheelchair)?: A Little Help needed walking in hospital room?: A Little Help needed climbing 3-5 steps with a railing? : A Lot 6 Click Score: 11    End of Session Equipment Utilized During Treatment: Gait belt Activity Tolerance: Patient tolerated treatment well Patient left: in bed;with call bell/phone within reach;with bed alarm set Nurse Communication: Mobility status;Other (comment) (Care manager notified of DC recommendations) PT Visit Diagnosis: Unsteadiness on feet (R26.81);Repeated falls (R29.6);Muscle weakness (generalized)  (M62.81);Difficulty in walking, not elsewhere classified (R26.2)    Time: 4696-2952 PT Time Calculation (min) (ACUTE ONLY): 27 min   Charges:   PT Evaluation $PT Eval Moderate Complexity: 1 Mod PT Treatments $Gait Training: 8-22 mins   PT G Codes:   PT G-Codes **NOT FOR INPATIENT CLASS** Functional Assessment Tool Used: AM-PAC 6 Clicks Basic Mobility Functional Limitation: Mobility: Walking and moving around Mobility: Walking and Moving Around Current Status ([id number]): At least 60 percent but less than 80 percent impaired, limited or restricted Mobility: Walking and Moving Around Goal Status [phone]): At least 40 percent but less than 60 percent impaired, limited or restricted    Lynnea Maizes PT, DPT    Adithya Difrancesco 06/09/2017, 4:26 PM

## 2017-06-10 DIAGNOSIS — E079 Disorder of thyroid, unspecified: Secondary | ICD-10-CM

## 2017-06-10 DIAGNOSIS — Z66 Do not resuscitate: Secondary | ICD-10-CM

## 2017-06-10 DIAGNOSIS — Z515 Encounter for palliative care: Secondary | ICD-10-CM

## 2017-06-10 DIAGNOSIS — W19XXXA Unspecified fall, initial encounter: Secondary | ICD-10-CM

## 2017-06-10 DIAGNOSIS — J189 Pneumonia, unspecified organism: Secondary | ICD-10-CM

## 2017-06-10 DIAGNOSIS — R918 Other nonspecific abnormal finding of lung field: Secondary | ICD-10-CM

## 2017-06-10 DIAGNOSIS — Z7189 Other specified counseling: Secondary | ICD-10-CM

## 2017-06-10 LAB — GLUCOSE, CAPILLARY: Glucose-Capillary: 130 mg/dL — ABNORMAL HIGH (ref 65–99)

## 2017-06-10 LAB — BASIC METABOLIC PANEL
Anion gap: 8 (ref 5–15)
BUN: 16 mg/dL (ref 6–20)
CALCIUM: 10.4 mg/dL — AB (ref 8.9–10.3)
CHLORIDE: 104 mmol/L (ref 101–111)
CO2: 25 mmol/L (ref 22–32)
CREATININE: 1.34 mg/dL — AB (ref 0.61–1.24)
GFR calc non Af Amer: 50 mL/min — ABNORMAL LOW (ref >60)
GFR, EST AFRICAN AMERICAN: 58 mL/min — AB (ref >60)
Glucose, Bld: 172 mg/dL — ABNORMAL HIGH (ref 65–99)
Potassium: 3.9 mmol/L (ref 3.5–5.1)
Sodium: 137 mmol/L (ref 135–145)

## 2017-06-10 LAB — CBC
HCT: 37.6 % — ABNORMAL LOW (ref 40.0–52.0)
Hemoglobin: 13 g/dL (ref 13.0–18.0)
MCH: 30.5 pg (ref 26.0–34.0)
MCHC: 34.6 g/dL (ref 32.0–36.0)
MCV: 88.2 fL (ref 80.0–100.0)
PLATELETS: 292 10*3/uL (ref 150–440)
RBC: 4.26 MIL/uL — AB (ref 4.40–5.90)
RDW: 12.9 % (ref 11.5–14.5)
WBC: 32.7 10*3/uL — ABNORMAL HIGH (ref 3.8–10.6)

## 2017-06-10 MED ORDER — GUAIFENESIN-DM 100-10 MG/5ML PO SYRP
5.0000 mL | ORAL_SOLUTION | ORAL | Status: DC | PRN
  Administered 2017-06-10 – 2017-06-12 (×5): 5 mL via ORAL
  Filled 2017-06-10 (×6): qty 5

## 2017-06-10 MED ORDER — LABETALOL HCL 5 MG/ML IV SOLN
10.0000 mg | INTRAVENOUS | Status: DC | PRN
  Administered 2017-06-10: 21:00:00 10 mg via INTRAVENOUS
  Filled 2017-06-10: qty 4

## 2017-06-10 MED ORDER — POLYETHYLENE GLYCOL 3350 17 G PO PACK: 17.0000 g | PACK | Freq: Every day | ORAL | Status: DC | PRN

## 2017-06-10 MED ORDER — SENNOSIDES-DOCUSATE SODIUM 8.6-50 MG PO TABS
1.0000 | ORAL_TABLET | Freq: Two times a day (BID) | ORAL | Status: DC
  Administered 2017-06-10 – 2017-06-13 (×8): 1 via ORAL
  Filled 2017-06-10 (×9): qty 1

## 2017-06-10 MED ORDER — PIPERACILLIN-TAZOBACTAM 3.375 G IVPB
3.3750 g | Freq: Three times a day (TID) | INTRAVENOUS | Status: DC
  Administered 2017-06-10 – 2017-06-13 (×11): 3.375 g via INTRAVENOUS
  Filled 2017-06-10 (×14): qty 50

## 2017-06-10 NOTE — Evaluation (Signed)
Occupational Therapy Evaluation Patient Details Name: Ian Townsend MRN: [id number] DOB: 07/28/1941 Today's Date: 06/10/2017    History of Present Illness Ian Townsend is a 76 y.o. male with a known history of BPH, Afib, CKD3, GERD, COPD, GERD, HLD, HTN,  PVD, CVA, seizures presents to the emergency department for evaluation of back pain s/p fall.  This is his second fall in two days.  Yesterday he sustained a mechanical fall and hit the back of his head, no LOC. He was seen in the ED and discharged home. Today he had a similar fall except that he landed on his bottom after his walker got out from under him, no head trauma, no LOC. Patient's only complaints are headache and slight cough.  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 Lengthy conversation with the patient's son reveals concern for his psychomotor agitation, weakness and gait instability, symptoms that are similar to when he had a previous stroke. Patient's son states that ENT Dr. Tami Ribas has been following the thyroid mass, but risk of surgery outweighs the benefit.  Family was unaware of metastases. Pt is now admitted for sepsis secondary to CAP, R thyroid mass, and recent falls.    Clinical Impression   Pt seen for OT evaluation this date. Pt was mod indep with 4WRW living in ALF prior to admission, now presenting with mild confusion, decreased activity tolerance, decreased awareness of deficits, with need for skilled OT services to address noted impairments and functional deficits in mobility and self care tasks in order to maximize return to PLOF and minimize future falls/injury/rehospitalization. Pt lethargic/fatigued this date, requiring mod assist for bed mobility. Son and daughter present for  evaluation. Pt unsafe to return to ALF currently, recommend transition to STR following this hospitalization.    Follow Up Recommendations  SNF    Equipment Recommendations  None recommended by OT    Recommendations for Other Services       Precautions / Restrictions Precautions Precautions: Fall Restrictions Weight Bearing Restrictions: No      Mobility Bed Mobility Overal bed mobility: Needs Assistance             General bed mobility comments: required mod assist for scooting up in bed with verbal cues to incorporate L side into task due to L side neglect  Transfers                 General transfer comment: unsafe to attempt this date, continue to assess    Balance                                           ADL either performed or assessed with clinical judgement   ADL Overall ADL's : Needs assistance/impaired Eating/Feeding: Bed level;Set up   Grooming: Bed level;Set up   Upper Body Bathing: Min guard;Sitting;Set up   Lower Body Bathing: Bed level;Minimal assistance;Moderate assistance   Upper Body Dressing : Set up;Min guard;Sitting   Lower Body Dressing: Minimal assistance;Bed level;Moderate assistance     Toilet Transfer Details (indicate cue type and reason): deferred due to safety/pt fatigue           General ADL Comments: pt generally at min assist  level from bed level positioning, limited by fatigue, mild confusion, L side neglect     Vision Baseline Vision/History: Wears glasses Wears Glasses: Reading only Patient Visual Report: No change from baseline Vision Assessment?: No apparent visual deficits     Perception     Praxis      Pertinent Vitals/Pain Pain Assessment: No/denies pain     Hand Dominance Right   Extremity/Trunk Assessment Upper Extremity Assessment Upper Extremity Assessment: Overall WFL for tasks assessed (verbal cues to incorporate LUE into task due to neglect)   Lower Extremity  Assessment Lower Extremity Assessment: LLE deficits/detail;Defer to PT evaluation       Communication Communication Communication: No difficulties   Cognition Arousal/Alertness: Lethargic Behavior During Therapy: WFL for tasks assessed/performed Overall Cognitive Status: Impaired/Different from baseline Area of Impairment: Orientation;Safety/judgement                 Orientation Level: Disoriented to;Time;Situation       Safety/Judgement: Decreased awareness of deficits     General Comments: L side neglect noted (at baseline); very lethargic/fatigued   General Comments       Exercises Other Exercises Other Exercises: pt/family educated briefly in compensatory strategies to support L side neglect   Shoulder Instructions      Home Living Family/patient expects to be discharged to:: Skilled nursing facility (from [facility])                             Home Equipment: Gilford Rile - 4 wheels;Shower seat - built in          Prior Functioning/Environment Level of Independence: Needs assistance  Gait / Transfers Assistance Needed: Modified independent with rollator. Pt fell on Monday and Tuesday. Intermittent falls/sliding off bed/chairs over the last year. Chronic dragging of L leg/foot secondary to CVA with L side neglect ADL's / Homemaking Assistance Needed: Recently needing increased assist with bathing/dressing. Facility provides meals.             OT Problem List: Decreased cognition;Decreased coordination;Decreased activity tolerance;Decreased safety awareness;Impaired UE functional use;Decreased knowledge of use of DME or AE;Impaired balance (sitting and/or standing)      OT Treatment/Interventions: Self-care/ADL training;Therapeutic activities;Therapeutic exercise;Energy conservation;Patient/family education    OT Goals(Current goals can be found in the care plan section) Acute Rehab OT Goals Patient Stated Goal: Return to prior  function OT Goal Formulation: With patient/family Time For Goal Achievement: 06/24/17 Potential to Achieve Goals: Good  OT Frequency: Min 1X/week   Barriers to D/C: Decreased caregiver support          Co-evaluation              AM-PAC PT "6 Clicks" Daily Activity     Outcome Measure Help from another person eating meals?: None Help from another person taking care of personal grooming?: A Little Help from another person toileting, which includes using toliet, bedpan, or urinal?: A Lot Help from another person bathing (including washing, rinsing, drying)?: A Lot Help from another person to put on and taking off regular upper body clothing?: A Little Help from another person to put on and taking off regular lower body clothing?: A Lot 6 Click Score: 16   End of Session    Activity Tolerance: Patient limited by lethargy;Patient limited by fatigue Patient left: in bed;with call bell/phone within reach;with bed alarm set;with family/visitor present  OT Visit Diagnosis: Other abnormalities of gait and mobility (R26.89);Other symptoms and signs involving  cognitive function                Time: 1432-1501 OT Time Calculation (min): 29 min Charges:  OT General Charges $OT Visit: 1 Procedure OT Evaluation $OT Eval Moderate Complexity: 1 Procedure OT Treatments $Therapeutic Activity: 8-22 mins G-Codes: OT G-codes **NOT FOR INPATIENT CLASS** Functional Assessment Tool Used: AM-PAC 6 Clicks Daily Activity;Clinical judgement Functional Limitation: Self care Self Care Current Status (Z3582): At least 40 percent but less than 60 percent impaired, limited or restricted Self Care Goal Status (P1898): At least 20 percent but less than 40 percent impaired, limited or restricted   Jeni Salles, MPH, MS, OTR/L ascom [phone] 06/10/17, 3:53 PM

## 2017-06-10 NOTE — Progress Notes (Signed)
Ian Townsend   DOB:[birth year]   SW#:[id number]    Subjective: Thyroid mass/multiple lung nodules. Patient resting comfortably. He is sleeping; difficult to arouse. No family around.  ROS: Unable to assess  Objective:  Vitals:   06/10/17 2045 06/10/17 2110  BP: (!) 167/87 (!) 147/63  Pulse: 89 72  Resp:    Temp:    SpO2: 94%      Intake/Output Summary (Last 24 hours) at 06/10/17 2130 Last data filed at 06/10/17 1851  Gross per 24 hour  Intake              200 ml  Output              550 ml  Net             -350 ml    GENERAL: Frail appearing cachectic male patient sleepy.  no distress and comfortable. He is alone.  EYES: no pallor or icterus OROPHARYNX: no thrush or ulceration.Poor dentition.  NECK: 5-6 cm neck mass noted on the right side.  LYMPH:  no palpable axillary or inguinal regions LUNGS: decreased breath sounds to auscultation at bases and  No wheeze or crackles HEART/CVS: regular rate & rhythm and no murmurs; No lower extremity edema ABDOMEN: abdomen soft, non-tender and normal bowel sounds Musculoskeletal:no cyanosis of digits and no clubbing  PSYCH: alert & oriented x 0-1.  NEURO: no focal motor/sensory deficits SKIN:  no rashes or significant lesions   Labs:  Lab Results  Component Value Date   WBC 32.7 (H) 06/10/2017   HGB 13.0 06/10/2017   HCT 37.6 (L) 06/10/2017   MCV 88.2 06/10/2017   PLT 292 06/10/2017   NEUTROABS 26.4 (H) 06/08/2017    Lab Results  Component Value Date   NA 137 06/10/2017   K 3.9 06/10/2017   CL 104 06/10/2017   CO2 25 06/10/2017    Studies:  Dg Lumbar Spine 2-3 Views  Result Date: 06/09/2017 CLINICAL DATA:  Back pain, recent falls. EXAM: LUMBAR SPINE - 2-3 VIEW COMPARISON:  CT abdomen and pelvis dated July 13, 2013. Pelvic x-rays dated August 05, 2012. FINDINGS: There are 5 lumbar type vertebral bodies. There is no definite lumbar spinal fracture. Vertebral body heights are maintained. Alignment is normal. Slight  superior endplate concavity of the L4 vertebral body is unchanged. Mild multilevel degenerative changes from L3-L4 through L5-S1. Diffuse osteopenia. Atherosclerotic calcifications of the abdominal aorta. Contrast within the bladder. IMPRESSION: Mild lower lumbar spine degenerative changes. Electronically Signed   By: Titus Dubin M.D.   On: 06/09/2017 12:49   Ct Chest W Contrast  Result Date: 06/08/2017 CLINICAL DATA:  Cough, unwitnessed fall. EXAM: CT CHEST WITH CONTRAST TECHNIQUE: Multidetector CT imaging of the chest was performed during intravenous contrast administration. CONTRAST:  45mL ISOVUE-300 IOPAMIDOL (ISOVUE-300) INJECTION 61% COMPARISON:  Radiographs of same day.  CT scan of September 22, 2012. FINDINGS: Cardiovascular: Atherosclerosis of thoracic aorta is noted without aneurysm or dissection. Coronary artery calcifications are noted suggesting coronary artery disease. No pericardial effusion is noted. Mediastinum/Nodes: Large right hypodense thyroid mass is noted which is significantly increased in size compared to prior exam, concerning for malignancy. This results in significant displacement of the trachea to the left. 3.2 cm left hilar lymph node is noted. Moderate size hiatal hernia is noted. Lungs/Pleura: Mild bilateral pleural effusions are noted. Multiple pulmonary masses are noted bilaterally consistent with metastatic disease. The largest on the left measures 7.3 cm posteriorly in the left lower lobe. The  largest on the right measures 8.4 cm anteriorly in the right middle lobe. No pneumothorax is noted. Upper Abdomen: No acute abnormality. Musculoskeletal: No chest wall abnormality. No acute or significant osseous findings. IMPRESSION: Large right hypodense thyroid mass is noted which is significantly increased in size compared to prior exam concerning for malignancy. This results in significant displacement of the trachea to the left. 3.2 cm left hilar lymph node is noted concerning  for metastatic disease. Also noted are multiple pulmonary nodules in both lungs with the largest measuring approximately 8 cm in the right middle lobe. These are concerning for metastatic disease is well. Coronary artery calcifications are noted suggesting coronary artery disease. Mild bilateral pleural effusions are noted. Moderate size hiatal hernia is noted. Aortic Atherosclerosis (ICD10-I70.0). Electronically Signed   By: Marijo Conception, M.D.   On: 06/08/2017 22:01   Mr Brain Wo Contrast  Result Date: 06/09/2017 CLINICAL DATA:  Concern for stroke.  Encephalopathy. EXAM: MRI HEAD WITHOUT CONTRAST MRA HEAD WITHOUT CONTRAST TECHNIQUE: Multiplanar, multiecho pulse sequences of the brain and surrounding structures were obtained without intravenous contrast. Angiographic images of the head were obtained using MRA technique without contrast. COMPARISON:  Head CT 06/08/2017 Brain MRI 02/09/2012 and 10/27/2010 Carotid ultrasound 10/27/2010 CT neck 12/05/2012 FINDINGS: The study is severely degraded by motion, despite efforts to reduce this artifact, including utilization of motion-resistant MR sequences. The findings of the study are interpreted in the context of reduced sensitivity/specificity. MRI HEAD FINDINGS Brain: The midline structures are normal. No focal diffusion restriction to indicate acute infarct. No intraparenchymal hemorrhage. There is severe atrophy. There are multiple old cerebellar and supratentorial infarcts. No midline shift or mass lesion. There is Wallerian degeneration of the right cerebral peduncle. No chronic microhemorrhage or cerebral amyloid angiopathy. No hydrocephalus, age advanced atrophy or lobar predominant volume loss. No dural abnormality or extra-axial collection. Skull and upper cervical spine: The visualized skull base, calvarium, upper cervical spine and extracranial soft tissues are normal. Sinuses/Orbits: No fluid levels or advanced mucosal thickening. No mastoid effusion.  Normal orbits. MRA HEAD FINDINGS Intracranial internal carotid arteries: There is no flow related enhancement seen within the right internal carotid artery. On prior brain MRI studies, the right ICA flow void was abnormal and is suspected to be chronic, particularly given the severe volume loss of the right MCA territory. The left ICA is patent. Anterior cerebral arteries: Normal bilateral a 2 segments. Minimal enhancement of right A1 segment. Middle cerebral arteries: Normal left MCA. Minimal to no flow related enhancement of the right MCA. Posterior communicating arteries: Present bilaterally. Posterior cerebral arteries: Limited visualization due to motion, but patent proximally. Poor visualization of the posterior circulation below the level of the posterior cerebral arteries. IMPRESSION: 1. Severely motion degraded examination. Within that limitation, there is no acute ischemia seen on diffusion-weighted imaging. 2. Severely motion degraded intracranial MRA. Occlusion of the right internal carotid artery with little to no flow related enhancement seen in the right middle cerebral artery. This is a chronic occlusion, as demonstrated on prior MRI and CT studies. 3. Minimal appreciable flow related enhancement within the basilar artery, with visualization severely limited by a motion. The basilar artery with severely diminutive on the CT neck performed 12/05/2012. With this degree of motion, it is not possible to determine whether the basilar artery is patent. 4. 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 Electronically Signed   By: Ulyses Jarred M.D.   On: 06/09/2017 06:58   US Carotid Bilateral (  at [facility] Only)  Result Date: 06/09/2017 CLINICAL DATA:  Altered mental status. EXAM: BILATERAL CAROTID DUPLEX ULTRASOUND TECHNIQUE: Seever scale imaging, color Doppler and duplex ultrasound were performed of bilateral carotid and vertebral arteries in the neck. COMPARISON:  MRI  06/09/2017. Head CT 06/08/2017. Neck CT 11/25/2012 . Thyroid ultrasound 04/14/2013 FINDINGS: Criteria: Quantification of carotid stenosis is based on velocity parameters that correlate the residual internal carotid diameter with NASCET-based stenosis levels, using the diameter of the distal internal carotid lumen as the denominator for stenosis measurement. The following velocity measurements were obtained: RIGHT ICA:  Complete occlusion. CCA:  120/14 cm/sec ECA:  136 cm/sec LEFT ICA:  173/35 cm/sec CCA:  951/88 cm/sec SYSTOLIC ICA/CCA RATIO:  1.5 DIASTOLIC ICA/CCA RATIO:  2.4 ECA:  98 cm/sec RIGHT CAROTID ARTERY: Diffuse mild right common carotid and carotid bifurcation disease noted. Complete occlusion of the right internal carotid artery appears to be present. RIGHT VERTEBRAL ARTERY:  Patent with antegrade flow. LEFT CAROTID ARTERY: Moderate left common carotid and carotid bifurcation atherosclerotic vascular plaque. LEFT VERTEBRAL ARTERY:  Patent with antegrade flow. Complex cystic mass incidentally noted in the right thyroid gland. Patient had a thyroid ultrasound on 04/14/2013 was abnormal. Repeat thyroid ultrasound should be considered for continued evaluation. IMPRESSION: 1.  Complete occlusion of the right ICA. 2. Moderate left carotid bifurcation atherosclerotic vascular disease with degree of stenosis 50- 69%. Given the probable complete occlusion of the right ICA and severity of the disease at the left carotid bifurcation, patient may benefit from carotid CTA. 3. Vertebrals are patent with antegrade flow. 4. Complex cystic mass right thyroid gland. Patient had a abnormal thyroid ultrasound 04/14/2013. Repeat thyroid ultrasound should be considered for continued evaluation. Electronically Signed   By: Marcello Moores  Register   On: 06/09/2017 11:04   Dg Chest Port 1 View  Result Date: 06/10/2017 CLINICAL DATA:  Shortness of breath EXAM: PORTABLE CHEST 1 VIEW COMPARISON:  Chest CT 06/08/2017 FINDINGS: There  are scattered nodular opacities that are consistent with the multiple pulmonary masses identified on the recent chest CT. There are small bilateral pleural effusions. There is multifocal consolidation, unchanged. IMPRESSION: Unchanged appearance of the chest with multiple pulmonary masses and multifocal consolidation. Electronically Signed   By: Ulyses Jarred M.D.   On: 06/10/2017 00:23   Mr Jodene Nam Head Wo Contrast  Result Date: 06/09/2017 CLINICAL DATA:  Concern for stroke.  Encephalopathy. EXAM: MRI HEAD WITHOUT CONTRAST MRA HEAD WITHOUT CONTRAST TECHNIQUE: Multiplanar, multiecho pulse sequences of the brain and surrounding structures were obtained without intravenous contrast. Angiographic images of the head were obtained using MRA technique without contrast. COMPARISON:  Head CT 06/08/2017 Brain MRI 02/09/2012 and 10/27/2010 Carotid ultrasound 10/27/2010 CT neck 12/05/2012 FINDINGS: The study is severely degraded by motion, despite efforts to reduce this artifact, including utilization of motion-resistant MR sequences. The findings of the study are interpreted in the context of reduced sensitivity/specificity. MRI HEAD FINDINGS Brain: The midline structures are normal. No focal diffusion restriction to indicate acute infarct. No intraparenchymal hemorrhage. There is severe atrophy. There are multiple old cerebellar and supratentorial infarcts. No midline shift or mass lesion. There is Wallerian degeneration of the right cerebral peduncle. No chronic microhemorrhage or cerebral amyloid angiopathy. No hydrocephalus, age advanced atrophy or lobar predominant volume loss. No dural abnormality or extra-axial collection. Skull and upper cervical spine: The visualized skull base, calvarium, upper cervical spine and extracranial soft tissues are normal. Sinuses/Orbits: No fluid levels or advanced mucosal thickening. No mastoid effusion. Normal orbits.  MRA HEAD FINDINGS Intracranial internal carotid arteries: There is  no flow related enhancement seen within the right internal carotid artery. On prior brain MRI studies, the right ICA flow void was abnormal and is suspected to be chronic, particularly given the severe volume loss of the right MCA territory. The left ICA is patent. Anterior cerebral arteries: Normal bilateral a 2 segments. Minimal enhancement of right A1 segment. Middle cerebral arteries: Normal left MCA. Minimal to no flow related enhancement of the right MCA. Posterior communicating arteries: Present bilaterally. Posterior cerebral arteries: Limited visualization due to motion, but patent proximally. Poor visualization of the posterior circulation below the level of the posterior cerebral arteries. IMPRESSION: 1. Severely motion degraded examination. Within that limitation, there is no acute ischemia seen on diffusion-weighted imaging. 2. Severely motion degraded intracranial MRA. Occlusion of the right internal carotid artery with little to no flow related enhancement seen in the right middle cerebral artery. This is a chronic occlusion, as demonstrated on prior MRI and CT studies. 3. Minimal appreciable flow related enhancement within the basilar artery, with visualization severely limited by a motion. The basilar artery with severely diminutive on the CT neck performed 12/05/2012. With this degree of motion, it is not possible to determine whether the basilar artery is patent. 4. 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 Electronically Signed   By: Ulyses Jarred M.D.   On: 06/09/2017 06:58    Assessment & Plan:  # 76 year old male patient with multiple medical problems- including multiple strokes and dementia- chronic disease- currently admitted to the hospital for fall- noted to have right-sided thyroid mass multiple lung nodules.  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 After the lengthy  discussion- family decided palliation only. Not interested in any aggressive workup or therapy. I agree- patient is a poor candidate for any therapy is given his multiple comorbidities. I discussed my recommendations with palliative care nurse practitioner Ms.Ihor Dow. After evaluation with palliative care- recommendation is to give a trial of rehabilitation/the transition to hospice if no improvement noted.  # Leukocytosis/infiltrative changes noted on the CT scan- question pneumonia-on antibiotics/ Chronic old strokes/multiple falls/ poor performance status.  Cammie Sickle, MD 06/10/2017  9:30 PM

## 2017-06-10 NOTE — Progress Notes (Signed)
SLP Cancellation Note  Patient Details Name: Ian Townsend MRN: [id number] DOB: 1941-01-27   Cancelled treatment:       Reason Eval/Treat Not Completed: SLP screened, no needs identified, will sign off (chart reviewed; family consulted re: pt's status)  Pt has a long standing h/o Cognitive-Linguistic decline. Family indicated his Cognitive-Linguistic status has declined over years overals several years and he mostly communicates in a Y/N question mode. Pt is oriented to self and family members and is able to indicate basic wants/needs for eating/drinking. Son stated pt appears at his baseline currently.  No further skilled ST services indicated at this time for Cognitive-Linguistic needs. Son to consult primary MD post discharge if needed. NSG/MD updated. Family agreed.     Orinda Kenner, MS, CCC-SLP Azari Hasler 06/10/2017, 11:35 AM

## 2017-06-10 NOTE — Consult Note (Signed)
Consultation Note Date: 06/10/2017   Patient Name: Ian Townsend  DOB: [birth year]  MRN: [id number]  Age / Sex: 76 y.o., male  PCP: Ian Pink, MD Referring Physician: Bettey Costa, MD  Reason for Consultation: Establishing goals of care  HPI/Patient Profile: 76 y.o. male  with past medical history of CVA, seizures, PVD, mitral regurgitation, HTN, HLD, GERD, COPD, CKD, afib, BPH, and smoker admitted on 06/08/2017 with back pain after a fall. In ED, elevated WBC's. Chest xray revealed bilateral airspace consolidation versus pulmonary masses. CT revealed large thyroid mass with tracheal deviation and multiple pulmonary nodules concerning for metastases. Followed by ENT for thyroid nodule since 2013. MRI/MRA brain negative and no RCA occlusion. Initiated on antibiotics for community-acquired pneumonia. Patient complaining of back pain. Xray left spine revealed degenerative disk disease. Neurology recommending pain control, muscle relaxant, and physical therapy. 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 Not a surgical candidate. Palliative medicine consultation for goals of care.   Clinical Assessment and Goals of Care: I have reviewed medical records and discussed with care team. Patient awake, alert, but pleasantly confused. Nurses aid at bedside assisting with bed change therefore met with son and daughter Ian Townsend and Ian Townsend) in family waiting room to discuss diagnosis, prognosis, GOC, EOL wishes, disposition and options.  I introduced Palliative Medicine as specialized medical care for people living with serious illness. It focuses on providing relief from the symptoms and stress of a serious illness. The goal is to improve quality of life for both the patient and the family.  We discussed a brief life review of the patient.  Single. Three adult children, but one daughter has not been involved in 10+ years. Ian Townsend and Ian Townsend are very supportive. 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 Besides work, children describe him as "a hermit" and "would smoke and drink beer all day." He lived with Ian Townsend for many years but eventually was transitioned to an assisted living facility. He has lived at [facility] years. Family friend is an ENT MD and evaluated thyroid mass in 2013. At this time, he was not a candidate for surgery/invasive interventions due to underlying co-morbidities. Ian Townsend speaks of his decline in the last two months with increased weakness and falls, poor appetite, and weight loss. 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 She also noticed the thyroid mass becoming larger.   Discussed hospital diagnoses, interventions, and underlying co-morbidities. Ian Townsend had an extensive conversation with oncologist last night and understand poor prognosis (likely months) due to likely metastatic cancer. She understands he is not a surgical candidate and does not want to pursue aggressive work-up that may impact his quality of life. "We are surprised he has lived this long" due to multiple strokes in the past 10 years and with the thyroid mass starting in 2013.    I attempted to elicit values  and goals of care important to the patient and his children. Ian Townsend and Ian Townsend emphasize "quality of life" over quantity. They want him to be comfortable in the final months of his life. They wish to pursue rehab if this will "give him quality" but don't want to force it on him if it makes him feel worse. We discussed discharge options including hospice services. Ian Townsend and Ian Townsend are interested in hospice services but feel he needs a higher level of care than assisted living due to his high fall risk. We discussed SNF for rehab with hopes of getting back to  ALF with hospice. If not, he may require LTC bed with hospice services in the near future. Educated on palliative services to follow on discharge. Ian Townsend and Ian Townsend understand this can transition to hospice services at any time.   Advanced directives, concepts specific to code status, artifical feeding and hydration, and rehospitalization were considered and discussed. Patient has a documented living will and Ian Townsend is HCPOA. Encouraged she bring documentation to hospital to be scanned into epic. Although Ian Townsend is HCPOA, she does not make decisions without Ian Townsend. They tell me he has spoken his wishes on NOT resuscitating and prolonging his life if he could not return to being independent. Educated and completed MOST form. Ian Townsend and Ian Townsend request DNR/DNI, comfort measures, IV antibiotics and IV fluids as needed for time trial, and no feeding tube. They do not want him to begin a cycle of continued hospitalization. "What kind of life is that?"   Questions and concerns were addressed. Therapeutic listening and emotional support provided.     SUMMARY OF RECOMMENDATIONS    DNR/DNI. Durable DNR placed in chart.   Discussed and completed MOST form--> DNR, comfort measures, IV antibiotics/fluids for time trial, no feeding tube.   Family requesting to attempt SNF for rehab. If he does not tolerate, they are open to hospice services.   Agreeable with palliative services to follow on discharge. Notified SW.   Code Status/Advance Care Planning:  DNR  Symptom Management:   Per attending  Palliative Prophylaxis:   Aspiration, Delirium Protocol, Frequent Pain Assessment, Oral Care and Turn Reposition  Additional Recommendations (Limitations, Scope, Preferences):  DNR/DNI. Focus on quality of life over quantity with focus being on his comfort.   Psycho-social/Spiritual:   Desire for further Chaplaincy support: yes  Additional Recommendations: Caregiving  Support/Resources and Education  on Hospice  Prognosis:   < 6 months: if not less with thyroid nodule concerning for malignancy and pulmonary nodules. High risk for decompensation.   Discharge Planning: To Be Determined likely SNF for rehab with palliative to follow.      Primary Diagnoses: Present on Admission: . Community acquired pneumonia   I have reviewed the medical record, interviewed the patient and family, and examined the patient. The following aspects are pertinent.  Past Medical History:  Diagnosis Date  . Benign prostatic hypertrophy   . Broken arm    left  . Chronic atrial fibrillation (HCC)    a. not on long term anticoagulation 2/2 high risk fall and multiple falls  . Chronic kidney disease    a. stage III; b. baseline SCr approx 1.50  . COPD (chronic obstructive pulmonary disease) ([place])   . Esophageal reflux   . Hyperlipidemia   . Hypertension   . 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 Mitral regurgitation    a. echo 2013: EF 65%, mod MR/TR, LA 4.4 cm, mod dilated RA  .  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 PVD (peripheral vascular disease) ([place])   . Seasonal allergies   . Seizures ([place])    a. at time of last stroke  . Stroke [facility])    a. x2, last approx 2011  . Syncope   . Unresponsiveness 02/08/2012   Social History   Social History  . Marital status: Single    Spouse name: N/A  . Number of children: N/A  . Years of education: N/A   Social History Main Topics  . Smoking status: Current Every Day Smoker    Packs/day: 0.25    Years: 50.00    Types: Cigars, Cigarettes    Last attempt to quit: 08/30/2010  . Smokeless tobacco: Never Used  . Alcohol use No  . Drug use: No  . Sexual activity: Not Asked   Other Topics Concern  . None   Social History Narrative  . None   Family History  Problem Relation Age of Onset  . Diabetes Mellitus II Mother    Scheduled Meds: . aspirin EC  81 mg Oral Daily  . atorvastatin  20 mg Oral Daily  .  cholecalciferol  2,000 Units Oral Daily  . darifenacin  7.5 mg Oral Daily  . diltiazem  120 mg Oral Daily  . enoxaparin (LOVENOX) injection  40 mg Subcutaneous Q24H  . finasteride  5 mg Oral Daily  . FLUoxetine  20 mg Oral Daily  . gabapentin  100 mg Oral QHS  . lisinopril  20 mg Oral Daily  . mometasone-formoterol  2 puff Inhalation BID  . nicotine  14 mg Transdermal Daily  . pantoprazole  40 mg Oral Daily  . phenytoin  50 mg Oral Daily  . phenytoin  100 mg Oral Daily  . senna-docusate  1 tablet Oral BID  . sodium chloride flush  3 mL Intravenous Q12H  . tamsulosin  0.4 mg Oral Daily   Continuous Infusions: . levofloxacin (LEVAQUIN) IV    . piperacillin-tazobactam (ZOSYN)  IV 3.375 g (06/10/17 1210)   PRN Meds:.acetaminophen **OR** acetaminophen, albuterol, ALPRAZolam, bisacodyl, guaiFENesin-dextromethorphan, ipratropium, labetalol, magnesium citrate, ondansetron **OR** ondansetron (ZOFRAN) IV, oxyCODONE, polyethylene glycol Medications Prior to Admission:  Prior to Admission medications   Medication Sig Start Date End Date Taking? Authorizing Provider  acetaminophen (TYLENOL) 325 MG tablet Take 650 mg by mouth every 4 (four) hours as needed for pain or fever.    Yes [provider]  albuterol (PROVENTIL HFA;VENTOLIN HFA) 108 (90 BASE) MCG/ACT inhaler Inhale 1 puff into the lungs every 6 (six) hours as needed for wheezing or shortness of breath.   Yes [provider]  ASPIRIN LOW DOSE 81 MG EC tablet TAKE 1 TABLET BY MOUTH ONCE DAILY 12/25/15  Yes Wellington Hampshire, MD  atorvastatin (LIPITOR) 20 MG tablet Take 20 mg by mouth daily. 9 PM   Yes [provider]  Cholecalciferol (VITAMIN D-3) 1000 UNITS CAPS Take 2 capsules by mouth daily. 9 AM   Yes [provider]  diltiazem (CARDIZEM CD) 120 MG 24 hr capsule Take 1 capsule (120 mg total) by mouth daily. 03/15/15  Yes Henreitta Leber, MD  finasteride (PROSCAR) 5 MG tablet Take 5 mg by mouth daily. 9 am    Yes [provider]  FLUoxetine (PROZAC) 20 MG tablet Take 20 mg by mouth daily. 9 AM   Yes [provider]  Fluticasone-Salmeterol (ADVAIR) 250-50 MCG/DOSE AEPB Inhale 1 puff into the  lungs daily.   Yes [provider]  gabapentin (NEURONTIN) 100 MG capsule Take 1 capsule by mouth at bedtime. 9 AM 06/12/13  Yes [provider]  lisinopril (PRINIVIL,ZESTRIL) 20 MG tablet Take 1 tablet (20 mg total) by mouth daily. 05/14/14  Yes Wellington Hampshire, MD  Melatonin 5 MG TABS Take by mouth daily. 8 PM   Yes [provider]  omeprazole (PRILOSEC) 20 MG capsule Take 20 mg by mouth daily.   Yes [provider]  phenytoin (DILANTIN) 100 MG ER capsule Take 100 mg by mouth daily. 9 AM   Yes [provider]  PHENYTOIN INFATABS 50 MG tablet 50 mg daily. 9 AM 06/12/13  Yes [provider]  polyethylene glycol (MIRALAX / GLYCOLAX) packet Take 17 g by mouth daily. 9 AM   Yes [provider]  solifenacin (VESICARE) 10 MG tablet Take 10 mg by mouth daily. 9 AM   Yes [provider]  tamsulosin (FLOMAX) 0.4 MG CAPS Take 0.4 mg by mouth daily. 9 AM   Yes [provider]  tiotropium (SPIRIVA) 18 MCG inhalation capsule Place 1 capsule (18 mcg total) into inhaler and inhale daily. Patient not taking: Reported on 06/08/2017 03/15/15   Henreitta Leber, MD   Allergies  Allergen Reactions  . Penicillins Hives        Review of Systems  Unable to perform ROS: Dementia   Physical Exam  Constitutional: He is cooperative. He appears ill.  HENT:  Head: Normocephalic and atraumatic.  Cardiovascular: Regular rhythm.   Pulmonary/Chest: No accessory muscle usage. No tachypnea. No respiratory distress. He has decreased breath sounds.  Abdominal: Normal appearance.  Neurological: He is alert.  Pleasantly confused/forgetful   Skin: Skin is warm and dry. There is pallor.  Psychiatric: His speech is delayed. He is inattentive.    Nursing note and vitals reviewed.  Vital Signs: BP (!) 157/69 (BP Location: Left Arm)   Pulse 88   Temp 98.5 F (36.9 C) (Oral)   Resp 20   Ht _0  (1.676 m)   Wt 63.5 kg (140 lb)   SpO2 93%   BMI 22.60 kg/m  Pain Assessment: No/denies pain   Pain Score: 0-No pain  SpO2: SpO2: 93 % O2 Device:SpO2: 93 % O2 Flow Rate: .   IO: Intake/output summary:   Intake/Output Summary (Last 24 hours) at 06/10/17 1533 Last data filed at 06/10/17 0300  Gross per 24 hour  Intake           678.75 ml  Output              610 ml  Net            68.75 ml    LBM: Last BM Date: 06/08/17 (laxative given today ) Baseline Weight: Weight: 63.5 kg (140 lb) Most recent weight: Weight: 63.5 kg (140 lb)     Palliative Assessment/Data: PPS 50%   Flowsheet Rows     Most Recent Value  Intake Tab  Referral Department  Hospitalist  Unit at Time of Referral  Oncology Unit  Palliative Care Primary Diagnosis  Cancer  Palliative Care Type  New Palliative care  Reason for referral  Clarify Goals of Care  Date first seen by Palliative Care  06/10/17  Clinical Assessment  Palliative Performance Scale Score  50%  Psychosocial & Spiritual Assessment  Palliative Care Outcomes  Patient/Family meeting held?  Yes  Who was at the meeting?  patient, daughter, son  Palliative  Care Outcomes  Clarified goals of care, Counseled regarding hospice, Provided psychosocial or spiritual support, ACP counseling assistance, Provided end of life care assistance, Provided advance care planning      Time In: 0915 Time Out: 1045 Time Total: 58mn Greater than 50%  of this time was spent counseling and coordinating care related to the above assessment and plan.  Signed by:  MIhor Dow FNP-C Palliative Medicine Team  Phone: [phone]: [phone]  Please contact Palliative Medicine Team phone at [phone] questions and concerns.  For individual provider: See AShea Evans

## 2017-06-10 NOTE — Progress Notes (Signed)
Pharmacy Antibiotic Note  Ian Townsend is a 76 y.o. male admitted on 06/08/2017 with aspiration PNA.  Pharmacy has been consulted for Zosyn dosing.  Plan: Zosyn 3.375 gm IV Q8H EI  Height: 5\' 6"  (167.6 cm) Weight: 140 lb (63.5 kg) IBW/kg (Calculated) : 63.8  Temp (24hrs), Avg:98.4 F (36.9 C), Min:97.5 F (36.4 C), Max:99.1 F (37.3 C)   Recent Labs Lab 06/08/17 2024 06/09/17 0424 06/10/17 0410  WBC 30.0* 29.6* 32.7*  CREATININE 1.61* 1.43* 1.34*    Estimated Creatinine Clearance: 42.1 mL/min (A) (by C-G formula based on SCr of 1.34 mg/dL (H)).    Allergies  Allergen Reactions  . Penicillins Hives         Thank you for allowing pharmacy to be a part of this patient's care.  Laural Benes, Pharm.D., BCPS Clinical Pharmacist 06/10/2017 11:10 AM

## 2017-06-10 NOTE — Progress Notes (Signed)
Found pt to be resting comfortably with daughter at bedside. Pt daughter states pt has been doing much better and would rather him not wear the Bipap unless he was in distress. Bipap remains at bedside. Discussed conversation with RN Kennyth Lose. Pt will remain off Bipap at this time and will continue to monitor through the night.

## 2017-06-10 NOTE — Progress Notes (Signed)
Patient had increase in shortness of breath with lungs wheezing, coughing with dark red sputum noted, RR 35, no complaints of chest pain,  MD notified, IV fluid D/C and chest x-ray ordered  per MD order, breathing tx given with some improvement. Patient became hypertensive PRN IV hydralazine given with improvement, patient  remain tachypnea, MD notified, MD on floor assessed patient,  Bipap at bedtime ordered per MD, will continue to monitor.

## 2017-06-10 NOTE — Progress Notes (Signed)
OT Cancellation Note  Patient Details Name: Ian Townsend MRN: [id number] DOB: December 24, 1940   Cancelled Treatment:    Reason Eval/Treat Not Completed: Other (comment). Order received, chart reviewed. Spoke with RNCM, pt currently pending a palliative care consult for possible hospice services. RNCM recommending hold OT evaluation at this time and re-attempt at later date/time pending palliative care recommendations. Will continue to follow acutely and evaluate as appropriate.  Jeni Salles, MPH, MS, OTR/L ascom [phone] 06/10/17, 10:29 AM

## 2017-06-10 NOTE — Progress Notes (Signed)
[employer] [facility] [place] [facility]   PATIENT NAME: Ian Townsend    MR#:  [id number]  DATE OF BIRTH:  1941/06/30  SUBJECTIVE:   Patient Had episode of respiratory distress last night he was placed on BiPAP. Patient is now sitting on the chair  appears comfortable Family is at bedside.  palliative care is also at bedside  REVIEW OF SYSTEMS:    Review of Systems  Constitutional: Negative for fever, chills weight loss HENT: Negative for ear pain, nosebleeds, congestion, facial swelling, rhinorrhea, neck pain, neck stiffness and ear discharge.   Respiratory: Positive for cough, shortness of breath, NO wheezing  Cardiovascular: Negative  for chest pain, palpitations and leg swelling.  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  Psychiatric/Behavioral: Negative for hallucinations, confusion, dysphoric mood    Tolerating Diet:yes      DRUG ALLERGIES:   Allergies  Allergen Reactions  . Penicillins Hives         VITALS:  Blood pressure (!) 149/94, pulse (!) 110, temperature (!) 97.5 F (36.4 C), temperature source Axillary, resp. rate 18, height 5\' 6"  (1.676 m), weight 63.5 kg (140 lb), SpO2 97 %.  PHYSICAL EXAMINATION:  Constitutional: Appears frail NO distress. HENT: Normocephalic. Marland Kitchen Oropharynx is clear and moist.  Eyes: Conjunctivae and EOM are normal. PERRLA, no scleral icterus.  Neck: Normal ROM. Neck supple. No JVD. No tracheal deviation. CVS: RRR, S1/S2 +, no murmurs, no gallops, no carotid bruit.  Pulmonary: Effort normal, He has decreased breath sounds with bilateral rhonhii  Abdominal: Soft. BS +,  no distension, tenderness, rebound or guarding.  Musculoskeletal: Normal range of motion. No  edema and no tenderness.  Neuro: Alert oriented to name and place not time. CN 2-12 grossly intact.left sided neglect baseline Moves all extremitites Skin: Skin is warm and dry. No rash noted. Psychiatric: flat affect.      LABORATORY PANEL:   CBC  Recent Labs Lab 06/10/17 0410  WBC 32.7*  HGB 13.0  HCT 37.6*  PLT 292   ------------------------------------------------------------------------------------------------------------------  Chemistries   Recent Labs Lab 06/10/17 0410  NA 137  K 3.9  CL 104  CO2 25  GLUCOSE 172*  BUN 16  CREATININE 1.34*  CALCIUM 10.4*   ------------------------------------------------------------------------------------------------------------------  Cardiac Enzymes  Recent Labs Lab 06/08/17 2024  TROPONINI <0.03   ------------------------------------------------------------------------------------------------------------------  RADIOLOGY:  Dg Chest 2 View  Result Date: 06/08/2017 CLINICAL DATA:  Status post fall. History of hypertension, AFib and mitral regurgitation. EXAM: CHEST  2 VIEW COMPARISON:  03/13/2015 FINDINGS: Cardiomediastinal silhouette is normal. Mediastinal contours appear intact. Calcific atherosclerotic disease of the aorta. There is no evidence of pleural effusion or pneumothorax. Bilateral large patchy areas of airspace consolidation versus pulmonary masses. Osseous structures are without acute abnormality. Soft tissues are grossly normal. IMPRESSION: Bilateral large patchy areas of airspace consolidation versus pulmonary masses. Further evaluation with chest CT, preferably with contrast, is recommended. Electronically Signed   By: Ted Mcalpine M.D.   On: 06/08/2017 19:33   Ct Head Wo Contrast  Result Date: 06/08/2017 CLINICAL DATA:  Status post fall with impact to the back of the head. EXAM: CT HEAD WITHOUT CONTRAST CT CERVICAL SPINE WITHOUT CONTRAST TECHNIQUE: Multidetector CT imaging of the head and  cervical spine was performed following the standard protocol without intravenous contrast. Multiplanar CT image reconstructions of the cervical  spine were also generated. COMPARISON:  06/07/2017 FINDINGS: CT HEAD FINDINGS Brain: No evidence of acute infarction, hemorrhage, hydrocephalus, extra-axial collection or mass lesion/mass effect. Advanced for age brain parenchymal atrophy and periventricular microangiopathy. Prior right basal ganglia and cerebellar lacunar infarcts. Vascular: Calcific atherosclerotic disease at the skullbase. Skull: Normal. Negative for fracture or focal lesion. Sinuses/Orbits: No acute finding. Other: None. CT CERVICAL SPINE FINDINGS Alignment: Normal. Skull base and vertebrae: No acute fracture. No primary bone lesion or focal pathologic process. Soft tissues and spinal canal: No prevertebral fluid or swelling. No visible canal hematoma. Disc levels:  Multilevel osteoarthritic changes. Upper chest: 1.3 cm soft tissue mass in the right upper lobe. Probable bilateral small pleural effusions. Other: Marked enlargement of the right thyroid gland measuring 5.4 by 5.4 by 8.1 cm. Right anterior chain cervical lymphadenopathy. IMPRESSION: No acute intracranial abnormality. Advanced for age brain parenchymal atrophy and chronic microvascular disease. Prior right basal ganglia and cerebellar lacunar infarcts. No evidence of acute traumatic injury to the cervical spine. Multilevel osteoarthritic changes of the cervical spine. 1.3 cm soft tissue mass in the right upper lobe, suspicious for pulmonary primary or metastatic malignancy. Marked enlargement of the right thyroid gland measuring up to 8.1 cm. Right anterior chain cervical lymphadenopathy. Electronically Signed   By: Ted Mcalpine M.D.   On: 06/08/2017 19:44   Ct Head Wo Contrast  Result Date: 06/07/2017 CLINICAL DATA:  Fall EXAM: CT HEAD WITHOUT CONTRAST TECHNIQUE: Contiguous axial images were obtained from the base of the skull  through the vertex without intravenous contrast. COMPARISON:  Head CT 03/16/2017 FINDINGS: Brain: No mass lesion, intraparenchymal hemorrhage or extra-axial collection. No evidence of acute cortical infarct. There is periventricular hypoattenuation compatible with chronic microvascular disease. Old right basal ganglia lacunar infarct. Age advanced cerebral and cerebellar atrophy with ventriculomegaly. Vascular: Atherosclerotic calcification of the vertebral and internal carotid arteries at the skull base. Skull: Normal visualized skull base, calvarium and extracranial soft tissues. Sinuses/Orbits: No sinus fluid levels or advanced mucosal thickening. No mastoid effusion. Normal orbits. IMPRESSION: 1. No acute intracranial abnormality. 2. Age advanced atrophy, chronic hypertensive microangiopathy and old right basal ganglia lacunar infarct. Electronically Signed   By: Deatra Robinson M.D.   On: 06/07/2017 18:38   Ct Chest W Contrast  Result Date: 06/08/2017 CLINICAL DATA:  Cough, unwitnessed fall. EXAM: CT CHEST WITH CONTRAST TECHNIQUE: Multidetector CT imaging of the chest was performed during intravenous contrast administration. CONTRAST:  60mL ISOVUE-300 IOPAMIDOL (ISOVUE-300) INJECTION 61% COMPARISON:  Radiographs of same day.  CT scan of September 22, 2012. FINDINGS: Cardiovascular: Atherosclerosis of thoracic aorta is noted without aneurysm or dissection. Coronary artery calcifications are noted suggesting coronary artery disease. No pericardial effusion is noted. Mediastinum/Nodes: Large right hypodense thyroid mass is noted which is significantly increased in size compared to prior exam, concerning for malignancy. This results in significant displacement of the trachea to the left. 3.2 cm left hilar lymph node is noted. Moderate size hiatal hernia is noted. Lungs/Pleura: Mild bilateral pleural effusions are noted. Multiple pulmonary masses are noted bilaterally consistent with metastatic disease. The largest  on the left measures 7.3 cm posteriorly in the left lower lobe. The largest on the right measures 8.4 cm anteriorly in the right middle lobe. No pneumothorax is noted. Upper Abdomen: No acute abnormality. Musculoskeletal: No chest wall abnormality. No acute or significant osseous findings. IMPRESSION: Large right hypodense thyroid mass is noted which is significantly increased in size compared to prior exam concerning for malignancy. This results in  significant displacement of the trachea to the left. 3.2 cm left hilar lymph node is noted concerning for metastatic disease. Also noted are multiple pulmonary nodules in both lungs with the largest measuring approximately 8 cm in the right middle lobe. These are concerning for metastatic disease is well. Coronary artery calcifications are noted suggesting coronary artery disease. Mild bilateral pleural effusions are noted. Moderate size hiatal hernia is noted. Aortic Atherosclerosis (ICD10-I70.0). Electronically Signed   By: Lupita Raider, M.D.   On: 06/08/2017 22:01   Ct Cervical Spine Wo Contrast  Result Date: 06/08/2017 CLINICAL DATA:  Status post fall with impact to the back of the head. EXAM: CT HEAD WITHOUT CONTRAST CT CERVICAL SPINE WITHOUT CONTRAST TECHNIQUE: Multidetector CT imaging of the head and cervical spine was performed following the standard protocol without intravenous contrast. Multiplanar CT image reconstructions of the cervical spine were also generated. COMPARISON:  06/07/2017 FINDINGS: CT HEAD FINDINGS Brain: No evidence of acute infarction, hemorrhage, hydrocephalus, extra-axial collection or mass lesion/mass effect. Advanced for age brain parenchymal atrophy and periventricular microangiopathy. Prior right basal ganglia and cerebellar lacunar infarcts. Vascular: Calcific atherosclerotic disease at the skullbase. Skull: Normal. Negative for fracture or focal lesion. Sinuses/Orbits: No acute finding. Other: None. CT CERVICAL SPINE FINDINGS  Alignment: Normal. Skull base and vertebrae: No acute fracture. No primary bone lesion or focal pathologic process. Soft tissues and spinal canal: No prevertebral fluid or swelling. No visible canal hematoma. Disc levels:  Multilevel osteoarthritic changes. Upper chest: 1.3 cm soft tissue mass in the right upper lobe. Probable bilateral small pleural effusions. Other: Marked enlargement of the right thyroid gland measuring 5.4 by 5.4 by 8.1 cm. Right anterior chain cervical lymphadenopathy. IMPRESSION: No acute intracranial abnormality. Advanced for age brain parenchymal atrophy and chronic microvascular disease. Prior right basal ganglia and cerebellar lacunar infarcts. No evidence of acute traumatic injury to the cervical spine. Multilevel osteoarthritic changes of the cervical spine. 1.3 cm soft tissue mass in the right upper lobe, suspicious for pulmonary primary or metastatic malignancy. Marked enlargement of the right thyroid gland measuring up to 8.1 cm. Right anterior chain cervical lymphadenopathy. Electronically Signed   By: Ted Mcalpine M.D.   On: 06/08/2017 19:44   Mr Brain Wo Contrast  Result Date: 06/09/2017 CLINICAL DATA:  Concern for stroke.  Encephalopathy. EXAM: MRI HEAD WITHOUT CONTRAST MRA HEAD WITHOUT CONTRAST TECHNIQUE: Multiplanar, multiecho pulse sequences of the brain and surrounding structures were obtained without intravenous contrast. Angiographic images of the head were obtained using MRA technique without contrast. COMPARISON:  Head CT 06/08/2017 Brain MRI 02/09/2012 and 10/27/2010 Carotid ultrasound 10/27/2010 CT neck 12/05/2012 FINDINGS: The study is severely degraded by motion, despite efforts to reduce this artifact, including utilization of motion-resistant MR sequences. The findings of the study are interpreted in the context of reduced sensitivity/specificity. MRI HEAD FINDINGS Brain: The midline structures are normal. No focal diffusion restriction to indicate acute  infarct. No intraparenchymal hemorrhage. There is severe atrophy. There are multiple old cerebellar and supratentorial infarcts. No midline shift or mass lesion. There is Wallerian degeneration of the right cerebral peduncle. No chronic microhemorrhage or cerebral amyloid angiopathy. No hydrocephalus, age advanced atrophy or lobar predominant volume loss. No dural abnormality or extra-axial collection. Skull and upper cervical spine: The visualized skull base, calvarium, upper cervical spine and extracranial soft tissues are normal. Sinuses/Orbits: No fluid levels or advanced mucosal thickening. No mastoid effusion. Normal orbits. MRA HEAD FINDINGS Intracranial internal carotid arteries: There is no flow related enhancement seen  within the right internal carotid artery. On prior brain MRI studies, the right ICA flow void was abnormal and is suspected to be chronic, particularly given the severe volume loss of the right MCA territory. The left ICA is patent. Anterior cerebral arteries: Normal bilateral a 2 segments. Minimal enhancement of right A1 segment. Middle cerebral arteries: Normal left MCA. Minimal to no flow related enhancement of the right MCA. Posterior communicating arteries: Present bilaterally. Posterior cerebral arteries: Limited visualization due to motion, but patent proximally. Poor visualization of the posterior circulation below the level of the posterior cerebral arteries. IMPRESSION: 1. Severely motion degraded examination. Within that limitation, there is no acute ischemia seen on diffusion-weighted imaging. 2. Severely motion degraded intracranial MRA. Occlusion of the right internal carotid artery with little to no flow related enhancement seen in the right middle cerebral artery. This is a chronic occlusion, as demonstrated on prior MRI and CT studies. 3. Minimal appreciable flow related enhancement within the basilar artery, with visualization severely limited by a motion. The basilar  artery with severely diminutive on the CT neck performed 12/05/2012. With this degree of motion, it is not possible to determine whether the basilar artery is patent. 4. 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 Electronically Signed   By: Deatra Robinson M.D.   On: 06/09/2017 06:58   US Carotid Bilateral (at [facility] Only)  Result Date: 06/09/2017 CLINICAL DATA:  Altered mental status. EXAM: BILATERAL CAROTID DUPLEX ULTRASOUND TECHNIQUE: Matura scale imaging, color Doppler and duplex ultrasound were performed of bilateral carotid and vertebral arteries in the neck. COMPARISON:  MRI 06/09/2017. Head CT 06/08/2017. Neck CT 11/25/2012 . Thyroid ultrasound 04/14/2013 FINDINGS: Criteria: Quantification of carotid stenosis is based on velocity parameters that correlate the residual internal carotid diameter with NASCET-based stenosis levels, using the diameter of the distal internal carotid lumen as the denominator for stenosis measurement. The following velocity measurements were obtained: RIGHT ICA:  Complete occlusion. CCA:  120/14 cm/sec ECA:  136 cm/sec LEFT ICA:  173/35 cm/sec CCA:  119/15 cm/sec SYSTOLIC ICA/CCA RATIO:  1.5 DIASTOLIC ICA/CCA RATIO:  2.4 ECA:  98 cm/sec RIGHT CAROTID ARTERY: Diffuse mild right common carotid and carotid bifurcation disease noted. Complete occlusion of the right internal carotid artery appears to be present. RIGHT VERTEBRAL ARTERY:  Patent with antegrade flow. LEFT CAROTID ARTERY: Moderate left common carotid and carotid bifurcation atherosclerotic vascular plaque. LEFT VERTEBRAL ARTERY:  Patent with antegrade flow. Complex cystic mass incidentally noted in the right thyroid gland. Patient had a thyroid ultrasound on 04/14/2013 was abnormal. Repeat thyroid ultrasound should be considered for continued evaluation. IMPRESSION: 1.  Complete occlusion of the right ICA. 2. Moderate left carotid bifurcation atherosclerotic vascular disease with degree of  stenosis 50- 69%. Given the probable complete occlusion of the right ICA and severity of the disease at the left carotid bifurcation, patient may benefit from carotid CTA. 3. Vertebrals are patent with antegrade flow. 4. Complex cystic mass right thyroid gland. Patient had a abnormal thyroid ultrasound 04/14/2013. Repeat thyroid ultrasound should be considered for continued evaluation. Electronically Signed   By: Maisie Fus  Register   On: 06/09/2017 11:04   Mr Maxine Glenn Head Wo Contrast  Result Date: 06/09/2017 CLINICAL DATA:  Concern for stroke.  Encephalopathy. EXAM: MRI HEAD WITHOUT CONTRAST MRA HEAD WITHOUT CONTRAST TECHNIQUE: Multiplanar, multiecho pulse sequences of the brain and surrounding structures were obtained without intravenous contrast. Angiographic images of the head were obtained using MRA technique without contrast. COMPARISON:  Head  CT 06/08/2017 Brain MRI 02/09/2012 and 10/27/2010 Carotid ultrasound 10/27/2010 CT neck 12/05/2012 FINDINGS: The study is severely degraded by motion, despite efforts to reduce this artifact, including utilization of motion-resistant MR sequences. The findings of the study are interpreted in the context of reduced sensitivity/specificity. MRI HEAD FINDINGS Brain: The midline structures are normal. No focal diffusion restriction to indicate acute infarct. No intraparenchymal hemorrhage. There is severe atrophy. There are multiple old cerebellar and supratentorial infarcts. No midline shift or mass lesion. There is Wallerian degeneration of the right cerebral peduncle. No chronic microhemorrhage or cerebral amyloid angiopathy. No hydrocephalus, age advanced atrophy or lobar predominant volume loss. No dural abnormality or extra-axial collection. Skull and upper cervical spine: The visualized skull base, calvarium, upper cervical spine and extracranial soft tissues are normal. Sinuses/Orbits: No fluid levels or advanced mucosal thickening. No mastoid effusion. Normal orbits.  MRA HEAD FINDINGS Intracranial internal carotid arteries: There is no flow related enhancement seen within the right internal carotid artery. On prior brain MRI studies, the right ICA flow void was abnormal and is suspected to be chronic, particularly given the severe volume loss of the right MCA territory. The left ICA is patent. Anterior cerebral arteries: Normal bilateral a 2 segments. Minimal enhancement of right A1 segment. Middle cerebral arteries: Normal left MCA. Minimal to no flow related enhancement of the right MCA. Posterior communicating arteries: Present bilaterally. Posterior cerebral arteries: Limited visualization due to motion, but patent proximally. Poor visualization of the posterior circulation below the level of the posterior cerebral arteries. IMPRESSION: 1. Severely motion degraded examination. Within that limitation, there is no acute ischemia seen on diffusion-weighted imaging. 2. Severely motion degraded intracranial MRA. Occlusion of the right internal carotid artery with little to no flow related enhancement seen in the right middle cerebral artery. This is a chronic occlusion, as demonstrated on prior MRI and CT studies. 3. Minimal appreciable flow related enhancement within the basilar artery, with visualization severely limited by a motion. The basilar artery with severely diminutive on the CT neck performed 12/05/2012. With this degree of motion, it is not possible to determine whether the basilar artery is patent. 4. 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 Electronically Signed   By: Deatra Robinson M.D.   On: 06/09/2017 06:58     ASSESSMENT AND PLAN:   76 y.o. male with a known history of BPH, Afib, CKD3, GERD, COPD, GERD, HLD, HTN,  PVD, CVA, seizures presents to the emergency department for evaluation of back pain s/p fall and found to have on chest x-ray bilateral large patchy areas of airspace disease.   1.  sepsis with leukocytosis and  tachypnea due to Community-acquired pneumonia: Continue Levaquin and follow up on final blood cultures I will also add Zosyn to cover for postobstructive/aspiration pneumonia    2.  Large right hypodense thyroid mass is noted which is significantly increased in size compared to prior exam concerning for malignancy. This results in significant displacement of the trachea to the left. 3.2 cm left hilar lymph node is noted concerning for metastatic disease. Also noted are multiple pulmonary nodules in both lungs with the largest measuring approximately 8 cm in the right middle lobe. These are concerning for metastatic disease is well  This is been followed by ENT however metastatic disease was unknown. Patient was evaluated by palliative care and oncology. Family does not want more aggressive care.   3. Recent falls and back pain: Neurological workup including MRI/MRA of the brain shows no  acute pathology. He has known occlusion of the right carotid artery.  lumbar x-ray shows no acute fracture  4. History of CVA with left-sided neglect: Continue aspirin and statin  5. BPH: Continue finasteride and tamsulosin  6. Chronic kidney disease stage III: Creatinine at baseline  7. PAF: Continue diltiazem for heart rate control  8. Essential hypertension: Continue lisinopril and diltiazem  9. History of seizures: Continue Dilantin  10. COPD without exacerbation: Continue inhalers  Management plans discussed with the patient's family  and he is in agreement.  CODE STATUS: FULL  TOTAL TIME TAKING CARE OF THIS PATIENT: 30 minutes.    discussed with palliative care   Patient may be able to be discharged to skilled nursing facility tomorrow with palliative care following his outpatient with possible hospice in the future.  POSSIBLE D/C 1-2 days, DEPENDING ON CLINICAL CONDITION.   Aine Strycharz M.D on 06/10/2017 at 11:00 AM  Between 7am to 6pm - Pager - [phone] After 6pm go to [email or web address]  - password [email or web address]  [facility] [place] [facility]  Office  [phone]  CC: Primary care physician; Jerl Mina, MD  Note: This dictation was prepared with Dragon dictation along with smaller phrase technology. Any transcriptional errors that result from this process are unintentional.

## 2017-06-10 NOTE — Clinical Social Work Note (Addendum)
CSW consulted as pt was admitted from [facility]. PT is recommending SNF. Per [facility]alla[facility]'s note, pt's family would like for pt to attempt rehab at that time Palliative Care will follow at SNF. Per RNCM, pt's family is contemplating hospice services. CSW spoke with [facility] and an assessment will need to be done prior to pt's returning. Full assessment to follow to address discharge plan. CSW will continue to follow.   Darden Dates, MSW, LCSW  Clinical Social Worker  [phone]

## 2017-06-10 NOTE — Progress Notes (Signed)
Called Dr Benjie Karvonen patient had a small amount of hematuria, she spoke with patient daughter.  No new orders will continue to monitor.

## 2017-06-10 NOTE — Progress Notes (Signed)
Pt. Pulling mask off face. Not tolerating BIPAP at this time. BIPAP removed and placed on standby at bedside. SpO2 96% on room air. RR 26 will continue to monitor.

## 2017-06-11 LAB — CULTURE, RESPIRATORY: SPECIAL REQUESTS: NORMAL

## 2017-06-11 LAB — CULTURE, RESPIRATORY W GRAM STAIN

## 2017-06-11 NOTE — Progress Notes (Signed)
[facility] [place] NAME: Ian Townsend    MR#:  [id number]  DATE OF BIRTH:  [birth year]  SUBJECTIVE:   Patient has no complaints. But he has chronic cough and very poor oral intake per his daughter and son.  REVIEW OF SYSTEMS:    Review of Systems  Constitutional: Negative for fever, chills weight loss HENT: Negative for ear pain, nosebleeds, congestion, facial swelling, rhinorrhea, neck pain, neck stiffness and ear discharge.   Respiratory: Positive for cough, shortness of breath, NO wheezing  Cardiovascular: Negative  for chest pain, palpitations and leg swelling.  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  Psychiatric/Behavioral: Negative for hallucinations, confusion, dysphoric mood    Tolerating Diet:yes      DRUG ALLERGIES:   Allergies  Allergen Reactions  . Penicillins Hives         VITALS:  Blood pressure (!) 142/65, pulse (!) 109, temperature 99.4 F (37.4 C), temperature source Oral, resp. rate 20, height 5\' 6"  (1.676 m), weight 140 lb (63.5 kg), SpO2 92 %.  PHYSICAL EXAMINATION:  Constitutional: Appears frail NO distress. HENT: Normocephalic. Marland Kitchen Oropharynx is clear and moist.  Eyes: Conjunctivae and EOM are normal. PERRLA, no scleral icterus.  Neck: Normal ROM. Neck supple. No JVD. No tracheal deviation. CVS: RRR, S1/S2 +, no murmurs, no gallops, no carotid bruit.  Pulmonary: Effort normal, He has decreased breath sounds without rhonhii  Abdominal: Soft. BS +,  no distension, tenderness, rebound or guarding.  Musculoskeletal: Normal range of motion. No edema and no tenderness.  Neuro: Alert oriented to name and place not time. CN 2-12 grossly intact.left  sided neglect baseline Moves all extremitites Skin: Skin is warm and dry. No rash noted. Psychiatric: flat affect.      LABORATORY PANEL:   CBC  Recent Labs Lab 06/10/17 0410  WBC 32.7*  HGB 13.0  HCT 37.6*  PLT 292   ------------------------------------------------------------------------------------------------------------------  Chemistries   Recent Labs Lab 06/10/17 0410  NA 137  K 3.9  CL 104  CO2 25  GLUCOSE 172*  BUN 16  CREATININE 1.34*  CALCIUM 10.4*   ------------------------------------------------------------------------------------------------------------------  Cardiac Enzymes  Recent Labs Lab 06/08/17 2024  TROPONINI <0.03   ------------------------------------------------------------------------------------------------------------------  RADIOLOGY:  Dg Chest 2 View  Result Date: 06/08/2017 CLINICAL DATA:  Status post fall. History of hypertension, AFib and mitral regurgitation. EXAM: CHEST  2 VIEW COMPARISON:  03/13/2015 FINDINGS: Cardiomediastinal silhouette is normal. Mediastinal contours appear intact. Calcific atherosclerotic disease of the aorta. There is no evidence of pleural effusion or pneumothorax. Bilateral large patchy areas of airspace consolidation versus pulmonary masses. Osseous structures are without acute abnormality. Soft tissues are grossly normal. IMPRESSION: Bilateral large patchy areas of airspace consolidation versus pulmonary masses. Further evaluation with chest CT, preferably with contrast, is recommended. Electronically Signed   By: Fidela Salisbury M.D.   On: 06/08/2017 19:33   Ct Head Wo Contrast  Result Date: 06/08/2017 CLINICAL DATA:  Status post fall with impact to the back of the head. EXAM: CT HEAD WITHOUT CONTRAST CT CERVICAL SPINE WITHOUT CONTRAST TECHNIQUE: Multidetector CT imaging of the head and cervical spine was performed following the standard protocol without intravenous contrast. Multiplanar CT image  reconstructions of the cervical spine were also generated. COMPARISON:  06/07/2017 FINDINGS: CT HEAD FINDINGS Brain: No evidence of acute infarction,  hemorrhage, hydrocephalus, extra-axial collection or mass lesion/mass effect. Advanced for age brain parenchymal atrophy and periventricular microangiopathy. Prior right basal ganglia and cerebellar lacunar infarcts. Vascular: Calcific atherosclerotic disease at the skullbase. Skull: Normal. Negative for fracture or focal lesion. Sinuses/Orbits: No acute finding. Other: None. CT CERVICAL SPINE FINDINGS Alignment: Normal. Skull base and vertebrae: No acute fracture. No primary bone lesion or focal pathologic process. Soft tissues and spinal canal: No prevertebral fluid or swelling. No visible canal hematoma. Disc levels:  Multilevel osteoarthritic changes. Upper chest: 1.3 cm soft tissue mass in the right upper lobe. Probable bilateral small pleural effusions. Other: Marked enlargement of the right thyroid gland measuring 5.4 by 5.4 by 8.1 cm. Right anterior chain cervical lymphadenopathy. IMPRESSION: No acute intracranial abnormality. Advanced for age brain parenchymal atrophy and chronic microvascular disease. Prior right basal ganglia and cerebellar lacunar infarcts. No evidence of acute traumatic injury to the cervical spine. Multilevel osteoarthritic changes of the cervical spine. 1.3 cm soft tissue mass in the right upper lobe, suspicious for pulmonary primary or metastatic malignancy. Marked enlargement of the right thyroid gland measuring up to 8.1 cm. Right anterior chain cervical lymphadenopathy. Electronically Signed   By: Fidela Salisbury M.D.   On: 06/08/2017 19:44   Ct Head Wo Contrast  Result Date: 06/07/2017 CLINICAL DATA:  Fall EXAM: CT HEAD WITHOUT CONTRAST TECHNIQUE: Contiguous axial images were obtained from the base of the skull through the vertex without intravenous contrast. COMPARISON:  Head CT 03/16/2017 FINDINGS: Brain: No mass lesion,  intraparenchymal hemorrhage or extra-axial collection. No evidence of acute cortical infarct. There is periventricular hypoattenuation compatible with chronic microvascular disease. Old right basal ganglia lacunar infarct. Age advanced cerebral and cerebellar atrophy with ventriculomegaly. Vascular: Atherosclerotic calcification of the vertebral and internal carotid arteries at the skull base. Skull: Normal visualized skull base, calvarium and extracranial soft tissues. Sinuses/Orbits: No sinus fluid levels or advanced mucosal thickening. No mastoid effusion. Normal orbits. IMPRESSION: 1. No acute intracranial abnormality. 2. Age advanced atrophy, chronic hypertensive microangiopathy and old right basal ganglia lacunar infarct. Electronically Signed   By: Ulyses Jarred M.D.   On: 06/07/2017 18:38   Ct Chest W Contrast  Result Date: 06/08/2017 CLINICAL DATA:  Cough, unwitnessed fall. EXAM: CT CHEST WITH CONTRAST TECHNIQUE: Multidetector CT imaging of the chest was performed during intravenous contrast administration. CONTRAST:  31mL ISOVUE-300 IOPAMIDOL (ISOVUE-300) INJECTION 61% COMPARISON:  Radiographs of same day.  CT scan of September 22, 2012. FINDINGS: Cardiovascular: Atherosclerosis of thoracic aorta is noted without aneurysm or dissection. Coronary artery calcifications are noted suggesting coronary artery disease. No pericardial effusion is noted. Mediastinum/Nodes: Large right hypodense thyroid mass is noted which is significantly increased in size compared to prior exam, concerning for malignancy. This results in significant displacement of the trachea to the left. 3.2 cm left hilar lymph node is noted. Moderate size hiatal hernia is noted. Lungs/Pleura: Mild bilateral pleural effusions are noted. Multiple pulmonary masses are noted bilaterally consistent with metastatic disease. The largest on the left measures 7.3 cm posteriorly in the left lower lobe. The largest on the right measures 8.4 cm  anteriorly in the right middle lobe. No pneumothorax is noted. Upper Abdomen: No acute abnormality. Musculoskeletal: No chest wall abnormality. No acute or significant osseous findings. IMPRESSION: Large right hypodense thyroid mass is noted which is significantly increased in size compared to prior exam concerning for malignancy. This results in significant displacement of the trachea to the left. 3.2 cm left hilar lymph node is noted concerning  for metastatic disease. Also noted are multiple pulmonary nodules in both lungs with the largest measuring approximately 8 cm in the right middle lobe. These are concerning for metastatic disease is well. Coronary artery calcifications are noted suggesting coronary artery disease. Mild bilateral pleural effusions are noted. Moderate size hiatal hernia is noted. Aortic Atherosclerosis (ICD10-I70.0). Electronically Signed   By: Marijo Conception, M.D.   On: 06/08/2017 22:01   Ct Cervical Spine Wo Contrast  Result Date: 06/08/2017 CLINICAL DATA:  Status post fall with impact to the back of the head. EXAM: CT HEAD WITHOUT CONTRAST CT CERVICAL SPINE WITHOUT CONTRAST TECHNIQUE: Multidetector CT imaging of the head and cervical spine was performed following the standard protocol without intravenous contrast. Multiplanar CT image reconstructions of the cervical spine were also generated. COMPARISON:  06/07/2017 FINDINGS: CT HEAD FINDINGS Brain: No evidence of acute infarction, hemorrhage, hydrocephalus, extra-axial collection or mass lesion/mass effect. Advanced for age brain parenchymal atrophy and periventricular microangiopathy. Prior right basal ganglia and cerebellar lacunar infarcts. Vascular: Calcific atherosclerotic disease at the skullbase. Skull: Normal. Negative for fracture or focal lesion. Sinuses/Orbits: No acute finding. Other: None. CT CERVICAL SPINE FINDINGS Alignment: Normal. Skull base and vertebrae: No acute fracture. No primary bone lesion or focal pathologic  process. Soft tissues and spinal canal: No prevertebral fluid or swelling. No visible canal hematoma. Disc levels:  Multilevel osteoarthritic changes. Upper chest: 1.3 cm soft tissue mass in the right upper lobe. Probable bilateral small pleural effusions. Other: Marked enlargement of the right thyroid gland measuring 5.4 by 5.4 by 8.1 cm. Right anterior chain cervical lymphadenopathy. IMPRESSION: No acute intracranial abnormality. Advanced for age brain parenchymal atrophy and chronic microvascular disease. Prior right basal ganglia and cerebellar lacunar infarcts. No evidence of acute traumatic injury to the cervical spine. Multilevel osteoarthritic changes of the cervical spine. 1.3 cm soft tissue mass in the right upper lobe, suspicious for pulmonary primary or metastatic malignancy. Marked enlargement of the right thyroid gland measuring up to 8.1 cm. Right anterior chain cervical lymphadenopathy. Electronically Signed   By: Fidela Salisbury M.D.   On: 06/08/2017 19:44   Mr Brain Wo Contrast  Result Date: 06/09/2017 CLINICAL DATA:  Concern for stroke.  Encephalopathy. EXAM: MRI HEAD WITHOUT CONTRAST MRA HEAD WITHOUT CONTRAST TECHNIQUE: Multiplanar, multiecho pulse sequences of the brain and surrounding structures were obtained without intravenous contrast. Angiographic images of the head were obtained using MRA technique without contrast. COMPARISON:  Head CT 06/08/2017 Brain MRI 02/09/2012 and 10/27/2010 Carotid ultrasound 10/27/2010 CT neck 12/05/2012 FINDINGS: The study is severely degraded by motion, despite efforts to reduce this artifact, including utilization of motion-resistant MR sequences. The findings of the study are interpreted in the context of reduced sensitivity/specificity. MRI HEAD FINDINGS Brain: The midline structures are normal. No focal diffusion restriction to indicate acute infarct. No intraparenchymal hemorrhage. There is severe atrophy. There are multiple old cerebellar and  supratentorial infarcts. No midline shift or mass lesion. There is Wallerian degeneration of the right cerebral peduncle. No chronic microhemorrhage or cerebral amyloid angiopathy. No hydrocephalus, age advanced atrophy or lobar predominant volume loss. No dural abnormality or extra-axial collection. Skull and upper cervical spine: The visualized skull base, calvarium, upper cervical spine and extracranial soft tissues are normal. Sinuses/Orbits: No fluid levels or advanced mucosal thickening. No mastoid effusion. Normal orbits. MRA HEAD FINDINGS Intracranial internal carotid arteries: There is no flow related enhancement seen within the right internal carotid artery. On prior brain MRI studies, the right ICA flow void was  abnormal and is suspected to be chronic, particularly given the severe volume loss of the right MCA territory. The left ICA is patent. Anterior cerebral arteries: Normal bilateral a 2 segments. Minimal enhancement of right A1 segment. Middle cerebral arteries: Normal left MCA. Minimal to no flow related enhancement of the right MCA. Posterior communicating arteries: Present bilaterally. Posterior cerebral arteries: Limited visualization due to motion, but patent proximally. Poor visualization of the posterior circulation below the level of the posterior cerebral arteries. IMPRESSION: 1. Severely motion degraded examination. Within that limitation, there is no acute ischemia seen on diffusion-weighted imaging. 2. Severely motion degraded intracranial MRA. Occlusion of the right internal carotid artery with little to no flow related enhancement seen in the right middle cerebral artery. This is a chronic occlusion, as demonstrated on prior MRI and CT studies. 3. Minimal appreciable flow related enhancement within the basilar artery, with visualization severely limited by a motion. The basilar artery with severely diminutive on the CT neck performed 12/05/2012. With this degree of motion, it is not  possible to determine whether the basilar artery is patent. 4. 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 Electronically Signed   By: Ulyses Jarred M.D.   On: 06/09/2017 06:58   US Carotid Bilateral (at [facility] Only)  Result Date: 06/09/2017 CLINICAL DATA:  Altered mental status. EXAM: BILATERAL CAROTID DUPLEX ULTRASOUND TECHNIQUE: Elison scale imaging, color Doppler and duplex ultrasound were performed of bilateral carotid and vertebral arteries in the neck. COMPARISON:  MRI 06/09/2017. Head CT 06/08/2017. Neck CT 11/25/2012 . Thyroid ultrasound 04/14/2013 FINDINGS: Criteria: Quantification of carotid stenosis is based on velocity parameters that correlate the residual internal carotid diameter with NASCET-based stenosis levels, using the diameter of the distal internal carotid lumen as the denominator for stenosis measurement. The following velocity measurements were obtained: RIGHT ICA:  Complete occlusion. CCA:  120/14 cm/sec ECA:  136 cm/sec LEFT ICA:  173/35 cm/sec CCA:  272/53 cm/sec SYSTOLIC ICA/CCA RATIO:  1.5 DIASTOLIC ICA/CCA RATIO:  2.4 ECA:  98 cm/sec RIGHT CAROTID ARTERY: Diffuse mild right common carotid and carotid bifurcation disease noted. Complete occlusion of the right internal carotid artery appears to be present. RIGHT VERTEBRAL ARTERY:  Patent with antegrade flow. LEFT CAROTID ARTERY: Moderate left common carotid and carotid bifurcation atherosclerotic vascular plaque. LEFT VERTEBRAL ARTERY:  Patent with antegrade flow. Complex cystic mass incidentally noted in the right thyroid gland. Patient had a thyroid ultrasound on 04/14/2013 was abnormal. Repeat thyroid ultrasound should be considered for continued evaluation. IMPRESSION: 1.  Complete occlusion of the right ICA. 2. Moderate left carotid bifurcation atherosclerotic vascular disease with degree of stenosis 50- 69%. Given the probable complete occlusion of the right ICA and severity of the disease at the  left carotid bifurcation, patient may benefit from carotid CTA. 3. Vertebrals are patent with antegrade flow. 4. Complex cystic mass right thyroid gland. Patient had a abnormal thyroid ultrasound 04/14/2013. Repeat thyroid ultrasound should be considered for continued evaluation. Electronically Signed   By: Marcello Moores  Register   On: 06/09/2017 11:04   Mr Jodene Nam Head Wo Contrast  Result Date: 06/09/2017 CLINICAL DATA:  Concern for stroke.  Encephalopathy. EXAM: MRI HEAD WITHOUT CONTRAST MRA HEAD WITHOUT CONTRAST TECHNIQUE: Multiplanar, multiecho pulse sequences of the brain and surrounding structures were obtained without intravenous contrast. Angiographic images of the head were obtained using MRA technique without contrast. COMPARISON:  Head CT 06/08/2017 Brain MRI 02/09/2012 and 10/27/2010 Carotid ultrasound 10/27/2010 CT neck 12/05/2012 FINDINGS: The study is  severely degraded by motion, despite efforts to reduce this artifact, including utilization of motion-resistant MR sequences. The findings of the study are interpreted in the context of reduced sensitivity/specificity. MRI HEAD FINDINGS Brain: The midline structures are normal. No focal diffusion restriction to indicate acute infarct. No intraparenchymal hemorrhage. There is severe atrophy. There are multiple old cerebellar and supratentorial infarcts. No midline shift or mass lesion. There is Wallerian degeneration of the right cerebral peduncle. No chronic microhemorrhage or cerebral amyloid angiopathy. No hydrocephalus, age advanced atrophy or lobar predominant volume loss. No dural abnormality or extra-axial collection. Skull and upper cervical spine: The visualized skull base, calvarium, upper cervical spine and extracranial soft tissues are normal. Sinuses/Orbits: No fluid levels or advanced mucosal thickening. No mastoid effusion. Normal orbits. MRA HEAD FINDINGS Intracranial internal carotid arteries: There is no flow related enhancement seen within  the right internal carotid artery. On prior brain MRI studies, the right ICA flow void was abnormal and is suspected to be chronic, particularly given the severe volume loss of the right MCA territory. The left ICA is patent. Anterior cerebral arteries: Normal bilateral a 2 segments. Minimal enhancement of right A1 segment. Middle cerebral arteries: Normal left MCA. Minimal to no flow related enhancement of the right MCA. Posterior communicating arteries: Present bilaterally. Posterior cerebral arteries: Limited visualization due to motion, but patent proximally. Poor visualization of the posterior circulation below the level of the posterior cerebral arteries. IMPRESSION: 1. Severely motion degraded examination. Within that limitation, there is no acute ischemia seen on diffusion-weighted imaging. 2. Severely motion degraded intracranial MRA. Occlusion of the right internal carotid artery with little to no flow related enhancement seen in the right middle cerebral artery. This is a chronic occlusion, as demonstrated on prior MRI and CT studies. 3. Minimal appreciable flow related enhancement within the basilar artery, with visualization severely limited by a motion. The basilar artery with severely diminutive on the CT neck performed 12/05/2012. With this degree of motion, it is not possible to determine whether the basilar artery is patent. 4. 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 Electronically Signed   By: Ulyses Jarred M.D.   On: 06/09/2017 06:58     ASSESSMENT AND PLAN:   76 y.o. male with a known history of BPH, Afib, CKD3, GERD, COPD, GERD, HLD, HTN,  PVD, CVA, seizures presents to the emergency department for evaluation of back pain s/p fall and found to have on chest x-ray bilateral large patchy areas of airspace disease.   1.  sepsis with leukocytosis and tachypnea due to Community-acquired pneumonia: Continue Levaquin and Zosyn, follow up on final blood  cultures Dr. Benjie Karvonen added Zosyn to cover for postobstructive/aspiration pneumonia. Still leukocytosis at 32,000. Follow-up CBC.  2.  Large right hypodense thyroid mass is noted which is significantly increased in size compared to prior exam concerning for malignancy. This results in significant displacement of the trachea to the left. 3.2 cm left hilar lymph node is noted concerning for metastatic disease. Also noted are multiple pulmonary nodules in both lungs with the largest measuring approximately 8 cm in the right middle lobe. These are concerning for metastatic disease as well  This is been followed by ENT however metastatic disease was unknown. Patient was evaluated by palliative care and oncology. Family does not want more aggressive care.   3. Recent falls and back pain: Neurological workup including MRI/MRA of the brain shows no acute pathology. He has known occlusion of the right carotid artery.  lumbar  x-ray shows no acute fracture  4. History of CVA with left-sided neglect: Continue aspirin and statin  5. BPH: Continue finasteride and tamsulosin  6. Chronic kidney disease stage III: Creatinine at baseline  7. PAF: Continue diltiazem for heart rate control  8. Essential hypertension: Continue lisinopril and diltiazem  9. History of seizures: Continue Dilantin  10. COPD without exacerbation: Continue inhalers  Management plans discussed with the patient's daughter and the son, they are in agreement.  CODE STATUS: FULL  TOTAL TIME TAKING CARE OF THIS PATIENT: 33 minutes.    discussed with palliative care   Patient may be able to be discharged to skilled nursing facility tomorrow with palliative care following his outpatient with possible hospice in the future.  POSSIBLE D/C 1-2 days, DEPENDING ON CLINICAL CONDITION.   Demetrios Loll M.D on 06/11/2017 at 5:46 PM  Between 7am to 6pm - Pager - [phone] After 6pm go to [email or web address] - password [email or web address]  Office  [phone]  CC: Primary care physician; [place] Pink, MD  Note: This dictation was prepared with Dragon dictation along with smaller phrase technology. Any transcriptional errors that result from this process are unintentional.

## 2017-06-11 NOTE — Discharge Instructions (Signed)
Fall and aspiration precaution. Palliative care at skilled nursing facility.

## 2017-06-11 NOTE — Progress Notes (Signed)
Occupational Therapy Treatment Patient Details Name: Ian Townsend MRN: [id number] DOB: 1940/10/21 Today's Date: 06/11/2017    History of present illness Ian Townsend is a 76 y.o. male with a known history of BPH, Afib, CKD3, GERD, COPD, GERD, HLD, HTN,  PVD, CVA, seizures presents to the emergency department for evaluation of back pain s/p fall.  This is his second fall in two days.  Yesterday he sustained a mechanical fall and hit the back of his head, no LOC. He was seen in the ED and discharged home. Today he had a similar fall except that he landed on his bottom after his walker got out from under him, no head trauma, no LOC. Patient's only complaints are headache and slight cough.  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 Lengthy conversation with the patient's son reveals concern for his psychomotor agitation, weakness and gait instability, symptoms that are similar to when he had a previous stroke. Patient's son states that ENT Dr. Tami Ribas has been following the thyroid mass, but risk of surgery outweighs the benefit.  Family was unaware of metastases. Pt is now admitted for sepsis secondary to CAP, R thyroid mass, and recent falls.    OT comments  Pt on and off alert and falls asleep mid activity.  Able to participate in [place] in bed but not willing to sit EOB or get up to chair this session.  He presents with tongue protrusion and deviation to L with dried lips and tongue with dried blood.  His daughter stated he has been coughing up blood.  Provided lip moisturizer to lips since pt was not able to coordinate hand to mouth using either hand.  Spoke to his son and daughter about goals of OT and if they continue to want OT services and they do and are  hopefull he will make a turn in status but aware that he has basically stopped eating and drinking since Monday.  Will continue to provide support to family and work on basic ADLs and strengthening as tolerated but prognosis is poor.  Rec OT re-assess at next session if pt is making functional progress with OT.   Follow Up Recommendations  SNF    Equipment Recommendations  None recommended by OT    Recommendations for Other Services      Precautions / Restrictions Precautions Precautions: Fall Restrictions Weight Bearing Restrictions: No       Mobility Bed Mobility                  Transfers                      Balance                                           ADL either performed or assessed with clinical judgement   ADL Overall ADL's : Needs assistance/impaired                                       General ADL Comments: Pt on and off alert and falls asleep  mid activity.  Able to participate in [place] in bed but not willing to sit EOB or get up to chair this session.  He presents with tongue protrusion and deviation to L with dried lips and tongue with dried blood.  His daughter stated he has been coughing up blood.  Provided lip moisturizer to lips since pt was not able to coordinate hand to mouth using either hand.  Spoke to his son and daughter about goals of OT and if they continue to want OT services and they do and are hopefull he will make a turn in status but aware that he has basically stopped eating and drinking since Monday.  Will continue to provide support to family and work on basic ADLs and strengthening as tolerated but prognosis is poor.      Vision Baseline Vision/History: Wears glasses Wears Glasses: Reading only Patient Visual Report: No change from baseline Vision Assessment?: No apparent visual deficits   Perception     Praxis      Cognition Arousal/Alertness: Lethargic Behavior During  Therapy: Flat affect;Restless Overall Cognitive Status: Impaired/Different from baseline Area of Impairment: Orientation;Safety/judgement;Attention;Following commands                 Orientation Level: Disoriented to;Time;Situation;Place       Safety/Judgement: Decreased awareness of deficits     General Comments: L side neglect noted (at baseline); very lethargic/fatigued        Exercises     Shoulder Instructions       General Comments      Pertinent Vitals/ Pain       Pain Assessment: No/denies pain  Home Living                                          Prior Functioning/Environment              Frequency  Min 1X/week        Progress Toward Goals  OT Goals(current goals can now be found in the care plan section)  Progress towards OT goals: OT to reassess next treatment  Acute Rehab OT Goals Patient Stated Goal: Return to prior function OT Goal Formulation: With patient/family  Plan Discharge plan remains appropriate (concerned about progress and may need LTACH vs SNF if stamina and alertness do not improve)    Co-evaluation                 AM-PAC PT "6 Clicks" Daily Activity     Outcome Measure   Help from another person eating meals?: A Little Help from another person taking care of personal grooming?: A Lot Help from another person toileting, which includes using toliet, bedpan, or urinal?: A Lot Help from another person bathing (including washing, rinsing, drying)?: A Lot Help from another person to put on and taking off regular upper body clothing?: A Little Help from another person to put on and taking off regular lower body clothing?: A Lot 6 Click Score: 14    End of Session        Activity Tolerance Patient limited by lethargy;Patient limited by fatigue   Patient Left in bed;with call bell/phone within reach;with bed alarm set;with family/visitor present   Nurse Communication      Functional  Assessment Tool Used: AM-PAC 6 Clicks Daily Activity;Clinical judgement Functional Limitation: Self care Self Care Current Status (E3662): At least 40  percent but less than 60 percent impaired, limited or restricted Self Care Goal Status (I5038): At least 20 percent but less than 40 percent impaired, limited or restricted   Time: 1130-1200 OT Time Calculation (min): 30 min  Charges: OT G-codes **NOT FOR INPATIENT CLASS** Functional Assessment Tool Used: AM-PAC 6 Clicks Daily Activity;Clinical judgement Functional Limitation: Self care Self Care Current Status (U8280): At least 40 percent but less than 60 percent impaired, limited or restricted Self Care Goal Status (K3491): At least 20 percent but less than 40 percent impaired, limited or restricted OT General Charges $OT Visit: 1 Procedure OT Treatments $Self Care/Home Management : 8-22 mins $Therapeutic Activity: 8-22 mins  Chrys Racer, OTR/L ascom [phone] 06/11/17, 12:09 PM

## 2017-06-11 NOTE — Care Management Important Message (Signed)
Important Message  Patient Details  Name: Ian Townsend MRN: [id number] Date of Birth: Jul 25, 1941   Medicare Important Message Given:  Yes    Shelbie Ammons, RN 06/11/2017, 8:45 AM

## 2017-06-11 NOTE — Clinical Social Work Placement (Signed)
   CLINICAL SOCIAL WORK PLACEMENT  NOTE  Date:  06/11/2017  Patient Details  Name: Ian Townsend MRN: [id number] Date of Birth: 03/31/1941  Clinical Social Work is seeking post-discharge placement for this patient at the [place] level of care (*CSW will initial, date and re-position this form in  chart as items are completed):  Yes   Patient/family provided with [facility]'s list of facilities offering this level of care within the geographic area requested by the patient (or if unable, by the patient's family).  Yes   Patient/family informed of their freedom to choose among providers that offer the needed level of care, that participate in Medicare, Medicaid or managed care program needed by the patient, have an available bed and are willing to accept the patient.      Patient/family informed of [facility]'s ownership interest in [facility] and [facility], as well as of the fact that they are under no obligation to receive care at these facilities.  PASRR submitted to EDS on 06/11/17     PASRR number received on 06/11/17     Existing PASRR number confirmed on       FL2 transmitted to all facilities in geographic area requested by pt/family on 06/11/17     FL2 transmitted to all facilities within larger geographic area on       Patient informed that his/her managed care company has contracts with or will negotiate with certain facilities, including the following:            Patient/family informed of bed offers received.  Patient chooses bed at       Physician recommends and patient chooses bed at      Patient to be transferred to   on  .  Patient to be transferred to facility by       Patient family notified on   of transfer.  Name of family member notified:        PHYSICIAN       Additional Comment:    _______________________________________________ Darden Dates, LCSW 06/11/2017, 2:23 PM

## 2017-06-11 NOTE — Progress Notes (Signed)
Pharmacy Antibiotic Note  Ian Townsend is a 76 y.o. male admitted on 06/08/2017 with aspiration PNA.  Pharmacy consulted for Zosyn dosing.  Plan: Zosyn 3.375 gm IV Q8H EI  Height: 5\' 6"  (167.6 cm) Weight: 140 lb (63.5 kg) IBW/kg (Calculated) : 63.8  Temp (24hrs), Avg:98.5 F (36.9 C), Min:97.7 F (36.5 C), Max:99.4 F (37.4 C)   Recent Labs Lab 06/08/17 2024 06/09/17 0424 06/10/17 0410  WBC 30.0* 29.6* 32.7*  CREATININE 1.61* 1.43* 1.34*    Estimated Creatinine Clearance: 42.1 mL/min (A) (by C-G formula based on SCr of 1.34 mg/dL (H)).    Allergies  Allergen Reactions  . Penicillins Hives         Thank you for allowing pharmacy to be a part of this patient's care.  Shterna Laramee D, Pharm.D., BCPS Clinical Pharmacist 06/11/2017 2:37 PM

## 2017-06-11 NOTE — Progress Notes (Signed)
Physical Therapy Evaluation Patient Details Name: Ian Townsend MRN: [id number] DOB: 08/10/1941 Today's Date: 06/11/2017   History of Present Illness  76 y.o. male with a known history of BPH, Afib, CKD3, GERD, COPD, GERD, HLD, HTN,  PVD, CVA, seizures presents to the emergency department for evaluation of back pain s/p fall.  This is his second fall in two days.  Yesterday he sustained a mechanical fall and hit the back of his head, no LOC. He was seen in the ED and discharged home, returned with a similar fall except that he landed on his bottom after his walker got out from under him, no head trauma, no LOC. Patient's only complaints are headache and slight cough.  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 There is concern for his psychomotor agitation, weakness and gait instability, symptoms that are similar to when he had a previous stroke. Patient's son states that ENT Dr. Tami Ribas has been following the thyroid mass, but risk of surgery outweighs the benefit.  Family was unaware of metastases. Pt is now admitted for sepsis secondary to CAP, R thyroid mass, and recent falls.   Clinical Impression  Pt is able to ambulate farther into the hall today but fatigued quickly and despite having L toe drag issues initially it became more pronounced with increased distance and ultimately he had diminishing safety with increased ambulation.  Pt with L side neglect, but showed good relative strength and ability to perform exercises with constant cuing.      Follow Up Recommendations SNF    Equipment Recommendations       Recommendations for Other Services       Precautions / Restrictions Precautions Precautions: Fall Restrictions Weight Bearing Restrictions: No       Mobility  Bed Mobility Overal bed mobility: Needs Assistance Bed Mobility: Supine to Sit;Sit to Supine     Supine to sit: Min assist Sit to supine: Min assist   General bed mobility comments: Pt showed good effort getting in/out of bed, he did need light phyiscal assist but with multiple cues was able to do most mobility on his own  Transfers Overall transfer level: Needs assistance Equipment used: Rolling walker (2 wheeled) Transfers: Sit to/from Stand Sit to Stand: Min assist         General transfer comment: Pt was able to rise to standing w/o much assist, and despite L sided neglect he was able to rise w/o heavy assist  Ambulation/Gait Ambulation/Gait assistance: Min assist;Mod assist Ambulation Distance (Feet): 70 Feet Assistive device: Rolling walker (2 wheeled)       General Gait Details: Pt with baseline L foot drag/need for reminders to advance it.  He was highly reliant on the walker and seemed to need to increase weight through it with more prolonged ambulation.  Pt's HR did increase to 120s with the effort, pt needed constant verbal and direct cues to keep weight forward on the walker (which kept getting too far in front of him and needed assist to stay close enough to use effectively).   Stairs            Wheelchair Mobility    Modified Rankin (Stroke Patients Only)       Balance Overall balance assessment: Needs assistance Sitting-balance support: No upper extremity supported Sitting balance-Leahy  Scale: Fair     Standing balance support: No upper extremity supported Standing balance-Leahy Scale: Poor Standing balance comment: Pt heavily reliant on the walker                             Pertinent Vitals/Pain Pain Assessment: No/denies pain    Home Living                        Prior Function                 Hand Dominance        Extremity/Trunk Assessment                Communication       Cognition Arousal/Alertness: Lethargic Behavior During Therapy: Restless;Flat affect Overall Cognitive Status:  (h/o cognitive issues, still not back to baseline)                                        General Comments      Exercises General Exercises - Lower Extremity Ankle Circles/Pumps: AROM;10 reps;Both Short Arc Quad: AROM;Strengthening;10 reps;Both Heel Slides: Strengthening;10 reps;Both Hip ABduction/ADduction: Strengthening;10 reps;Both Straight Leg Raises: AROM;10 reps;Both   Assessment/Plan    PT Assessment    PT Problem List         PT Treatment Interventions      PT Goals (Current goals can be found in the Care Plan section)       Frequency Min 2X/week   Barriers to discharge        Co-evaluation               AM-PAC PT "6 Clicks" Daily Activity  Outcome Measure Difficulty turning over in bed (including adjusting bedclothes, sheets and blankets)?: Unable Difficulty moving from lying on back to sitting on the side of the bed? : Unable Difficulty sitting down on and standing up from a chair with arms (e.g., wheelchair, bedside commode, etc,.)?: A Lot Help needed moving to and from a bed to chair (including a wheelchair)?: A Little Help needed walking in hospital room?: A Little Help needed climbing 3-5 steps with a railing? : A Lot 6 Click Score: 12    End of Session Equipment Utilized During Treatment: Gait belt Activity Tolerance: Patient limited by fatigue Patient left: with bed alarm set;with call bell/phone within reach;with family/visitor present   PT Visit Diagnosis: Unsteadiness on feet (R26.81);Repeated falls (R29.6);Muscle weakness (generalized) (M62.81);Difficulty in walking, not elsewhere classified (R26.2)    Time: 5631-4970 PT Time Calculation (min) (ACUTE ONLY): 26 min   Charges:     PT Treatments $Gait Training: 8-22 mins $Therapeutic Exercise: 8-22 mins   PT G Codes:        Kreg Shropshire,  DPT 06/11/2017, 4:29 PM

## 2017-06-11 NOTE — Progress Notes (Signed)
Speech Language Pathology Discharge Patient Details Name: Ian Townsend MRN: [id number] DOB: 1941-01-31 Today's Date: 06/11/2017 Time:  -     Patient discharged from SLP services secondary to remaining at his baseline w/ toleration of current diet as per family and NSG reports. MD to reconsult if any decline in status while admitted.     [name], MS, CCC-SLP Watson,Katherine 06/11/2017, 11:25 AM

## 2017-06-12 LAB — CREATININE, SERUM
CREATININE: 1.71 mg/dL — AB (ref 0.61–1.24)
GFR calc Af Amer: 43 mL/min — ABNORMAL LOW (ref >60)
GFR calc non Af Amer: 37 mL/min — ABNORMAL LOW (ref >60)

## 2017-06-12 LAB — CBC
HCT: 37 % — ABNORMAL LOW (ref 40.0–52.0)
Hemoglobin: 12.7 g/dL — ABNORMAL LOW (ref 13.0–18.0)
MCH: 31.1 pg (ref 26.0–34.0)
MCHC: 34.4 g/dL (ref 32.0–36.0)
MCV: 90.5 fL (ref 80.0–100.0)
PLATELETS: 291 10*3/uL (ref 150–440)
RBC: 4.09 MIL/uL — AB (ref 4.40–5.90)
RDW: 12.9 % (ref 11.5–14.5)
WBC: 35.6 10*3/uL — AB (ref 3.8–10.6)

## 2017-06-12 NOTE — Therapy (Signed)
Pt did not want to wear BIPAP tonight.  The patient's daughter will call if the patient changes his mind.  Patient in no distress.

## 2017-06-12 NOTE — Progress Notes (Signed)
[facility] NAME: Ian Townsend    MR#:  [id number]  DATE OF BIRTH:  Jul 02, 1941  SUBJECTIVE:   Patient has no complaints. Mild cough per his son.  REVIEW OF SYSTEMS:    Review of Systems  Constitutional: Negative for fever, chills weight loss HENT: Negative for ear pain, nosebleeds, congestion, facial swelling, rhinorrhea, neck pain, neck stiffness and ear discharge.   Respiratory: Positive for cough, shortness of breath, NO wheezing  Cardiovascular: Negative  for chest pain, palpitations and leg swelling.  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  Psychiatric/Behavioral: Negative for hallucinations, confusion, dysphoric mood    Tolerating Diet:yes      DRUG ALLERGIES:   Allergies  Allergen Reactions  . Penicillins Hives         VITALS:  Blood pressure (!) 162/87, pulse 88, temperature 98.8 F (37.1 C), temperature source Oral, resp. rate 20, height 5\' 6"  (1.676 m), weight 140 lb (63.5 kg), SpO2 93 %.  PHYSICAL EXAMINATION:  Constitutional: Appears frail NO distress. HENT: Normocephalic. Marland Kitchen Oropharynx is clear and moist.  Eyes: Conjunctivae and EOM are normal. PERRLA, no scleral icterus.  Neck: Normal ROM. Neck supple. No JVD. No tracheal deviation. CVS: RRR, S1/S2 +, no murmurs, no gallops, no carotid bruit.  Pulmonary: Effort normal, He has decreased breath sounds without rhonhii  Abdominal: Soft. BS +,  no distension, tenderness, rebound or guarding.  Musculoskeletal: Normal range of motion. No edema and no tenderness.  Neuro: Alert oriented to name and place not time. CN 2-12 grossly intact.left sided neglect baseline Moves all extremitites Skin: Skin is  warm and dry. No rash noted. Psychiatric: flat affect.   LABORATORY PANEL:   CBC  Recent Labs Lab 06/12/17 0415  WBC 35.6*  HGB 12.7*  HCT 37.0*  PLT 291   ------------------------------------------------------------------------------------------------------------------  Chemistries   Recent Labs Lab 06/10/17 0410 06/12/17 0415  NA 137  --   K 3.9  --   CL 104  --   CO2 25  --   GLUCOSE 172*  --   BUN 16  --   CREATININE 1.34* 1.71*  CALCIUM 10.4*  --    ------------------------------------------------------------------------------------------------------------------  Cardiac Enzymes  Recent Labs Lab 06/08/17 2024  TROPONINI <0.03   ------------------------------------------------------------------------------------------------------------------  RADIOLOGY:  Dg Chest 2 View  Result Date: 06/08/2017 CLINICAL DATA:  Status post fall. History of hypertension, AFib and mitral regurgitation. EXAM: CHEST  2 VIEW COMPARISON:  03/13/2015 FINDINGS: Cardiomediastinal silhouette is normal. Mediastinal contours appear intact. Calcific atherosclerotic disease of the aorta. There is no evidence of pleural effusion or pneumothorax. Bilateral large patchy areas of airspace consolidation versus pulmonary masses. Osseous structures are without acute abnormality. Soft tissues are grossly normal. IMPRESSION: Bilateral large patchy areas of airspace consolidation versus pulmonary masses. Further evaluation with chest CT, preferably with contrast, is recommended. Electronically Signed   By: Fidela Salisbury M.D.   On: 06/08/2017 19:33   Ct Head Wo Contrast  Result Date: 06/08/2017 CLINICAL DATA:  Status post fall with impact to the back of the head. EXAM: CT HEAD WITHOUT CONTRAST CT CERVICAL SPINE WITHOUT CONTRAST TECHNIQUE: Multidetector CT imaging of the head and cervical spine was performed following the standard protocol without intravenous contrast. Multiplanar CT image  reconstructions of the cervical spine were also generated. COMPARISON:  06/07/2017  FINDINGS: CT HEAD FINDINGS Brain: No evidence of acute infarction, hemorrhage, hydrocephalus, extra-axial collection or mass lesion/mass effect. Advanced for age brain parenchymal atrophy and periventricular microangiopathy. Prior right basal ganglia and cerebellar lacunar infarcts. Vascular: Calcific atherosclerotic disease at the skullbase. Skull: Normal. Negative for fracture or focal lesion. Sinuses/Orbits: No acute finding. Other: None. CT CERVICAL SPINE FINDINGS Alignment: Normal. Skull base and vertebrae: No acute fracture. No primary bone lesion or focal pathologic process. Soft tissues and spinal canal: No prevertebral fluid or swelling. No visible canal hematoma. Disc levels:  Multilevel osteoarthritic changes. Upper chest: 1.3 cm soft tissue mass in the right upper lobe. Probable bilateral small pleural effusions. Other: Marked enlargement of the right thyroid gland measuring 5.4 by 5.4 by 8.1 cm. Right anterior chain cervical lymphadenopathy. IMPRESSION: No acute intracranial abnormality. Advanced for age brain parenchymal atrophy and chronic microvascular disease. Prior right basal ganglia and cerebellar lacunar infarcts. No evidence of acute traumatic injury to the cervical spine. Multilevel osteoarthritic changes of the cervical spine. 1.3 cm soft tissue mass in the right upper lobe, suspicious for pulmonary primary or metastatic malignancy. Marked enlargement of the right thyroid gland measuring up to 8.1 cm. Right anterior chain cervical lymphadenopathy. Electronically Signed   By: Fidela Salisbury M.D.   On: 06/08/2017 19:44   Ct Head Wo Contrast  Result Date: 06/07/2017 CLINICAL DATA:  Fall EXAM: CT HEAD WITHOUT CONTRAST TECHNIQUE: Contiguous axial images were obtained from the base of the skull through the vertex without intravenous contrast. COMPARISON:  Head CT 03/16/2017 FINDINGS: Brain: No mass lesion,  intraparenchymal hemorrhage or extra-axial collection. No evidence of acute cortical infarct. There is periventricular hypoattenuation compatible with chronic microvascular disease. Old right basal ganglia lacunar infarct. Age advanced cerebral and cerebellar atrophy with ventriculomegaly. Vascular: Atherosclerotic calcification of the vertebral and internal carotid arteries at the skull base. Skull: Normal visualized skull base, calvarium and extracranial soft tissues. Sinuses/Orbits: No sinus fluid levels or advanced mucosal thickening. No mastoid effusion. Normal orbits. IMPRESSION: 1. No acute intracranial abnormality. 2. Age advanced atrophy, chronic hypertensive microangiopathy and old right basal ganglia lacunar infarct. Electronically Signed   By: Ulyses Jarred M.D.   On: 06/07/2017 18:38   Ct Chest W Contrast  Result Date: 06/08/2017 CLINICAL DATA:  Cough, unwitnessed fall. EXAM: CT CHEST WITH CONTRAST TECHNIQUE: Multidetector CT imaging of the chest was performed during intravenous contrast administration. CONTRAST:  45mL ISOVUE-300 IOPAMIDOL (ISOVUE-300) INJECTION 61% COMPARISON:  Radiographs of same day.  CT scan of September 22, 2012. FINDINGS: Cardiovascular: Atherosclerosis of thoracic aorta is noted without aneurysm or dissection. Coronary artery calcifications are noted suggesting coronary artery disease. No pericardial effusion is noted. Mediastinum/Nodes: Large right hypodense thyroid mass is noted which is significantly increased in size compared to prior exam, concerning for malignancy. This results in significant displacement of the trachea to the left. 3.2 cm left hilar lymph node is noted. Moderate size hiatal hernia is noted. Lungs/Pleura: Mild bilateral pleural effusions are noted. Multiple pulmonary masses are noted bilaterally consistent with metastatic disease. The largest on the left measures 7.3 cm posteriorly in the left lower lobe. The largest on the right measures 8.4 cm  anteriorly in the right middle lobe. No pneumothorax is noted. Upper Abdomen: No acute abnormality. Musculoskeletal: No chest wall abnormality. No acute or significant osseous findings. IMPRESSION: Large right hypodense thyroid mass is noted which is significantly increased in size compared to prior exam concerning for malignancy. This results in significant displacement of the trachea to the  left. 3.2 cm left hilar lymph node is noted concerning for metastatic disease. Also noted are multiple pulmonary nodules in both lungs with the largest measuring approximately 8 cm in the right middle lobe. These are concerning for metastatic disease is well. Coronary artery calcifications are noted suggesting coronary artery disease. Mild bilateral pleural effusions are noted. Moderate size hiatal hernia is noted. Aortic Atherosclerosis (ICD10-I70.0). Electronically Signed   By: Marijo Conception, M.D.   On: 06/08/2017 22:01   Ct Cervical Spine Wo Contrast  Result Date: 06/08/2017 CLINICAL DATA:  Status post fall with impact to the back of the head. EXAM: CT HEAD WITHOUT CONTRAST CT CERVICAL SPINE WITHOUT CONTRAST TECHNIQUE: Multidetector CT imaging of the head and cervical spine was performed following the standard protocol without intravenous contrast. Multiplanar CT image reconstructions of the cervical spine were also generated. COMPARISON:  06/07/2017 FINDINGS: CT HEAD FINDINGS Brain: No evidence of acute infarction, hemorrhage, hydrocephalus, extra-axial collection or mass lesion/mass effect. Advanced for age brain parenchymal atrophy and periventricular microangiopathy. Prior right basal ganglia and cerebellar lacunar infarcts. Vascular: Calcific atherosclerotic disease at the skullbase. Skull: Normal. Negative for fracture or focal lesion. Sinuses/Orbits: No acute finding. Other: None. CT CERVICAL SPINE FINDINGS Alignment: Normal. Skull base and vertebrae: No acute fracture. No primary bone lesion or focal pathologic  process. Soft tissues and spinal canal: No prevertebral fluid or swelling. No visible canal hematoma. Disc levels:  Multilevel osteoarthritic changes. Upper chest: 1.3 cm soft tissue mass in the right upper lobe. Probable bilateral small pleural effusions. Other: Marked enlargement of the right thyroid gland measuring 5.4 by 5.4 by 8.1 cm. Right anterior chain cervical lymphadenopathy. IMPRESSION: No acute intracranial abnormality. Advanced for age brain parenchymal atrophy and chronic microvascular disease. Prior right basal ganglia and cerebellar lacunar infarcts. No evidence of acute traumatic injury to the cervical spine. Multilevel osteoarthritic changes of the cervical spine. 1.3 cm soft tissue mass in the right upper lobe, suspicious for pulmonary primary or metastatic malignancy. Marked enlargement of the right thyroid gland measuring up to 8.1 cm. Right anterior chain cervical lymphadenopathy. Electronically Signed   By: Fidela Salisbury M.D.   On: 06/08/2017 19:44   Mr Brain Wo Contrast  Result Date: 06/09/2017 CLINICAL DATA:  Concern for stroke.  Encephalopathy. EXAM: MRI HEAD WITHOUT CONTRAST MRA HEAD WITHOUT CONTRAST TECHNIQUE: Multiplanar, multiecho pulse sequences of the brain and surrounding structures were obtained without intravenous contrast. Angiographic images of the head were obtained using MRA technique without contrast. COMPARISON:  Head CT 06/08/2017 Brain MRI 02/09/2012 and 10/27/2010 Carotid ultrasound 10/27/2010 CT neck 12/05/2012 FINDINGS: The study is severely degraded by motion, despite efforts to reduce this artifact, including utilization of motion-resistant MR sequences. The findings of the study are interpreted in the context of reduced sensitivity/specificity. MRI HEAD FINDINGS Brain: The midline structures are normal. No focal diffusion restriction to indicate acute infarct. No intraparenchymal hemorrhage. There is severe atrophy. There are multiple old cerebellar and  supratentorial infarcts. No midline shift or mass lesion. There is Wallerian degeneration of the right cerebral peduncle. No chronic microhemorrhage or cerebral amyloid angiopathy. No hydrocephalus, age advanced atrophy or lobar predominant volume loss. No dural abnormality or extra-axial collection. Skull and upper cervical spine: The visualized skull base, calvarium, upper cervical spine and extracranial soft tissues are normal. Sinuses/Orbits: No fluid levels or advanced mucosal thickening. No mastoid effusion. Normal orbits. MRA HEAD FINDINGS Intracranial internal carotid arteries: There is no flow related enhancement seen within the right internal carotid artery. On  prior brain MRI studies, the right ICA flow void was abnormal and is suspected to be chronic, particularly given the severe volume loss of the right MCA territory. The left ICA is patent. Anterior cerebral arteries: Normal bilateral a 2 segments. Minimal enhancement of right A1 segment. Middle cerebral arteries: Normal left MCA. Minimal to no flow related enhancement of the right MCA. Posterior communicating arteries: Present bilaterally. Posterior cerebral arteries: Limited visualization due to motion, but patent proximally. Poor visualization of the posterior circulation below the level of the posterior cerebral arteries. IMPRESSION: 1. Severely motion degraded examination. Within that limitation, there is no acute ischemia seen on diffusion-weighted imaging. 2. Severely motion degraded intracranial MRA. Occlusion of the right internal carotid artery with little to no flow related enhancement seen in the right middle cerebral artery. This is a chronic occlusion, as demonstrated on prior MRI and CT studies. 3. Minimal appreciable flow related enhancement within the basilar artery, with visualization severely limited by a motion. The basilar artery with severely diminutive on the CT neck performed 12/05/2012. With this degree of motion, it is not  possible to determine whether the basilar artery is patent. 4. 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 Electronically Signed   By: Ulyses Jarred M.D.   On: 06/09/2017 06:58   US Carotid Bilateral (at [facility] Only)  Result Date: 06/09/2017 CLINICAL DATA:  Altered mental status. EXAM: BILATERAL CAROTID DUPLEX ULTRASOUND TECHNIQUE: Vitug scale imaging, color Doppler and duplex ultrasound were performed of bilateral carotid and vertebral arteries in the neck. COMPARISON:  MRI 06/09/2017. Head CT 06/08/2017. Neck CT 11/25/2012 . Thyroid ultrasound 04/14/2013 FINDINGS: Criteria: Quantification of carotid stenosis is based on velocity parameters that correlate the residual internal carotid diameter with NASCET-based stenosis levels, using the diameter of the distal internal carotid lumen as the denominator for stenosis measurement. The following velocity measurements were obtained: RIGHT ICA:  Complete occlusion. CCA:  120/14 cm/sec ECA:  136 cm/sec LEFT ICA:  173/35 cm/sec CCA:  831/51 cm/sec SYSTOLIC ICA/CCA RATIO:  1.5 DIASTOLIC ICA/CCA RATIO:  2.4 ECA:  98 cm/sec RIGHT CAROTID ARTERY: Diffuse mild right common carotid and carotid bifurcation disease noted. Complete occlusion of the right internal carotid artery appears to be present. RIGHT VERTEBRAL ARTERY:  Patent with antegrade flow. LEFT CAROTID ARTERY: Moderate left common carotid and carotid bifurcation atherosclerotic vascular plaque. LEFT VERTEBRAL ARTERY:  Patent with antegrade flow. Complex cystic mass incidentally noted in the right thyroid gland. Patient had a thyroid ultrasound on 04/14/2013 was abnormal. Repeat thyroid ultrasound should be considered for continued evaluation. IMPRESSION: 1.  Complete occlusion of the right ICA. 2. Moderate left carotid bifurcation atherosclerotic vascular disease with degree of stenosis 50- 69%. Given the probable complete occlusion of the right ICA and severity of the disease at the  left carotid bifurcation, patient may benefit from carotid CTA. 3. Vertebrals are patent with antegrade flow. 4. Complex cystic mass right thyroid gland. Patient had a abnormal thyroid ultrasound 04/14/2013. Repeat thyroid ultrasound should be considered for continued evaluation. Electronically Signed   By: Marcello Moores  Register   On: 06/09/2017 11:04   Mr Jodene Nam Head Wo Contrast  Result Date: 06/09/2017 CLINICAL DATA:  Concern for stroke.  Encephalopathy. EXAM: MRI HEAD WITHOUT CONTRAST MRA HEAD WITHOUT CONTRAST TECHNIQUE: Multiplanar, multiecho pulse sequences of the brain and surrounding structures were obtained without intravenous contrast. Angiographic images of the head were obtained using MRA technique without contrast. COMPARISON:  Head CT 06/08/2017 Brain MRI 02/09/2012 and 10/27/2010  Carotid ultrasound 10/27/2010 CT neck 12/05/2012 FINDINGS: The study is severely degraded by motion, despite efforts to reduce this artifact, including utilization of motion-resistant MR sequences. The findings of the study are interpreted in the context of reduced sensitivity/specificity. MRI HEAD FINDINGS Brain: The midline structures are normal. No focal diffusion restriction to indicate acute infarct. No intraparenchymal hemorrhage. There is severe atrophy. There are multiple old cerebellar and supratentorial infarcts. No midline shift or mass lesion. There is Wallerian degeneration of the right cerebral peduncle. No chronic microhemorrhage or cerebral amyloid angiopathy. No hydrocephalus, age advanced atrophy or lobar predominant volume loss. No dural abnormality or extra-axial collection. Skull and upper cervical spine: The visualized skull base, calvarium, upper cervical spine and extracranial soft tissues are normal. Sinuses/Orbits: No fluid levels or advanced mucosal thickening. No mastoid effusion. Normal orbits. MRA HEAD FINDINGS Intracranial internal carotid arteries: There is no flow related enhancement seen within  the right internal carotid artery. On prior brain MRI studies, the right ICA flow void was abnormal and is suspected to be chronic, particularly given the severe volume loss of the right MCA territory. The left ICA is patent. Anterior cerebral arteries: Normal bilateral a 2 segments. Minimal enhancement of right A1 segment. Middle cerebral arteries: Normal left MCA. Minimal to no flow related enhancement of the right MCA. Posterior communicating arteries: Present bilaterally. Posterior cerebral arteries: Limited visualization due to motion, but patent proximally. Poor visualization of the posterior circulation below the level of the posterior cerebral arteries. IMPRESSION: 1. Severely motion degraded examination. Within that limitation, there is no acute ischemia seen on diffusion-weighted imaging. 2. Severely motion degraded intracranial MRA. Occlusion of the right internal carotid artery with little to no flow related enhancement seen in the right middle cerebral artery. This is a chronic occlusion, as demonstrated on prior MRI and CT studies. 3. Minimal appreciable flow related enhancement within the basilar artery, with visualization severely limited by a motion. The basilar artery with severely diminutive on the CT neck performed 12/05/2012. With this degree of motion, it is not possible to determine whether the basilar artery is patent. 4. 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 Electronically Signed   By: Ulyses Jarred M.D.   On: 06/09/2017 06:58     ASSESSMENT AND PLAN:   76 y.o. male with a known history of BPH, Afib, CKD3, GERD, COPD, GERD, HLD, HTN,  PVD, CVA, seizures presents to the emergency department for evaluation of back pain s/p fall and found to have on chest x-ray bilateral large patchy areas of airspace disease.   1.  sepsis with leukocytosis and tachypnea due to Community-acquired pneumonia: Continue Levaquin and Zosyn, follow up on final blood  cultures Dr. Benjie Karvonen added Zosyn to cover for postobstructive/aspiration pneumonia. Worsening leukocytosis at 35,600. Follow-up CBC.   2.  Large right hypodense thyroid mass is noted which is significantly increased in size compared to prior exam concerning for malignancy. This results in significant displacement of the trachea to the left. 3.2 cm left hilar lymph node is noted concerning for metastatic disease. Also noted are multiple pulmonary nodules in both lungs with the largest measuring approximately 8 cm in the right middle lobe. These are concerning for metastatic disease as well  This is been followed by ENT however metastatic disease was unknown. Patient was evaluated by palliative care and oncology. Family does not want more aggressive care.   3. Recent falls and back pain: Neurological workup including MRI/MRA of the brain shows no acute pathology.  He has known occlusion of the right carotid artery.  lumbar x-ray shows no acute fracture  4. History of CVA with left-sided neglect: Continue aspirin and statin  5. BPH: Continue finasteride and tamsulosin  6. Chronic kidney disease stage III: Creatinine at baseline  7. PAF: Continue diltiazem for heart rate control  8. Essential hypertension: Continue lisinopril and diltiazem  9. History of seizures: Continue Dilantin  10. COPD without exacerbation: Continue inhalers  Management plans discussed with the patient's daughter and the son, they are in agreement.  CODE STATUS: FULL  TOTAL TIME TAKING CARE OF THIS PATIENT: 28 minutes.    discussed with palliative care   Patient may be able to be discharged to skilled nursing facility with palliative care following his outpatient with possible hospice in the future.  POSSIBLE D/C 2 days, DEPENDING ON CLINICAL CONDITION.   Demetrios Loll M.D on 06/12/2017 at 12:56 PM  Between 7am to 6pm - Pager - [phone] After 6pm go to [email or web address] - password [email or web address]  Office  [phone]  CC: Primary care physician; [place] Pink, MD  Note: This dictation was prepared with Dragon dictation along with smaller phrase technology. Any transcriptional errors that result from this process are unintentional.

## 2017-06-12 NOTE — Clinical Social Work Note (Signed)
Clinical Social Work Assessment  Patient Details  Name: Ian Townsend MRN: [id number] Date of Birth: Dec 26, 1940  Date of referral:  06/11/17               Reason for consult:  Facility Placement                Permission sought to share information with:  Family Supports Permission granted to share information::  Yes, Verbal Permission Granted  Name::        Agency::     Relationship::     Contact Information:     Housing/Transportation Living arrangements for the past 2 months:  [place] of Information:  Medical Team, Adult Children Patient Interpreter Needed:  None Criminal Activity/Legal Involvement Pertinent to Current Situation/Hospitalization:  No - Comment as needed Significant Relationships:  Adult Children, Community Support Lives with:  Facility Resident Do you feel safe going back to the place where you live?  Yes Need for family participation in patient care:  Yes (Comment) (Patient has significant confusion)  Care giving concerns:  PT recommendation for SNF/Patient shows signs of failure to thrive   Social Worker assessment / plan:  The CSW met with the patient and his son and daughter at bedside to discuss discharge planning. The family is willing to pursue SNF with palliative to follow at [facility]; however, the family has concerns that the patient has not been eating and his Marriah Sanderlin count has risen even with IV antibiotics. They also feel that the patient is showing more confusion than baseline such as calling them by incorrect names and names that are not those of family members. 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   The CSW has sent the referral and selected [facility] for the patient. The family is also amenable to the idea of hospice home placement  should the patient be appropriate for comfort care with regards to failure to thrive and infection. The family wishes to keep the patient comfortable and pursue the most dignified course of action.  Employment status:  Retired Forensic scientist:  [employer] PT Recommendations:  Center / Referral to community resources:  [place]  Patient/Family's Response to care:  The family thanked the CSW for frank discussion of end of life goals.  Patient/Family's Understanding of and Emotional Response to Diagnosis, Current Treatment, and Prognosis:  The family understands that the patient is nearing end-of-life.  Emotional Assessment Appearance:  Appears older than stated age Attitude/Demeanor/Rapport:  Lethargic, Irrational Affect (typically observed):  Flat Orientation:  Oriented to Self Alcohol / Substance use:  Never Used Psych involvement (Current and /or in the community):  No (Comment)  Discharge Needs  Concerns to be addressed:  Care Coordination, Grief and Loss Concerns, Discharge Planning Concerns Readmission within the last 30 days:  No Current discharge risk:  Chronically ill, Terminally ill Barriers to Discharge:  Continued Medical Work up   [employer], LCSW 06/12/2017, 4:44 PM

## 2017-06-13 LAB — CULTURE, BLOOD (ROUTINE X 2)
CULTURE: NO GROWTH
Culture: NO GROWTH
SPECIAL REQUESTS: ADEQUATE
Special Requests: ADEQUATE

## 2017-06-13 NOTE — Plan of Care (Signed)
Problem: Safety: Goal: Ability to remain free from injury will improve Outcome: Progressing Pt is high fall risk. Pt has yellow arm band and yellow socks on. Bed alarm is on. Seizure pads present on bed.   Problem: Fluid Volume: Goal: Ability to maintain a balanced intake and output will improve Outcome: Not Progressing Daughter reports pt only took about 15 bites today with sips of fluid. Pt states that he just doesn't have an appetite.   Problem: Nutrition: Goal: Adequate nutrition will be maintained Outcome: Not Progressing Daughter reports pt only took about 15 bites today with sips of fluid. Pt states that he just doesn't have an appetite.

## 2017-06-13 NOTE — Progress Notes (Signed)
[place] [facility] [place] NAME: Ian Townsend    MR#:  [id number]  DATE OF BIRTH:  December 02, 1940  SUBJECTIVE:   Patient has no complaints. Mild cough per his son. Still poor oral intake.  REVIEW OF SYSTEMS:    Review of Systems  Constitutional: Negative for fever, chills weight loss HENT: Negative for ear pain, nosebleeds, congestion, facial swelling, rhinorrhea, neck pain, neck stiffness and ear discharge.   Respiratory: Positive for cough, shortness of breath, NO wheezing  Cardiovascular: Negative  for chest pain, palpitations and leg swelling.  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  Psychiatric/Behavioral: Negative for hallucinations, confusion, dysphoric mood    Tolerating Diet:yes      DRUG ALLERGIES:   Allergies  Allergen Reactions  . Penicillins Hives         VITALS:  Blood pressure (!) 176/72, pulse 99, temperature 98.1 F (36.7 C), resp. rate 16, height 5\' 6"  (1.676 m), weight 140 lb (63.5 kg), SpO2 91 %.  PHYSICAL EXAMINATION:  Constitutional: Appears frail NO distress. HENT: Normocephalic. Marland Kitchen Oropharynx is clear and moist.  Eyes: Conjunctivae and EOM are normal. PERRLA, no scleral icterus.  Neck: Normal ROM. Neck supple. No JVD. No tracheal deviation. CVS: RRR, S1/S2 +, no murmurs, no gallops, no carotid bruit.  Pulmonary: Effort normal, He has decreased breath sounds without rhonhii  Abdominal: Soft. BS +,  no distension, tenderness, rebound or guarding.  Musculoskeletal: Normal range of motion. No edema and no tenderness.  Neuro: Alert oriented to name and place not time. CN 2-12 grossly intact.left sided neglect baseline Moves all extremitites Skin: Skin is  warm and dry. No rash noted. Psychiatric: flat affect.   LABORATORY PANEL:   CBC  Recent Labs Lab 06/12/17 0415  WBC 35.6*  HGB 12.7*  HCT 37.0*  PLT 291   ------------------------------------------------------------------------------------------------------------------  Chemistries   Recent Labs Lab 06/10/17 0410 06/12/17 0415  NA 137  --   K 3.9  --   CL 104  --   CO2 25  --   GLUCOSE 172*  --   BUN 16  --   CREATININE 1.34* 1.71*  CALCIUM 10.4*  --    ------------------------------------------------------------------------------------------------------------------  Cardiac Enzymes  Recent Labs Lab 06/08/17 2024  TROPONINI <0.03   ------------------------------------------------------------------------------------------------------------------  RADIOLOGY:  Dg Chest 2 View  Result Date: 06/08/2017 CLINICAL DATA:  Status post fall. History of hypertension, AFib and mitral regurgitation. EXAM: CHEST  2 VIEW COMPARISON:  03/13/2015 FINDINGS: Cardiomediastinal silhouette is normal. Mediastinal contours appear intact. Calcific atherosclerotic disease of the aorta. There is no evidence of pleural effusion or pneumothorax. Bilateral large patchy areas of airspace consolidation versus pulmonary masses. Osseous structures are without acute abnormality. Soft tissues are grossly normal. IMPRESSION: Bilateral large patchy areas of airspace consolidation versus pulmonary masses. Further evaluation with chest CT, preferably with contrast, is recommended. Electronically Signed   By: Fidela Salisbury M.D.   On: 06/08/2017 19:33   Ct Head Wo Contrast  Result Date: 06/08/2017 CLINICAL DATA:  Status post fall with impact to the back of the head. EXAM: CT HEAD WITHOUT CONTRAST CT CERVICAL SPINE WITHOUT CONTRAST TECHNIQUE: Multidetector CT imaging of the head and cervical spine was performed following the standard protocol without intravenous contrast. Multiplanar CT image  reconstructions of the cervical spine were also generated. COMPARISON:  06/07/2017 FINDINGS: CT HEAD FINDINGS Brain: No evidence of acute infarction, hemorrhage, hydrocephalus, extra-axial collection or mass lesion/mass effect. Advanced for age brain parenchymal atrophy and periventricular microangiopathy. Prior right basal ganglia and cerebellar lacunar infarcts. Vascular: Calcific atherosclerotic disease at the skullbase. Skull: Normal. Negative for fracture or focal lesion. Sinuses/Orbits: No acute finding. Other: None. CT CERVICAL SPINE FINDINGS Alignment: Normal. Skull base and vertebrae: No acute fracture. No primary bone lesion or focal pathologic process. Soft tissues and spinal canal: No prevertebral fluid or swelling. No visible canal hematoma. Disc levels:  Multilevel osteoarthritic changes. Upper chest: 1.3 cm soft tissue mass in the right upper lobe. Probable bilateral small pleural effusions. Other: Marked enlargement of the right thyroid gland measuring 5.4 by 5.4 by 8.1 cm. Right anterior chain cervical lymphadenopathy. IMPRESSION: No acute intracranial abnormality. Advanced for age brain parenchymal atrophy and chronic microvascular disease. Prior right basal ganglia and cerebellar lacunar infarcts. No evidence of acute traumatic injury to the cervical spine. Multilevel osteoarthritic changes of the cervical spine. 1.3 cm soft tissue mass in the right upper lobe, suspicious for pulmonary primary or metastatic malignancy. Marked enlargement of the right thyroid gland measuring up to 8.1 cm. Right anterior chain cervical lymphadenopathy. Electronically Signed   By: Fidela Salisbury M.D.   On: 06/08/2017 19:44   Ct Head Wo Contrast  Result Date: 06/07/2017 CLINICAL DATA:  Fall EXAM: CT HEAD WITHOUT CONTRAST TECHNIQUE: Contiguous axial images were obtained from the base of the skull through the vertex without intravenous contrast. COMPARISON:  Head CT 03/16/2017 FINDINGS: Brain: No mass lesion,  intraparenchymal hemorrhage or extra-axial collection. No evidence of acute cortical infarct. There is periventricular hypoattenuation compatible with chronic microvascular disease. Old right basal ganglia lacunar infarct. Age advanced cerebral and cerebellar atrophy with ventriculomegaly. Vascular: Atherosclerotic calcification of the vertebral and internal carotid arteries at the skull base. Skull: Normal visualized skull base, calvarium and extracranial soft tissues. Sinuses/Orbits: No sinus fluid levels or advanced mucosal thickening. No mastoid effusion. Normal orbits. IMPRESSION: 1. No acute intracranial abnormality. 2. Age advanced atrophy, chronic hypertensive microangiopathy and old right basal ganglia lacunar infarct. Electronically Signed   By: Ulyses Jarred M.D.   On: 06/07/2017 18:38   Ct Chest W Contrast  Result Date: 06/08/2017 CLINICAL DATA:  Cough, unwitnessed fall. EXAM: CT CHEST WITH CONTRAST TECHNIQUE: Multidetector CT imaging of the chest was performed during intravenous contrast administration. CONTRAST:  71mL ISOVUE-300 IOPAMIDOL (ISOVUE-300) INJECTION 61% COMPARISON:  Radiographs of same day.  CT scan of September 22, 2012. FINDINGS: Cardiovascular: Atherosclerosis of thoracic aorta is noted without aneurysm or dissection. Coronary artery calcifications are noted suggesting coronary artery disease. No pericardial effusion is noted. Mediastinum/Nodes: Large right hypodense thyroid mass is noted which is significantly increased in size compared to prior exam, concerning for malignancy. This results in significant displacement of the trachea to the left. 3.2 cm left hilar lymph node is noted. Moderate size hiatal hernia is noted. Lungs/Pleura: Mild bilateral pleural effusions are noted. Multiple pulmonary masses are noted bilaterally consistent with metastatic disease. The largest on the left measures 7.3 cm posteriorly in the left lower lobe. The largest on the right measures 8.4 cm  anteriorly in the right middle lobe. No pneumothorax is noted. Upper Abdomen: No acute abnormality. Musculoskeletal: No chest wall abnormality. No acute or significant osseous findings. IMPRESSION: Large right hypodense thyroid mass is noted which is significantly increased in size compared to prior exam concerning for malignancy. This results in significant displacement of the trachea to  the left. 3.2 cm left hilar lymph node is noted concerning for metastatic disease. Also noted are multiple pulmonary nodules in both lungs with the largest measuring approximately 8 cm in the right middle lobe. These are concerning for metastatic disease is well. Coronary artery calcifications are noted suggesting coronary artery disease. Mild bilateral pleural effusions are noted. Moderate size hiatal hernia is noted. Aortic Atherosclerosis (ICD10-I70.0). Electronically Signed   By: Marijo Conception, M.D.   On: 06/08/2017 22:01   Ct Cervical Spine Wo Contrast  Result Date: 06/08/2017 CLINICAL DATA:  Status post fall with impact to the back of the head. EXAM: CT HEAD WITHOUT CONTRAST CT CERVICAL SPINE WITHOUT CONTRAST TECHNIQUE: Multidetector CT imaging of the head and cervical spine was performed following the standard protocol without intravenous contrast. Multiplanar CT image reconstructions of the cervical spine were also generated. COMPARISON:  06/07/2017 FINDINGS: CT HEAD FINDINGS Brain: No evidence of acute infarction, hemorrhage, hydrocephalus, extra-axial collection or mass lesion/mass effect. Advanced for age brain parenchymal atrophy and periventricular microangiopathy. Prior right basal ganglia and cerebellar lacunar infarcts. Vascular: Calcific atherosclerotic disease at the skullbase. Skull: Normal. Negative for fracture or focal lesion. Sinuses/Orbits: No acute finding. Other: None. CT CERVICAL SPINE FINDINGS Alignment: Normal. Skull base and vertebrae: No acute fracture. No primary bone lesion or focal pathologic  process. Soft tissues and spinal canal: No prevertebral fluid or swelling. No visible canal hematoma. Disc levels:  Multilevel osteoarthritic changes. Upper chest: 1.3 cm soft tissue mass in the right upper lobe. Probable bilateral small pleural effusions. Other: Marked enlargement of the right thyroid gland measuring 5.4 by 5.4 by 8.1 cm. Right anterior chain cervical lymphadenopathy. IMPRESSION: No acute intracranial abnormality. Advanced for age brain parenchymal atrophy and chronic microvascular disease. Prior right basal ganglia and cerebellar lacunar infarcts. No evidence of acute traumatic injury to the cervical spine. Multilevel osteoarthritic changes of the cervical spine. 1.3 cm soft tissue mass in the right upper lobe, suspicious for pulmonary primary or metastatic malignancy. Marked enlargement of the right thyroid gland measuring up to 8.1 cm. Right anterior chain cervical lymphadenopathy. Electronically Signed   By: Fidela Salisbury M.D.   On: 06/08/2017 19:44   Mr Brain Wo Contrast  Result Date: 06/09/2017 CLINICAL DATA:  Concern for stroke.  Encephalopathy. EXAM: MRI HEAD WITHOUT CONTRAST MRA HEAD WITHOUT CONTRAST TECHNIQUE: Multiplanar, multiecho pulse sequences of the brain and surrounding structures were obtained without intravenous contrast. Angiographic images of the head were obtained using MRA technique without contrast. COMPARISON:  Head CT 06/08/2017 Brain MRI 02/09/2012 and 10/27/2010 Carotid ultrasound 10/27/2010 CT neck 12/05/2012 FINDINGS: The study is severely degraded by motion, despite efforts to reduce this artifact, including utilization of motion-resistant MR sequences. The findings of the study are interpreted in the context of reduced sensitivity/specificity. MRI HEAD FINDINGS Brain: The midline structures are normal. No focal diffusion restriction to indicate acute infarct. No intraparenchymal hemorrhage. There is severe atrophy. There are multiple old cerebellar and  supratentorial infarcts. No midline shift or mass lesion. There is Wallerian degeneration of the right cerebral peduncle. No chronic microhemorrhage or cerebral amyloid angiopathy. No hydrocephalus, age advanced atrophy or lobar predominant volume loss. No dural abnormality or extra-axial collection. Skull and upper cervical spine: The visualized skull base, calvarium, upper cervical spine and extracranial soft tissues are normal. Sinuses/Orbits: No fluid levels or advanced mucosal thickening. No mastoid effusion. Normal orbits. MRA HEAD FINDINGS Intracranial internal carotid arteries: There is no flow related enhancement seen within the right internal carotid artery.  On prior brain MRI studies, the right ICA flow void was abnormal and is suspected to be chronic, particularly given the severe volume loss of the right MCA territory. The left ICA is patent. Anterior cerebral arteries: Normal bilateral a 2 segments. Minimal enhancement of right A1 segment. Middle cerebral arteries: Normal left MCA. Minimal to no flow related enhancement of the right MCA. Posterior communicating arteries: Present bilaterally. Posterior cerebral arteries: Limited visualization due to motion, but patent proximally. Poor visualization of the posterior circulation below the level of the posterior cerebral arteries. IMPRESSION: 1. Severely motion degraded examination. Within that limitation, there is no acute ischemia seen on diffusion-weighted imaging. 2. Severely motion degraded intracranial MRA. Occlusion of the right internal carotid artery with little to no flow related enhancement seen in the right middle cerebral artery. This is a chronic occlusion, as demonstrated on prior MRI and CT studies. 3. Minimal appreciable flow related enhancement within the basilar artery, with visualization severely limited by a motion. The basilar artery with severely diminutive on the CT neck performed 12/05/2012. With this degree of motion, it is not  possible to determine whether the basilar artery is patent. 4. 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 Electronically Signed   By: Ulyses Jarred M.D.   On: 06/09/2017 06:58   US Carotid Bilateral (at [facility] Only)  Result Date: 06/09/2017 CLINICAL DATA:  Altered mental status. EXAM: BILATERAL CAROTID DUPLEX ULTRASOUND TECHNIQUE: Antillon scale imaging, color Doppler and duplex ultrasound were performed of bilateral carotid and vertebral arteries in the neck. COMPARISON:  MRI 06/09/2017. Head CT 06/08/2017. Neck CT 11/25/2012 . Thyroid ultrasound 04/14/2013 FINDINGS: Criteria: Quantification of carotid stenosis is based on velocity parameters that correlate the residual internal carotid diameter with NASCET-based stenosis levels, using the diameter of the distal internal carotid lumen as the denominator for stenosis measurement. The following velocity measurements were obtained: RIGHT ICA:  Complete occlusion. CCA:  120/14 cm/sec ECA:  136 cm/sec LEFT ICA:  173/35 cm/sec CCA:  737/10 cm/sec SYSTOLIC ICA/CCA RATIO:  1.5 DIASTOLIC ICA/CCA RATIO:  2.4 ECA:  98 cm/sec RIGHT CAROTID ARTERY: Diffuse mild right common carotid and carotid bifurcation disease noted. Complete occlusion of the right internal carotid artery appears to be present. RIGHT VERTEBRAL ARTERY:  Patent with antegrade flow. LEFT CAROTID ARTERY: Moderate left common carotid and carotid bifurcation atherosclerotic vascular plaque. LEFT VERTEBRAL ARTERY:  Patent with antegrade flow. Complex cystic mass incidentally noted in the right thyroid gland. Patient had a thyroid ultrasound on 04/14/2013 was abnormal. Repeat thyroid ultrasound should be considered for continued evaluation. IMPRESSION: 1.  Complete occlusion of the right ICA. 2. Moderate left carotid bifurcation atherosclerotic vascular disease with degree of stenosis 50- 69%. Given the probable complete occlusion of the right ICA and severity of the disease at the  left carotid bifurcation, patient may benefit from carotid CTA. 3. Vertebrals are patent with antegrade flow. 4. Complex cystic mass right thyroid gland. Patient had a abnormal thyroid ultrasound 04/14/2013. Repeat thyroid ultrasound should be considered for continued evaluation. Electronically Signed   By: Marcello Moores  Register   On: 06/09/2017 11:04   Mr Jodene Nam Head Wo Contrast  Result Date: 06/09/2017 CLINICAL DATA:  Concern for stroke.  Encephalopathy. EXAM: MRI HEAD WITHOUT CONTRAST MRA HEAD WITHOUT CONTRAST TECHNIQUE: Multiplanar, multiecho pulse sequences of the brain and surrounding structures were obtained without intravenous contrast. Angiographic images of the head were obtained using MRA technique without contrast. COMPARISON:  Head CT 06/08/2017 Brain MRI 02/09/2012 and  10/27/2010 Carotid ultrasound 10/27/2010 CT neck 12/05/2012 FINDINGS: The study is severely degraded by motion, despite efforts to reduce this artifact, including utilization of motion-resistant MR sequences. The findings of the study are interpreted in the context of reduced sensitivity/specificity. MRI HEAD FINDINGS Brain: The midline structures are normal. No focal diffusion restriction to indicate acute infarct. No intraparenchymal hemorrhage. There is severe atrophy. There are multiple old cerebellar and supratentorial infarcts. No midline shift or mass lesion. There is Wallerian degeneration of the right cerebral peduncle. No chronic microhemorrhage or cerebral amyloid angiopathy. No hydrocephalus, age advanced atrophy or lobar predominant volume loss. No dural abnormality or extra-axial collection. Skull and upper cervical spine: The visualized skull base, calvarium, upper cervical spine and extracranial soft tissues are normal. Sinuses/Orbits: No fluid levels or advanced mucosal thickening. No mastoid effusion. Normal orbits. MRA HEAD FINDINGS Intracranial internal carotid arteries: There is no flow related enhancement seen within  the right internal carotid artery. On prior brain MRI studies, the right ICA flow void was abnormal and is suspected to be chronic, particularly given the severe volume loss of the right MCA territory. The left ICA is patent. Anterior cerebral arteries: Normal bilateral a 2 segments. Minimal enhancement of right A1 segment. Middle cerebral arteries: Normal left MCA. Minimal to no flow related enhancement of the right MCA. Posterior communicating arteries: Present bilaterally. Posterior cerebral arteries: Limited visualization due to motion, but patent proximally. Poor visualization of the posterior circulation below the level of the posterior cerebral arteries. IMPRESSION: 1. Severely motion degraded examination. Within that limitation, there is no acute ischemia seen on diffusion-weighted imaging. 2. Severely motion degraded intracranial MRA. Occlusion of the right internal carotid artery with little to no flow related enhancement seen in the right middle cerebral artery. This is a chronic occlusion, as demonstrated on prior MRI and CT studies. 3. Minimal appreciable flow related enhancement within the basilar artery, with visualization severely limited by a motion. The basilar artery with severely diminutive on the CT neck performed 12/05/2012. With this degree of motion, it is not possible to determine whether the basilar artery is patent. 4. 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 Electronically Signed   By: Ulyses Jarred M.D.   On: 06/09/2017 06:58     ASSESSMENT AND PLAN:   76 y.o. male with a known history of BPH, Afib, CKD3, GERD, COPD, GERD, HLD, HTN,  PVD, CVA, seizures presents to the emergency department for evaluation of back pain s/p fall and found to have on chest x-ray bilateral large patchy areas of airspace disease.   1.  sepsis with leukocytosis and tachypnea due to Community-acquired pneumonia: Consider discontinue Levaquin and Zosyn if is son and  daughter agrees due to poor prognosis. negative blood cultures Dr. Benjie Karvonen added Zosyn to cover for postobstructive/aspiration pneumonia. Worsening leukocytosis at 35,600. Follow-up CBC.   2.  Large right hypodense thyroid mass is noted which is significantly increased in size compared to prior exam concerning for malignancy. This results in significant displacement of the trachea to the left. 3.2 cm left hilar lymph node is noted concerning for metastatic disease. Also noted are multiple pulmonary nodules in both lungs with the largest measuring approximately 8 cm in the right middle lobe. These are concerning for metastatic disease as well  This is been followed by ENT however metastatic disease was unknown. Patient was evaluated by palliative care and oncology. Family does not want more aggressive care.   3. Recent falls and back pain: Neurological workup  including MRI/MRA of the brain shows no acute pathology. He has known occlusion of the right carotid artery.  lumbar x-ray shows no acute fracture  4. History of CVA with left-sided neglect: Continue aspirin and statin  5. BPH: Continue finasteride and tamsulosin  6. Chronic kidney disease stage III: Creatinine at baseline  7. PAF: Continue diltiazem for heart rate control  8. Essential hypertension: Continue lisinopril and diltiazem  9. History of seizures: Continue Dilantin  10. COPD without exacerbation: Continue inhalers  Management plans discussed with the patient's son, they are in agreement.  CODE STATUS: FULL  TOTAL TIME TAKING CARE OF THIS PATIENT: 28 minutes.   Patient may be able to be discharged to skilled nursing facility with palliative care following his outpatient with possible hospice in the future.  POSSIBLE D/C 1-2 days, DEPENDING ON CLINICAL CONDITION.   Demetrios Loll M.D on 06/13/2017 at 1:07 PM  Between 7am to 6pm - Pager - [phone] After 6pm go to [email or web address] - password [email or web address] [facility]  [facility]  Office  [phone]  CC: Primary care physician; [place] Pink, MD  Note: This dictation was prepared with Dragon dictation along with smaller phrase technology. Any transcriptional errors that result from this process are unintentional.

## 2017-06-14 DIAGNOSIS — Z7189 Other specified counseling: Secondary | ICD-10-CM

## 2017-06-14 DIAGNOSIS — R4182 Altered mental status, unspecified: Secondary | ICD-10-CM

## 2017-06-14 DIAGNOSIS — R0602 Shortness of breath: Secondary | ICD-10-CM

## 2017-06-14 DIAGNOSIS — Z515 Encounter for palliative care: Secondary | ICD-10-CM

## 2017-06-14 LAB — CBC
HEMATOCRIT: 35.9 % — AB (ref 40.0–52.0)
HEMOGLOBIN: 12.4 g/dL — AB (ref 13.0–18.0)
MCH: 30.6 pg (ref 26.0–34.0)
MCHC: 34.6 g/dL (ref 32.0–36.0)
MCV: 88.4 fL (ref 80.0–100.0)
PLATELETS: 289 10*3/uL (ref 150–440)
RBC: 4.06 MIL/uL — AB (ref 4.40–5.90)
RDW: 13.2 % (ref 11.5–14.5)
WBC: 40.5 10*3/uL — ABNORMAL HIGH (ref 3.8–10.6)

## 2017-06-14 MED ORDER — MORPHINE SULFATE (CONCENTRATE) 10 MG/0.5ML PO SOLN: 5.0000 mg | ORAL | Status: AC | PRN

## 2017-06-14 MED ORDER — LORAZEPAM 2 MG/ML IJ SOLN: 1.0000 mg | INTRAMUSCULAR | 0 refills | Status: AC | PRN

## 2017-06-14 MED ORDER — LORAZEPAM 1 MG PO TABS: 1.0000 mg | ORAL_TABLET | ORAL | 0 refills | Status: AC | PRN

## 2017-06-14 MED ORDER — MORPHINE SULFATE (CONCENTRATE) 10 MG/0.5ML PO SOLN: 5.0000 mg | ORAL | Status: DC | PRN

## 2017-06-14 MED ORDER — GUAIFENESIN-DM 100-10 MG/5ML PO SYRP: 5.0000 mL | ORAL_SOLUTION | ORAL | 0 refills | Status: AC | PRN

## 2017-06-14 MED ORDER — GLYCOPYRROLATE 0.2 MG/ML IJ SOLN: 0.2000 mg | INTRAMUSCULAR | Status: AC | PRN

## 2017-06-14 MED ORDER — HALOPERIDOL 0.5 MG PO TABS
0.5000 mg | ORAL_TABLET | ORAL | Status: DC | PRN
  Filled 2017-06-14: qty 1

## 2017-06-14 MED ORDER — HALOPERIDOL 0.5 MG PO TABS: 0.5000 mg | ORAL_TABLET | ORAL | Status: AC | PRN

## 2017-06-14 MED ORDER — LORAZEPAM 2 MG/ML PO CONC: 1.0000 mg | ORAL | Status: DC | PRN

## 2017-06-14 MED ORDER — HALOPERIDOL LACTATE 2 MG/ML PO CONC: 0.5000 mg | ORAL | 0 refills | Status: AC | PRN

## 2017-06-14 MED ORDER — ONDANSETRON HCL 4 MG/2ML IJ SOLN: 4.0000 mg | Freq: Four times a day (QID) | INTRAMUSCULAR | 0 refills | Status: AC | PRN

## 2017-06-14 MED ORDER — ONDANSETRON HCL 4 MG/2ML IJ SOLN: 4.0000 mg | Freq: Four times a day (QID) | INTRAMUSCULAR | Status: DC | PRN

## 2017-06-14 MED ORDER — ONDANSETRON 4 MG PO TBDP: 4.0000 mg | ORAL_TABLET | Freq: Four times a day (QID) | ORAL | 0 refills | Status: AC | PRN

## 2017-06-14 MED ORDER — GLYCOPYRROLATE 1 MG PO TABS
1.0000 mg | ORAL_TABLET | ORAL | Status: DC | PRN
  Filled 2017-06-14: qty 1

## 2017-06-14 MED ORDER — HALOPERIDOL LACTATE 2 MG/ML PO CONC
0.5000 mg | ORAL | Status: DC | PRN
  Filled 2017-06-14: qty 0.3

## 2017-06-14 MED ORDER — GLYCOPYRROLATE 1 MG PO TABS: 1.0000 mg | ORAL_TABLET | ORAL | Status: AC | PRN

## 2017-06-14 MED ORDER — GLYCOPYRROLATE 0.2 MG/ML IJ SOLN
0.2000 mg | INTRAMUSCULAR | Status: DC | PRN
  Filled 2017-06-14: qty 1

## 2017-06-14 MED ORDER — HALOPERIDOL LACTATE 5 MG/ML IJ SOLN: 0.5000 mg | INTRAMUSCULAR | Status: AC | PRN

## 2017-06-14 MED ORDER — POLYVINYL ALCOHOL 1.4 % OP SOLN
1.0000 [drp] | Freq: Four times a day (QID) | OPHTHALMIC | Status: DC | PRN
  Filled 2017-06-14: qty 15

## 2017-06-14 MED ORDER — ONDANSETRON 4 MG PO TBDP
4.0000 mg | ORAL_TABLET | Freq: Four times a day (QID) | ORAL | Status: DC | PRN
  Filled 2017-06-14: qty 1

## 2017-06-14 MED ORDER — POLYVINYL ALCOHOL 1.4 % OP SOLN: 1.0000 [drp] | Freq: Four times a day (QID) | OPHTHALMIC | 0 refills | Status: AC | PRN

## 2017-06-14 MED ORDER — LORAZEPAM 1 MG PO TABS: 1.0000 mg | ORAL_TABLET | ORAL | Status: DC | PRN

## 2017-06-14 MED ORDER — LORAZEPAM 2 MG/ML PO CONC: 1.0000 mg | ORAL | 0 refills | Status: AC | PRN

## 2017-06-14 MED ORDER — BIOTENE DRY MOUTH MT LIQD: 15.0000 mL | OROMUCOSAL | Status: DC | PRN

## 2017-06-14 MED ORDER — OXYCODONE HCL 5 MG PO TABS: 5.0000 mg | ORAL_TABLET | Freq: Four times a day (QID) | ORAL | 0 refills | Status: AC | PRN

## 2017-06-14 MED ORDER — BIOTENE DRY MOUTH MT LIQD: 15.0000 mL | OROMUCOSAL | Status: AC | PRN

## 2017-06-14 MED ORDER — LORAZEPAM 2 MG/ML IJ SOLN: 1.0000 mg | INTRAMUSCULAR | Status: DC | PRN

## 2017-06-14 MED ORDER — HALOPERIDOL LACTATE 5 MG/ML IJ SOLN: 0.5000 mg | INTRAMUSCULAR | Status: DC | PRN

## 2017-06-14 NOTE — Progress Notes (Signed)
Pt has been discharged to hospice home. AVS placed in discharge packet. Transported by EMS.

## 2017-06-14 NOTE — Discharge Summary (Addendum)
[place] [facility] [place] NAME: Ian Townsend    MR#:  [id number]  DATE OF BIRTH:  1941/09/18  DATE OF ADMISSION:  06/08/2017   ADMITTING PHYSICIAN: Harvie Bridge, DO  DATE OF DISCHARGE: 06/14/2017  PRIMARY CARE PHYSICIAN: Maryland Pink, MD   ADMISSION DIAGNOSIS:  Lung mass [R91.8] Fall, initial encounter [W19.XXXA] DISCHARGE DIAGNOSIS:  Active Problems:   Community acquired pneumonia   Thyroid mass   Pulmonary nodules   Fall   Palliative care by specialist   DNR (do not resuscitate)  SECONDARY DIAGNOSIS:   Past Medical History:  Diagnosis Date  . Benign prostatic hypertrophy   . Broken arm    left  . Chronic atrial fibrillation (HCC)    a. not on long term anticoagulation 2/2 high risk fall and multiple falls  . Chronic kidney disease    a. stage III; b. baseline SCr approx 1.50  . COPD (chronic obstructive pulmonary disease) ([place])   . Esophageal reflux   . Hyperlipidemia   . Hypertension   . 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 Mitral regurgitation    a. echo 2013: EF 65%, mod MR/TR, LA 4.4 cm, mod dilated RA  . 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 PVD (peripheral vascular disease) ([place])   . Seasonal allergies   . Seizures ([place])    a. at time of last stroke  . Stroke [facility])    a. x2, last approx 2011  . Syncope   . Unresponsiveness 02/08/2012   HOSPITAL COURSE:   76 y.o.malewith a known history of BPH, Afib, CKD3, GERD, COPD, GERD, HLD, HTN, PVD, CVA, seizures presents to the emergency department for evaluation of back pain s/p fall and found to have on chest x-ray bilateral large patchy areas of airspace disease.   1.  sepsis with leukocytosis and tachypnea due to Community-acquired pneumonia: He has been treated with Levaquin and Zosyn, but leukocytosis (WBC 40,500) is worsening.  His son and daughter don't want to get further workup for leukocytosis and agree to discontinue  antibiotics. negative blood cultures. Hypoxia due to pneumonia. SAT down to 85% in room air this am. The patient needs [place] oxygen 2 L.  2.  Large right hypodense thyroid mass is noted which is significantly increased in size compared to prior exam concerning for malignancy. This results in significant displacement of the trachea to the left. 3.2 cm left hilar lymph node is noted concerning for metastatic disease. Also noted are multiple pulmonary nodules in both lungs with the largest measuring approximately 8 cm in the right middle lobe. These are concerning for metastatic disease as well  This is been followed by ENT however metastatic disease was unknown. Patient was evaluated by palliative care and oncology. Family does not want more aggressive care.   3. Recent falls and back pain: Neurological workup including MRI/MRA of the brain shows no acute pathology. He has known occlusion of the right carotid artery.  lumbar x-ray shows no acute fracture  4. History of CVA with left-sided neglect: on aspirin and statin  5. BPH: on finasteride and tamsulosin  6. Chronic kidney disease stage III: Creatinine at baseline  7. PAF: Continue diltiazem for heart rate control  8. Essential hypertension: on lisinopril and diltiazem  9. History of seizures: on Dilantin  10. COPD without exacerbation: on inhalers  Poor oral intake. Generally the patient has very poor prognosis.  Per Palliative care team put he patient to comfort care and he will be discharged to hospice home. DISCHARGE CONDITIONS:  Poor prognosis, discharge to hospice home today. CONSULTS OBTAINED:  Treatment Team:  Leotis Pain, MD Cammie Sickle, MD DRUG ALLERGIES:   Allergies  Allergen Reactions  . Penicillins Hives        DISCHARGE MEDICATIONS:   Allergies as of 06/14/2017      Reactions   Penicillins Hives          Medication List    STOP taking these medications   acetaminophen 325 MG  tablet Commonly known as:  TYLENOL   albuterol 108 (90 Base) MCG/ACT inhaler Commonly known as:  PROVENTIL HFA;VENTOLIN HFA   ASPIRIN LOW DOSE 81 MG EC tablet Generic drug:  aspirin   atorvastatin 20 MG tablet Commonly known as:  LIPITOR   diltiazem 120 MG 24 hr capsule Commonly known as:  CARDIZEM CD   finasteride 5 MG tablet Commonly known as:  PROSCAR   FLUoxetine 20 MG tablet Commonly known as:  PROZAC   Fluticasone-Salmeterol 250-50 MCG/DOSE Aepb Commonly known as:  ADVAIR   gabapentin 100 MG capsule Commonly known as:  NEURONTIN   lisinopril 20 MG tablet Commonly known as:  PRINIVIL,ZESTRIL   Melatonin 5 MG Tabs   omeprazole 20 MG capsule Commonly known as:  PRILOSEC   phenytoin 100 MG ER capsule Commonly known as:  DILANTIN   PHENYTOIN INFATABS 50 MG tablet Generic drug:  phenytoin   polyethylene glycol packet Commonly known as:  MIRALAX / GLYCOLAX   solifenacin 10 MG tablet Commonly known as:  VESICARE   tamsulosin 0.4 MG Caps capsule Commonly known as:  FLOMAX   tiotropium 18 MCG inhalation capsule Commonly known as:  SPIRIVA   Vitamin D-3 1000 units Caps     TAKE these medications   antiseptic oral rinse Liqd Apply 15 mLs topically as needed for dry mouth.   glycopyrrolate 1 MG tablet Commonly known as:  ROBINUL Take 1 tablet (1 mg total) by mouth every 4 (four) hours as needed (excessive secretions).   glycopyrrolate 0.2 MG/ML injection Commonly known as:  ROBINUL Inject 1 mL (0.2 mg total) into the skin every 4 (four) hours as needed (excessive secretions).   glycopyrrolate 0.2 MG/ML injection Commonly known as:  ROBINUL Inject 1 mL (0.2 mg total) into the vein every 4 (four) hours as needed (excessive secretions).   guaiFENesin-dextromethorphan 100-10 MG/5ML syrup Commonly known as:  ROBITUSSIN DM Take 5 mLs by mouth every 4 (four) hours as needed for cough.   haloperidol 0.5 MG tablet Commonly known as:  HALDOL Take 1 tablet  (0.5 mg total) by mouth every 4 (four) hours as needed for agitation (or delirium).   haloperidol 2 MG/ML solution Commonly known as:  HALDOL Place 0.3 mLs (0.6 mg total) under the tongue every 4 (four) hours as needed for agitation (or delirium).   haloperidol lactate 5 MG/ML injection Commonly known as:  HALDOL Inject 0.1 mLs (0.5 mg total) into the vein every 4 (four) hours as needed (or delirium).   LORazepam 1 MG tablet Commonly known as:  ATIVAN Take 1 tablet (1 mg total) by mouth every 4 (four) hours as needed for anxiety.   LORazepam 2 MG/ML concentrated solution Commonly known as:  ATIVAN Place 0.5 mLs (1 mg total) under the tongue every 4 (four) hours as needed for anxiety.   LORazepam 2 MG/ML injection Commonly known as:  ATIVAN Inject 0.5 mLs (1  mg total) into the vein every 4 (four) hours as needed for anxiety.   morphine CONCENTRATE 10 MG/0.5ML Soln concentrated solution Take 0.25 mLs (5 mg total) by mouth every 2 (two) hours as needed for moderate pain (or dyspnea).   morphine CONCENTRATE 10 MG/0.5ML Soln concentrated solution Place 0.25 mLs (5 mg total) under the tongue every 2 (two) hours as needed for moderate pain (or dyspnea).   ondansetron 4 MG disintegrating tablet Commonly known as:  ZOFRAN-ODT Take 1 tablet (4 mg total) by mouth every 6 (six) hours as needed for nausea.   ondansetron 4 MG/2ML Soln injection Commonly known as:  ZOFRAN Inject 2 mLs (4 mg total) into the vein every 6 (six) hours as needed for nausea.   oxyCODONE 5 MG immediate release tablet Commonly known as:  Oxy IR/ROXICODONE Take 1 tablet (5 mg total) by mouth every 6 (six) hours as needed for moderate pain.   polyvinyl alcohol 1.4 % ophthalmic solution Commonly known as:  LIQUIFILM TEARS Place 1 drop into both eyes 4 (four) times daily as needed for dry eyes.            Discharge Care Instructions        Start     Ordered   06/14/17 0000  Increase activity slowly      06/14/17 1055   06/14/17 0000  guaiFENesin-dextromethorphan (ROBITUSSIN DM) 100-10 MG/5ML syrup  Every 4 hours PRN     06/14/17 1107   06/14/17 0000  oxyCODONE (OXY IR/ROXICODONE) 5 MG immediate release tablet  Every 6 hours PRN     06/14/17 1107   06/14/17 0000  Increase activity slowly     06/14/17 1705   06/14/17 0000  haloperidol (HALDOL) 0.5 MG tablet  Every 4 hours PRN     06/14/17 1715   06/14/17 0000  ondansetron (ZOFRAN-ODT) 4 MG disintegrating tablet  Every 6 hours PRN     06/14/17 1715   06/14/17 0000  glycopyrrolate (ROBINUL) 1 MG tablet  Every 4 hours PRN     06/14/17 1715   06/14/17 0000  antiseptic oral rinse (BIOTENE) LIQD  As needed     06/14/17 1715   06/14/17 0000  polyvinyl alcohol (LIQUIFILM TEARS) 1.4 % ophthalmic solution  4 times daily PRN     06/14/17 1715   06/14/17 0000  Morphine Sulfate (MORPHINE CONCENTRATE) 10 MG/0.5ML SOLN concentrated solution  Every 2 hours PRN     06/14/17 1715   06/14/17 0000  LORazepam (ATIVAN) 1 MG tablet  Every 4 hours PRN     06/14/17 1715   06/14/17 0000  glycopyrrolate (ROBINUL) 0.2 MG/ML injection  Every 4 hours PRN     06/14/17 1715   06/14/17 0000  haloperidol (HALDOL) 2 MG/ML solution  Every 4 hours PRN     06/14/17 1715   06/14/17 0000  haloperidol lactate (HALDOL) 5 MG/ML injection  Every 4 hours PRN     06/14/17 1715   06/14/17 0000  LORazepam (ATIVAN) 2 MG/ML concentrated solution  Every 4 hours PRN     06/14/17 1715   06/14/17 0000  Morphine Sulfate (MORPHINE CONCENTRATE) 10 MG/0.5ML SOLN concentrated solution  Every 2 hours PRN     06/14/17 1715   06/14/17 0000  ondansetron (ZOFRAN) 4 MG/2ML SOLN injection  Every 6 hours PRN     06/14/17 1715   06/14/17 0000  LORazepam (ATIVAN) 2 MG/ML injection  Every 4 hours PRN     06/14/17 1715   06/14/17 0000  glycopyrrolate (ROBINUL) 0.2 MG/ML injection  Every 4 hours PRN     06/14/17 1715       DISCHARGE INSTRUCTIONS:  See AVS.  If you experience worsening of your  admission symptoms, develop shortness of breath, life threatening emergency, suicidal or homicidal thoughts you must seek medical attention immediately by calling 911 or calling your MD immediately  if symptoms less severe.  You Must read complete instructions/literature along with all the possible adverse reactions/side effects for all the Medicines you take and that have been prescribed to you. Take any new Medicines after you have completely understood and accpet all the possible adverse reactions/side effects.   Please note  You were cared for by a hospitalist during your hospital stay. If you have any questions about your discharge medications or the care you received while you were in the hospital after you are discharged, you can call the unit and asked to speak with the hospitalist on call if the hospitalist that took care of you is not available. Once you are discharged, your primary care physician will handle any further medical issues. Please note that NO REFILLS for any discharge medications will be authorized once you are discharged, as it is imperative that you return to your primary care physician (or establish a relationship with a primary care physician if you do not have one) for your aftercare needs so that they can reassess your need for medications and monitor your lab values.    On the day of Discharge:  VITAL SIGNS:  Blood pressure (!) 173/67, pulse 95, temperature 98 F (36.7 C), temperature source Oral, resp. rate 20, height 5\' 6"  (1.676 m), weight 140 lb (63.5 kg), SpO2 91 %. PHYSICAL EXAMINATION:  GENERAL:  76 y.o.-year-old patient lying in the bed with no acute distress.  EYES: Pupils equal, round, reactive to light and accommodation. No scleral icterus. Extraocular muscles intact.  HEENT: Head atraumatic, normocephalic.  NECK:  Supple, no jugular venous distention. No thyroid enlargement, no tenderness.  LUNGS: Normal breath sounds bilaterally, no wheezing,  rales,rhonchi or crepitation. No use of accessory muscles of respiration.  CARDIOVASCULAR: S1, S2 normal. No murmurs, rubs, or gallops.  ABDOMEN: Soft, non-tender, non-distended. Bowel sounds present. No organomegaly or mass.  EXTREMITIES: No pedal edema, cyanosis, or clubbing.  NEUROLOGIC: Cranial nerves II through XII are intact. Muscle strength 3/5 in all extremities. Sensation intact. Gait not checked.  PSYCHIATRIC: The patient is confused. SKIN: No obvious rash, lesion, or ulcer.  DATA REVIEW:   CBC  Recent Labs Lab 06/14/17 0359  WBC 40.5*  HGB 12.4*  HCT 35.9*  PLT 289    Chemistries   Recent Labs Lab 06/10/17 0410 06/12/17 0415  NA 137  --   K 3.9  --   CL 104  --   CO2 25  --   GLUCOSE 172*  --   BUN 16  --   CREATININE 1.34* 1.71*  CALCIUM 10.4*  --      Microbiology Results  Results for orders placed or performed during the hospital encounter of 06/08/17  Culture, blood (routine x 2)     Status: None   Collection Time: 06/08/17  9:12 PM  Result Value Ref Range Status   Specimen Description BLOOD RAC  Final   Special Requests   Final    BOTTLES DRAWN AEROBIC AND ANAEROBIC Blood Culture adequate volume   Culture NO GROWTH 5 DAYS  Final   Report Status 06/13/2017 FINAL  Final  Culture, blood (  routine x 2)     Status: None   Collection Time: 06/08/17  9:12 PM  Result Value Ref Range Status   Specimen Description BLOOD LFOA  Final   Special Requests   Final    BOTTLES DRAWN AEROBIC AND ANAEROBIC Blood Culture adequate volume   Culture NO GROWTH 5 DAYS  Final   Report Status 06/13/2017 FINAL  Final  Culture, sputum-assessment     Status: None   Collection Time: 06/09/17  4:17 AM  Result Value Ref Range Status   Specimen Description EXPECTORATED SPUTUM  Final   Special Requests Normal  Final   Sputum evaluation THIS SPECIMEN IS ACCEPTABLE FOR SPUTUM CULTURE  Final   Report Status 06/09/2017 FINAL  Final  MRSA PCR Screening     Status: None    Collection Time: 06/09/17  4:17 AM  Result Value Ref Range Status   MRSA by PCR NEGATIVE NEGATIVE Final    Comment:        The GeneXpert MRSA Assay (FDA approved for NASAL specimens only), is one component of a comprehensive MRSA colonization surveillance program. It is not intended to diagnose MRSA infection nor to guide or monitor treatment for MRSA infections.   Culture, respiratory (NON-Expectorated)     Status: None   Collection Time: 06/09/17  4:17 AM  Result Value Ref Range Status   Specimen Description EXPECTORATED SPUTUM  Final   Special Requests Normal Reflexed from [id number]  Final   Gram Stain   Final    RARE WBC PRESENT, PREDOMINANTLY PMN NO ORGANISMS SEEN    Culture   Final    RARE GROUP B STREP(S.AGALACTIAE)ISOLATED TESTING AGAINST S. AGALACTIAE NOT ROUTINELY PERFORMED DUE TO PREDICTABILITY OF AMP/PEN/VAN SUSCEPTIBILITY. Performed at [facility], [place] [address] [place], [place] [address]    Report Status 06/11/2017 FINAL  Final    RADIOLOGY:  No results found.   Management plans discussed with the patient, His son and daughter and they are in agreement.  CODE STATUS: DNR   TOTAL TIME TAKING CARE OF THIS PATIENT: 47 minutes.    Demetrios Loll M.D on 06/14/2017 at 2:22 PM  Between 7am to 6pm - Pager - [phone]  After 6pm go to [email or web address] - Proofreader  [facility]  Office  [phone]  CC: Primary care physician; Maryland Pink, MD   Note: This dictation was prepared with Dragon dictation along with smaller phrase technology. Any transcriptional errors that result from this process are unintentional.

## 2017-06-14 NOTE — Care Management Important Message (Signed)
Important Message  Patient Details  Name: Ian Townsend MRN: [id number] Date of Birth: Nov 02, 1940   Medicare Important Message Given:  Yes    Shelbie Ammons, RN 06/14/2017, 8:15 AM

## 2017-06-14 NOTE — Progress Notes (Signed)
New hospice home referral received from [facility]. Patient was originally a referral for Palliative to follow at SNF, but has had further decline over the weekend. He is a 76 year old man with a  past medical history of CVA, seizures, PVD, mitral regurgitation, HTN, HLD, GERD, COPD, CKD, afib, BPH, and admitted on 06/08/2017 with back pain after a fall. In ED, he was found to have an elevated white count. Chest xray revealed bilateral airspace consolidation versus pulmonary masses. CT revealed large thyroid mass with tracheal deviation and multiple pulmonary nodules concerning for metastases. Followed by ENT for thyroid nodule since 2013. Initiated on antibiotics for community-acquired pneumonia.  Xray left spine revealed degenerative disk disease. Palliative medicine was reconsulted today and NP Mariana Kaufman met with patient's son Ian Townsend and daughter Ian Townsend, they have chosen to focus on comfort with transfer to the hospice home today. Patient seen lying in bed, appeared to have increased work of breathing, oxygen off, patient declined having it replaced. Writer initiated education regarding hospice services, philosophy and team approach to care wth good understanding voiced. They are very familiar with services as they have had other family members and friends to receive hospice. Questions answered, consents signed. Report called to the hospice home. EMS notified for transport. Signe DNR in place in patient's discharge packet. Hospital care team all aware and in agreement with discharge plan. Thank you Flo Shanks RN, BSN< [facility], hospital Liaison [phone] c

## 2017-06-14 NOTE — Clinical Social Work Note (Signed)
Palliative met with patient and his family they are recommending patient go to hospice facility.  CSW was informed that patient's family would like [facility].  CSW notified nurse liaison from the hospice home, she will review patient's information and determine if bed is available.  CSW continuing to follow patient's progress throughout discharge planning.  Jones Broom. [place], MSW, [place]  06/14/2017 3:23 PM

## 2017-06-14 NOTE — Plan of Care (Signed)
Problem: Safety: Goal: Ability to remain free from injury will improve Patient is high fall risk. Yellow arm band and yellow socks are on patient. Bed alarm is on.

## 2017-06-14 NOTE — Progress Notes (Signed)
Daily Progress Note   Patient Name: Ian Townsend       Date: 06/14/2017 DOB: 11-02-1940  Age: 76 y.o. MRN#: [id number] Attending Physician: Ian Loll, MD Primary Care Physician: Ian Pink, MD Admit Date: 06/08/2017  Reason for Consultation/Follow-up: Establishing goals of care  Subjective: Patient in bed, children at bedside. Patient pulling off oxygen, pulling at sheets. Per primary MD note- patient's white count has continued to rise despite antibiotics- therefore antibiotic treatment for pneumonia has been discontinued.  Patient has declined significantly over the weekend. He has stopped eating and drinking. He is confused. He is not verbalizing. He is unable to stand on his own and is not ambulating. His oxygen saturation is in the 80's on room air. He is visibly SOB, and agitated in bed- there are audible unmanaged airway secretions. Family is requesting transition in care to comfort measures only. They are realistic that patient is not able to participate in rehab and that his recovery at this point is unlikely.    Review of Systems  Unable to perform ROS: Dementia    Length of Stay: 6  Current Medications: Scheduled Meds:    Continuous Infusions:   PRN Meds: ALPRAZolam, oxyCODONE  Physical Exam  Constitutional:  Cachetic, frail, elderly   Cardiovascular:  tachycardic  Pulmonary/Chest:  Audible secretions, labored at times  Neurological:  Lethargic, opens eyes to voice, somewhat agitated, pulling at oxygen and sheets  Skin: Skin is warm. There is pallor.  Nursing note and vitals reviewed.           Vital Signs: BP (!) 173/67 (BP Location: Right Arm)   Pulse 95   Temp 98 F (36.7 C) (Oral)   Resp 20   Ht 5\' 6"  (1.676 m)   Wt 63.5 kg (140 lb)   SpO2 91%    BMI 22.60 kg/m  SpO2: SpO2: 91 % O2 Device: O2 Device: Nasal Cannula O2 Flow Rate: O2 Flow Rate (L/min): 2 L/min  Intake/output summary:  Intake/Output Summary (Last 24 hours) at 06/14/17 1505 Last data filed at 06/14/17 0326  Gross per 24 hour  Intake              100 ml  Output                0 ml  Net  100 ml   LBM: Last BM Date: 06/14/17 Baseline Weight: Weight: 63.5 kg (140 lb) Most recent weight: Weight: 63.5 kg (140 lb)       Palliative Assessment/Data: PPS: 20%    Flowsheet Rows     Most Recent Value  Intake Tab  Referral Department  Hospitalist  Unit at Time of Referral  Oncology Unit  Palliative Care Primary Diagnosis  Cancer  Palliative Care Type  New Palliative care  Reason for referral  Clarify Goals of Care  Date first seen by Palliative Care  06/10/17  Clinical Assessment  Palliative Performance Scale Score  50%  Psychosocial & Spiritual Assessment  Palliative Care Outcomes  Patient/Family meeting held?  Yes  Who was at the meeting?  patient, daughter, son  Palliative Care Outcomes  Clarified goals of care, Counseled regarding hospice, Provided psychosocial or spiritual support, ACP counseling assistance, Provided end of life care assistance, Provided advance care planning      Patient Active Problem List   Diagnosis Date Noted  . Thyroid mass   . Pulmonary nodules   . Fall   . Palliative care by specialist   . DNR (do not resuscitate)   . Community acquired pneumonia 06/08/2017  . Chronic atrial fibrillation ([place]) 04/11/2015  . Acute on chronic respiratory failure with hypoxemia ([place]) 03/13/2015  . Atrial fibrillation with RVR ([place])   . COPD with acute exacerbation ([place])   . Hypertension   . Goals of care, counseling/discussion 10/21/2012  . SSS (sick sinus syndrome) ([place]) 03/29/2012  . Syncope 03/29/2012  . Dizziness 03/29/2012  . Bilateral inguinal hernia 06/11/2011    Palliative Care Assessment & Plan   Patient  Profile: 76 y.o. male  with past medical history of CVA, seizures, PVD, mitral regurgitation, HTN, HLD, GERD, COPD, CKD, afib, BPH, and smoker admitted on 06/08/2017 with back pain after a fall. In ED, elevated WBC's. Chest xray revealed bilateral airspace consolidation versus pulmonary masses. CT revealed large thyroid mass with tracheal deviation and multiple pulmonary nodules concerning for metastases. Followed by ENT for thyroid nodule since 2013. MRI/MRA brain negative and no RCA occlusion. Initiated on antibiotics for community-acquired pneumonia. Patient complaining of back pain. Xray left spine revealed degenerative disk disease. Neurology recommending pain control, muscle relaxant, and physical therapy. 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 Not a surgical candidate. Palliative medicine consultation for goals of care.   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  Thank you for allowing the Palliative Medicine Team to assist in the care of this patient.   Time In: 1424 Time Out: 1525 Total Time 60 min Prolonged Time Billed Yes      Greater than 50%  of this time was spent counseling and coordinating care related to the above assessment and plan.  Ian Townsend, AGNP-C Palliative Medicine   Please  contact Palliative Medicine Team phone at [phone] for questions and concerns.

## 2017-06-14 NOTE — Clinical Social Work Note (Signed)
Patient to be d/c'ed today to [facility]. Patient and family agreeable to plans will transport via [facility] hospice home liaison RN to call report.  Evette Cristal, MSW, [place]

## 2017-06-19 DEATH — deceased
